# Patient Record
Sex: Female | Born: 1965 | Race: Black or African American | Hispanic: No | Smoking: Never smoker
Health system: Southern US, Community
[De-identification: ages and names within clinical notes are randomized; demographics above are authoritative.]

## PROBLEM LIST (undated history)

## (undated) ENCOUNTER — Ambulatory Visit: Disposition: A | Discharge status: Home / Self Care | Payer: Medicare HMO

## (undated) DIAGNOSIS — F32A Depression, unspecified: Secondary | ICD-10-CM

## (undated) DIAGNOSIS — T8859XA Other complications of anesthesia, initial encounter: Secondary | ICD-10-CM

## (undated) DIAGNOSIS — Z923 Personal history of irradiation: Secondary | ICD-10-CM

## (undated) DIAGNOSIS — K219 Gastro-esophageal reflux disease without esophagitis: Secondary | ICD-10-CM

## (undated) DIAGNOSIS — IMO0002 Reserved for concepts with insufficient information to code with codable children: Secondary | ICD-10-CM

## (undated) DIAGNOSIS — C539 Malignant neoplasm of cervix uteri, unspecified: Secondary | ICD-10-CM

## (undated) DIAGNOSIS — M329 Systemic lupus erythematosus, unspecified: Secondary | ICD-10-CM

## (undated) DIAGNOSIS — C50919 Malignant neoplasm of unspecified site of unspecified female breast: Secondary | ICD-10-CM

## (undated) DIAGNOSIS — M1990 Unspecified osteoarthritis, unspecified site: Secondary | ICD-10-CM

## (undated) DIAGNOSIS — R7303 Prediabetes: Secondary | ICD-10-CM

## (undated) DIAGNOSIS — I10 Essential (primary) hypertension: Secondary | ICD-10-CM

## (undated) DIAGNOSIS — C801 Malignant (primary) neoplasm, unspecified: Secondary | ICD-10-CM

## (undated) DIAGNOSIS — J45909 Unspecified asthma, uncomplicated: Secondary | ICD-10-CM

## (undated) DIAGNOSIS — F419 Anxiety disorder, unspecified: Secondary | ICD-10-CM

## (undated) DIAGNOSIS — T7840XA Allergy, unspecified, initial encounter: Secondary | ICD-10-CM

## (undated) HISTORY — PX: ABDOMINAL HYSTERECTOMY: SHX81

## (undated) HISTORY — PX: CHOLECYSTECTOMY: SHX55

## (undated) HISTORY — PX: REPLACEMENT TOTAL KNEE BILATERAL: SUR1225

## (undated) HISTORY — DX: Malignant neoplasm of unspecified site of unspecified female breast: C50.919

## (undated) HISTORY — PX: JOINT REPLACEMENT: SHX530

## (undated) HISTORY — PX: LAPAROSCOPIC GASTRIC BAND REMOVAL WITH LAPAROSCOPIC GASTRIC SLEEVE RESECTION: SHX6498

## (undated) HISTORY — DX: Essential (primary) hypertension: I10

## (undated) HISTORY — PX: TUBAL LIGATION: SHX77

## (undated) HISTORY — DX: Malignant neoplasm of cervix uteri, unspecified: C53.9

## (undated) HISTORY — DX: Allergy, unspecified, initial encounter: T78.40XA

## (undated) HISTORY — PX: COLONOSCOPY WITH ESOPHAGOGASTRODUODENOSCOPY (EGD): SHX5779

---

## 2016-06-28 DIAGNOSIS — G8929 Other chronic pain: Secondary | ICD-10-CM | POA: Insufficient documentation

## 2016-06-28 DIAGNOSIS — Z96653 Presence of artificial knee joint, bilateral: Secondary | ICD-10-CM | POA: Insufficient documentation

## 2016-06-28 DIAGNOSIS — R899 Unspecified abnormal finding in specimens from other organs, systems and tissues: Secondary | ICD-10-CM | POA: Insufficient documentation

## 2017-02-10 ENCOUNTER — Emergency Department: Payer: Medicare Other

## 2017-02-10 ENCOUNTER — Emergency Department
Admission: EM | Admit: 2017-02-10 | Discharge: 2017-02-10 | Disposition: A | Discharge status: Home / Self Care | Payer: Medicare Other | Attending: Emergency Medicine | Admitting: Emergency Medicine

## 2017-02-10 ENCOUNTER — Encounter: Payer: Self-pay | Admitting: Medical Oncology

## 2017-02-10 DIAGNOSIS — R51 Headache: Secondary | ICD-10-CM

## 2017-02-10 DIAGNOSIS — R519 Headache, unspecified: Secondary | ICD-10-CM

## 2017-02-10 DIAGNOSIS — Z79899 Other long term (current) drug therapy: Secondary | ICD-10-CM | POA: Insufficient documentation

## 2017-02-10 DIAGNOSIS — Z7982 Long term (current) use of aspirin: Secondary | ICD-10-CM | POA: Insufficient documentation

## 2017-02-10 DIAGNOSIS — I10 Essential (primary) hypertension: Secondary | ICD-10-CM | POA: Insufficient documentation

## 2017-02-10 LAB — BASIC METABOLIC PANEL
ANION GAP: 6 (ref 5–15)
BUN: 13 mg/dL (ref 6–20)
CALCIUM: 9.1 mg/dL (ref 8.9–10.3)
CO2: 31 mmol/L (ref 22–32)
Chloride: 103 mmol/L (ref 101–111)
Creatinine, Ser: 0.81 mg/dL (ref 0.44–1.00)
GFR calc Af Amer: >60 mL/min (ref >60)
GLUCOSE: 129 mg/dL — AB (ref 65–99)
POTASSIUM: 3.2 mmol/L — AB (ref 3.5–5.1)
SODIUM: 140 mmol/L (ref 135–145)

## 2017-02-10 LAB — CBC WITH DIFFERENTIAL/PLATELET
BASOS ABS: 0.1 10*3/uL (ref 0–0.1)
Basophils Relative: 1 %
EOS PCT: 3 %
Eosinophils Absolute: 0.2 10*3/uL (ref 0–0.7)
HCT: 38.1 % (ref 35.0–47.0)
Hemoglobin: 13.2 g/dL (ref 12.0–16.0)
Lymphocytes Relative: 46 %
Lymphs Abs: 2.9 10*3/uL (ref 1.0–3.6)
MCH: 30 pg (ref 26.0–34.0)
MCHC: 34.7 g/dL (ref 32.0–36.0)
MCV: 86.4 fL (ref 80.0–100.0)
MONO ABS: 0.4 10*3/uL (ref 0.2–0.9)
Monocytes Relative: 6 %
NEUTROS ABS: 2.8 10*3/uL (ref 1.4–6.5)
Neutrophils Relative %: 44 %
Platelets: 256 10*3/uL (ref 150–440)
RBC: 4.42 MIL/uL (ref 3.80–5.20)
RDW: 13.9 % (ref 11.5–14.5)
WBC: 6.3 10*3/uL (ref 3.6–11.0)

## 2017-02-10 LAB — TROPONIN I: Troponin I: <0.03 ng/mL (ref <0.03)

## 2017-02-10 MED ORDER — DIAZEPAM 5 MG PO TABS: 5.0000 mg | ORAL_TABLET | Freq: Once | ORAL | Status: DC

## 2017-02-10 MED ORDER — AMLODIPINE BESYLATE 5 MG PO TABS
5.0000 mg | ORAL_TABLET | Freq: Once | ORAL | Status: AC
Start: 2017-02-10 — End: 2017-02-10
  Administered 2017-02-10: 5 mg via ORAL
  Filled 2017-02-10: qty 1

## 2017-02-10 MED ORDER — AMLODIPINE BESYLATE 5 MG PO TABS: 5.0000 mg | ORAL_TABLET | Freq: Every day | ORAL | 0 refills | Status: DC

## 2017-02-10 NOTE — ED Notes (Signed)
This RN to bedside at this time to introduce self to patient. Pt is noted to be lying in bed with lights off. Pt is noted to be sinus brady on the monitor. Pt is noted to be alert and oriented, respirations even and unlabored, skin warm, dry, and intact. Pt requests warm blankets, which this RN provided to patient. Pt denies any further needs, will continue to monitor for further needs.

## 2017-02-10 NOTE — ED Notes (Addendum)
Pt states she is having tightness in her head and that she is currently having arthritic pain. Pt has no vision changes with headache at this time.

## 2017-02-10 NOTE — ED Notes (Signed)
NAD noted at time of D/C. Pt denies questions or concerns. Pt ambulatory to the lobby at this time.  

## 2017-02-10 NOTE — ED Notes (Signed)
Dr. Clearnce Hasten at bedside at this time. Will continue to monitor for further patient needs.

## 2017-02-10 NOTE — ED Notes (Addendum)
Patient states that she is new to the area and has an upcoming visit with a new PCP. Patient is on  Lasix 40, amlodipine 10, metoprolol 50, losartan 50.   Patient states that she has been out one of the medication (metoprolol).

## 2017-02-10 NOTE — ED Triage Notes (Signed)
Pt reports for the past few days she has been feeling "off" and like her BP was high. Pt reports home BP's have been 180's/100's. Pt reports headache.

## 2017-02-10 NOTE — ED Provider Notes (Signed)
[facility] Emergency Department Provider Note  ____________________________________________   None    (approximate)  I have reviewed the triage vital signs and the nursing notes.   HISTORY  Chief Complaint Headache and Hypertension   HPI Heather Mcintyre is a 51 y.o. female with a history of hypertension who is presenting to the emergency department with several days of intermittent headache as well as right shoulder cramping that started this morning. She denies any shortness of breath. Says that she is also been very stressed and has had similar symptoms in the past when she has been stressed as well as with high blood pressure. She says that she has been off her amlodipine for about 1 month now. She has recently moved and has not established primary care in the area yet. 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 She denies any chest pain or shortness of breath. Says that she was taking her blood pressure medication at home and was in the 180s over 100s.  Patient says the headache is now gone. However, one was present she said it wrapped from the back of her head around to the sides of her head bilaterally. No sudden onset or thunderclap quality. Does not report any weakness or numbness.   History reviewed. No pertinent past medical history.  There are no active problems to display for this patient.   History reviewed. No pertinent surgical history.  Prior to Admission medications   Medication Sig Start Date End Date Taking? Authorizing Provider  acetaminophen (TYLENOL) 500 MG tablet Take 500 mg by mouth every 6 (six) hours as needed.   Yes Historical Provider, MD  amLODipine (NORVASC) 10 MG tablet Take 10 mg by mouth daily.   Yes Historical Provider, MD  aspirin EC 81 MG tablet Take 81 mg by mouth daily.   Yes Historical Provider, MD  cetirizine (ZYRTEC) 10 MG  tablet Take 10 mg by mouth daily.   Yes Historical Provider, MD  diphenhydrAMINE (BENADRYL) 25 mg capsule Take 25 mg by mouth every 6 (six) hours as needed.   Yes Historical Provider, MD  fluticasone (FLONASE) 50 MCG/ACT nasal spray Place 1 spray into both nostrils daily.   Yes Historical Provider, MD  furosemide (LASIX) 40 MG tablet Take 40 mg by mouth daily as needed.   Yes Historical Provider, MD  gabapentin (NEURONTIN) 300 MG capsule Take 300 mg by mouth at bedtime. 06/28/16  Yes Historical Provider, MD  linaclotide (LINZESS) 290 MCG CAPS capsule Take 290 mcg by mouth daily before breakfast.   Yes Historical Provider, MD  losartan (COZAAR) 50 MG tablet Take 50 mg by mouth daily.   Yes Historical Provider, MD  metoprolol succinate (TOPROL-XL) 50 MG 24 hr tablet Take 50 mg by mouth daily. Take with or immediately following a meal.   Yes Historical Provider, MD  montelukast (SINGULAIR) 10 MG tablet Take 10 mg by mouth at bedtime.   Yes Historical Provider, MD  multivitamin-lutein (OCUVITE-LUTEIN) CAPS capsule Take 1 capsule by mouth daily.   Yes Historical Provider, MD  Psyllium (EQ DAILY FIBER PO) Take 1 tablet by mouth daily.   Yes Historical Provider, MD  tiZANidine (ZANAFLEX) 4 MG tablet Take 4 mg by mouth every 8 (eight) hours as needed for muscle spasms.   Yes Historical Provider, MD  traZODone (DESYREL) 100 MG tablet Take 100-300 mg by mouth at bedtime.  Yes Historical Provider, MD    Allergies Contrast media [iodinated diagnostic agents]; Keflet [cephalexin]; and Penicillins  No family history on file.  Social History Social History  Substance Use Topics  . Smoking status: Not on file  . Smokeless tobacco: Not on file  . Alcohol use Not on file    Review of Systems Constitutional: No fever/chills Eyes: No visual changes. ENT: No sore throat. Cardiovascular: Denies chest pain. Respiratory: Denies shortness of breath. Gastrointestinal: No abdominal pain.  No nausea, no  vomiting.  No diarrhea.  No constipation. Genitourinary: Negative for dysuria. Musculoskeletal: Negative for back pain. Skin: Negative for rash. Neurological: Negative for headaches, focal weakness or numbness.  10-point ROS otherwise negative.  ____________________________________________   PHYSICAL EXAM:  VITAL SIGNS: ED Triage Vitals  Enc Vitals Group     BP 02/10/17 0940 (!) 146/68     Pulse Rate 02/10/17 0940 61     Resp 02/10/17 0940 17     Temp 02/10/17 0940 98.1 F (36.7 C)     Temp Source 02/10/17 0940 Oral     SpO2 02/10/17 0940 99 %     Weight 02/10/17 0941 260 lb (117.9 kg)     Height 02/10/17 0941 5\' 8"  (1.727 m)     Head Circumference --      Peak Flow --      Pain Score 02/10/17 0941 7     Pain Loc --      Pain Edu? --      Excl. in [place]? --     Constitutional: Alert and oriented. Well appearing and in no acute distress. Eyes: Conjunctivae are normal. PERRL. EOMI. Head: Atraumatic. Nose: No congestion/rhinnorhea. Mouth/Throat: Mucous membranes are moist.   Neck: No stridor.   Cardiovascular: Normal rate, regular rhythm. Grossly normal heart sounds.   Respiratory: Normal respiratory effort.  No retractions. Lungs CTAB. Gastrointestinal: Soft and nontender. No distention.  Musculoskeletal: No lower extremity tenderness nor edema.  No joint effusions. Neurologic:  Normal speech and language. No gross focal neurologic deficits are appreciated.  Skin:  Skin is warm, dry and intact. No rash noted. Psychiatric: Mood and affect are normal. Speech and behavior are normal.  ____________________________________________   LABS (all labs ordered are listed, but only abnormal results are displayed)  Labs Reviewed  BASIC METABOLIC PANEL - Abnormal; Notable for the following:       Result Value   Potassium 3.2 (*)    Glucose, Bld 129 (*)    All other components within normal limits  CBC WITH DIFFERENTIAL/PLATELET  TROPONIN I    ____________________________________________  EKG  ED ECG REPORT I, Doran Stabler, the attending physician, personally viewed and interpreted this ECG.   Date: 02/10/2017  EKG Time: 1039  Rate: 50  Rhythm: normal sinus rhythm  Axis: Normal axis  Intervals:none  ST&T Change: No ST segment elevation or depression. No abnormal T-wave inversion.  ____________________________________________  [id number]  DG Chest 2 View (Final result)  Result time 02/10/17 10:51:47  Final result by Jacqulynn Cadet, MD (02/10/17 10:51:47)           Narrative:   CLINICAL DATA: 51 year old female with hypertension and headache  EXAM: CHEST 2 VIEW  COMPARISON: None.  FINDINGS: The lungs are clear and negative for focal airspace consolidation, pulmonary edema or suspicious pulmonary nodule. No pleural effusion or pneumothorax. Cardiac and mediastinal contours are within normal limits. No acute fracture or lytic or blastic osseous lesions. The visualized upper abdominal bowel gas pattern  is unremarkable.  IMPRESSION: Negative chest x-ray.   Electronically Signed By: Jacqulynn Cadet M.D. On: 02/10/2017 10:51            ____________________________________________   PROCEDURES  Procedure(s) performed:   Procedures  Critical Care performed:   ____________________________________________   INITIAL IMPRESSION / ASSESSMENT AND PLAN / ED COURSE  Pertinent labs & imaging results that were available during my care of the patient were reviewed by me and considered in my medical decision making (see chart for details).  ----------------------------------------- 12:11 PM on 02/10/2017 -----------------------------------------  Patient feels well. Reassuring lab work. Blood pressure in the 140s over 80s. We'll restart on her amlodipine and give [facility]. She is understanding of this plan and willing to comply. Does not need refills  of her medications.      ____________________________________________   FINAL CLINICAL IMPRESSION(S) / ED DIAGNOSES  Hypertension. Headache.    NEW MEDICATIONS STARTED DURING THIS VISIT:  New Prescriptions   No medications on file     Note:  This document was prepared using Dragon voice recognition software and may include unintentional dictation errors.    Orbie Pyo, MD 02/10/17 [phone]

## 2017-04-10 ENCOUNTER — Other Ambulatory Visit: Payer: Self-pay | Admitting: Family Medicine

## 2017-04-10 DIAGNOSIS — Z1231 Encounter for screening mammogram for malignant neoplasm of breast: Secondary | ICD-10-CM

## 2017-05-01 ENCOUNTER — Ambulatory Visit
Admission: RE | Admit: 2017-05-01 | Discharge: 2017-05-01 | Disposition: A | Discharge status: Home / Self Care | Payer: Medicare Other | Source: Ambulatory Visit | Attending: Family Medicine | Admitting: Family Medicine

## 2017-05-01 DIAGNOSIS — Z1231 Encounter for screening mammogram for malignant neoplasm of breast: Secondary | ICD-10-CM | POA: Diagnosis present

## 2017-05-08 ENCOUNTER — Inpatient Hospital Stay
Admission: RE | Admit: 2017-05-08 | Discharge: 2017-05-08 | Disposition: A | Discharge status: Home / Self Care | Payer: Self-pay | Source: Ambulatory Visit | Attending: *Deleted | Admitting: *Deleted

## 2017-05-08 ENCOUNTER — Other Ambulatory Visit: Payer: Self-pay | Admitting: *Deleted

## 2017-05-08 DIAGNOSIS — Z9289 Personal history of other medical treatment: Secondary | ICD-10-CM

## 2017-05-11 ENCOUNTER — Other Ambulatory Visit: Payer: Self-pay | Admitting: Family Medicine

## 2017-05-11 DIAGNOSIS — R928 Other abnormal and inconclusive findings on diagnostic imaging of breast: Secondary | ICD-10-CM

## 2017-05-11 DIAGNOSIS — N6310 Unspecified lump in the right breast, unspecified quadrant: Secondary | ICD-10-CM

## 2017-05-22 ENCOUNTER — Ambulatory Visit
Admission: RE | Admit: 2017-05-22 | Discharge: 2017-05-22 | Disposition: A | Discharge status: Home / Self Care | Payer: Medicare Other | Source: Ambulatory Visit | Attending: Family Medicine | Admitting: Family Medicine

## 2017-05-22 DIAGNOSIS — N6310 Unspecified lump in the right breast, unspecified quadrant: Secondary | ICD-10-CM

## 2017-05-22 DIAGNOSIS — R928 Other abnormal and inconclusive findings on diagnostic imaging of breast: Secondary | ICD-10-CM

## 2017-05-22 DIAGNOSIS — N6312 Unspecified lump in the right breast, upper inner quadrant: Secondary | ICD-10-CM | POA: Diagnosis not present

## 2017-08-02 DIAGNOSIS — I10 Essential (primary) hypertension: Secondary | ICD-10-CM | POA: Insufficient documentation

## 2017-08-02 DIAGNOSIS — R635 Abnormal weight gain: Secondary | ICD-10-CM | POA: Insufficient documentation

## 2017-08-25 ENCOUNTER — Emergency Department: Payer: Medicare Other

## 2017-08-25 ENCOUNTER — Emergency Department
Admission: EM | Admit: 2017-08-25 | Discharge: 2017-08-25 | Disposition: A | Discharge status: Home / Self Care | Payer: Medicare Other | Attending: Emergency Medicine | Admitting: Emergency Medicine

## 2017-08-25 DIAGNOSIS — M79674 Pain in right toe(s): Secondary | ICD-10-CM | POA: Diagnosis present

## 2017-08-25 DIAGNOSIS — Z79899 Other long term (current) drug therapy: Secondary | ICD-10-CM | POA: Diagnosis not present

## 2017-08-25 DIAGNOSIS — J45909 Unspecified asthma, uncomplicated: Secondary | ICD-10-CM | POA: Insufficient documentation

## 2017-08-25 DIAGNOSIS — M109 Gout, unspecified: Secondary | ICD-10-CM

## 2017-08-25 HISTORY — DX: Systemic lupus erythematosus, unspecified: M32.9

## 2017-08-25 HISTORY — DX: Unspecified asthma, uncomplicated: J45.909

## 2017-08-25 HISTORY — DX: Malignant (primary) neoplasm, unspecified: C80.1

## 2017-08-25 HISTORY — DX: Reserved for concepts with insufficient information to code with codable children: IMO0002

## 2017-08-25 HISTORY — DX: Unspecified osteoarthritis, unspecified site: M19.90

## 2017-08-25 LAB — URIC ACID: Uric Acid, Serum: 8.7 mg/dL — ABNORMAL HIGH (ref 2.3–6.6)

## 2017-08-25 MED ORDER — COLCHICINE 0.6 MG PO TABS
0.6000 mg | ORAL_TABLET | Freq: Once | ORAL | Status: AC
  Administered 2017-08-25: 0.6 mg via ORAL
  Filled 2017-08-25 (×2): qty 1

## 2017-08-25 MED ORDER — OXYCODONE-ACETAMINOPHEN 5-325 MG PO TABS: 1.0000 | ORAL_TABLET | Freq: Four times a day (QID) | ORAL | 0 refills | Status: DC | PRN

## 2017-08-25 MED ORDER — KETOROLAC TROMETHAMINE 60 MG/2ML IM SOLN
60.0000 mg | Freq: Once | INTRAMUSCULAR | Status: AC
  Administered 2017-08-25: 60 mg via INTRAMUSCULAR
  Filled 2017-08-25: qty 2

## 2017-08-25 MED ORDER — COLCHICINE 0.6 MG PO TABS
1.2000 mg | ORAL_TABLET | Freq: Once | ORAL | Status: AC
  Administered 2017-08-25: 1.2 mg via ORAL
  Filled 2017-08-25: qty 2

## 2017-08-25 NOTE — ED Provider Notes (Signed)
[facility] Emergency Department Provider Note   ____________________________________________   First MD Initiated Contact with Patient 08/25/17 [phone]     (approximate)  I have reviewed the triage vital signs and the nursing notes.   HISTORY  Chief Complaint Toe Pain    HPI Heather Mcintyre is a 51 y.o. female Who comes into the hospital today with some right toe pain. She reports it is throbbing and she does feel some numbness on both sides of her feet. The patient states this started yesterday. It might of been present the day before but she said it was really worse yesterday. She does not recall any injury to her toe. She reports though that sometimes when she gets up her legs are numb so it's possible she could've injured it. The patient has been taking her Tylenol as well as gabapentin. The pain is currently an 8 out of 10 in intensity. The patient has never had this before. She does have some problems with her feet or ankles and she may have felt her ankle crunch. The patient has arthritis. She's noticed some swelling of the right toe but is unable to tell if she's had some redness. The patient is here today for evaluation of her symptoms.   Past Medical History:  Diagnosis Date  . Arthritis   . Asthma   . Cancer ([place])   . Lupus     There are no active problems to display for this patient.   Past Surgical History:  Procedure Laterality Date  . ABDOMINAL HYSTERECTOMY      Prior to Admission medications   Medication Sig Start Date End Date Taking? Authorizing Provider  acetaminophen (TYLENOL) 500 MG tablet Take 500 mg by mouth every 6 (six) hours as needed.    [provider]  amLODipine (NORVASC) 5 MG tablet Take 1 tablet (5 mg total) by mouth daily. 02/10/17 02/10/18  Schaevitz, Randall An, MD  aspirin EC 81 MG tablet Take 81 mg by mouth daily.    [provider]  cetirizine (ZYRTEC) 10 MG tablet Take 10 mg by mouth daily.     [provider]  diphenhydrAMINE (BENADRYL) 25 mg capsule Take 25 mg by mouth every 6 (six) hours as needed.    [provider]  fluticasone (FLONASE) 50 MCG/ACT nasal spray Place 1 spray into both nostrils daily.    [provider]  furosemide (LASIX) 40 MG tablet Take 40 mg by mouth daily as needed.    [provider]  gabapentin (NEURONTIN) 300 MG capsule Take 300 mg by mouth at bedtime. 06/28/16   [provider]  linaclotide (LINZESS) 290 MCG CAPS capsule Take 290 mcg by mouth daily before breakfast.    [provider]  losartan (COZAAR) 50 MG tablet Take 50 mg by mouth daily.    [provider]  metoprolol succinate (TOPROL-XL) 50 MG 24 hr tablet Take 50 mg by mouth daily. Take with or immediately following a meal.    [provider]  montelukast (SINGULAIR) 10 MG tablet Take 10 mg by mouth at bedtime.    [provider]  multivitamin-lutein (OCUVITE-LUTEIN) CAPS capsule Take 1 capsule by mouth daily.    [provider]  Psyllium (EQ DAILY FIBER PO) Take 1 tablet by mouth daily.    [provider]  tiZANidine (ZANAFLEX) 4 MG tablet Take 4 mg by mouth every 8 (eight) hours as needed for muscle spasms.    [provider]  traZODone (DESYREL) 100 MG tablet Take 100-300 mg by mouth at bedtime.    [provider]    Allergies Contrast media [iodinated diagnostic agents]; Keflet [cephalexin]; and Penicillins  No family history on file.  Social History Social History  Substance Use Topics  . Smoking status: Never Smoker  . Smokeless tobacco: Not on file  . Alcohol use No    Review of Systems  Constitutional: No fever/chills Eyes: No visual changes. ENT: No sore throat. Cardiovascular: Denies chest pain. Respiratory: Denies shortness of breath. Gastrointestinal: No abdominal pain.  No nausea, no vomiting.  No diarrhea.  No constipation. Genitourinary: Negative for  dysuria. Musculoskeletal: right toe pain Skin: Negative for rash. Neurological: Negative for headaches, focal weakness or numbness.   ____________________________________________   PHYSICAL EXAM:  VITAL SIGNS: ED Triage Vitals  Enc Vitals Group     BP 08/25/17 0156 (!) 142/82     Pulse Rate 08/25/17 0156 63     Resp 08/25/17 0156 18     Temp 08/25/17 0156 99 F (37.2 C)     Temp Source 08/25/17 0156 Oral     SpO2 08/25/17 0156 100 %     Weight 08/25/17 0157 265 lb (120.2 kg)     Height 08/25/17 0157 5\' 8"  (1.727 m)     Head Circumference --      Peak Flow --      Pain Score 08/25/17 0156 8     Pain Loc --      Pain Edu? --      Excl. in [place]? --     Constitutional: Alert and oriented. Well appearing and in moderate distress. Eyes: Conjunctivae are normal. PERRL. EOMI. Head: Atraumatic. Nose: No congestion/rhinnorhea. Mouth/Throat: Mucous membranes are moist.  Oropharynx non-erythematous. Cardiovascular: Normal rate, regular rhythm. Grossly normal heart sounds.  Good peripheral circulation. Respiratory: Normal respiratory effort.  No retractions. Lungs CTAB. Gastrointestinal: Soft and nontender. No distention. positive bowel sounds Musculoskeletal: right toe pain to palpation and mild swelling to toe  Neurologic:  Normal speech and language.  Skin:  Skin is warm, dry and intact.  Psychiatric: Mood and affect are normal.   ____________________________________________   LABS (all labs ordered are listed, but only abnormal results are displayed)  Labs Reviewed  URIC ACID - Abnormal; Notable for the following:       Result Value   Uric Acid, Serum 8.7 (*)    All other components within normal limits   ____________________________________________  EKG  none ____________________________________________  RADIOLOGY  Dg Toe Great Right  Result Date: 08/25/2017 CLINICAL DATA:  51 y/o F; swelling and pain of the right great toe for several days. EXAM: RIGHT GREAT  TOE COMPARISON:  None. FINDINGS: There is no evidence of fracture or dislocation. There is no evidence of arthropathy or other focal bone abnormality. Soft tissues are unremarkable. IMPRESSION: Negative. Electronically Signed   By: Kristine Garbe M.D.   On: 08/25/2017 05:52    ____________________________________________   PROCEDURES  Procedure(s) performed: None  Procedures  Critical Care performed: No  ____________________________________________   INITIAL IMPRESSION / ASSESSMENT AND PLAN / ED COURSE  Pertinent labs & imaging results that were available during my care of the patient were reviewed by me and considered in my medical decision making (see chart for details).  this is a 51 year old female who comes into the hospital today with some right toe pain. My differential diagnosis includes gout versus fracture versus musculoskeletal pain.  I did check a uric acid  level and it was elevated. I will give the patient at shot of Toradol and a dose of colchicine. I'll repeat the dose in one hour and the patient will be discharged home to follow-up with her primary care physician.      ____________________________________________   FINAL CLINICAL IMPRESSION(S) / ED DIAGNOSES  Final diagnoses:  Acute gout involving toe of right foot, unspecified cause      NEW MEDICATIONS STARTED DURING THIS VISIT:  New Prescriptions   No medications on file     Note:  This document was prepared using Dragon voice recognition software and may include unintentional dictation errors.    Loney Hering, MD 08/25/17 [phone]

## 2017-08-25 NOTE — ED Notes (Signed)
Pt asked for pain medication. MD made aware.

## 2017-08-25 NOTE — Discharge Instructions (Signed)
Please follow up with your primary care physician.

## 2017-08-25 NOTE — ED Triage Notes (Signed)
Patient presents with right great toe pain. Denies injury and states it has been present for about two days. Difficulty putting on her shoes due to pain.

## 2017-11-01 ENCOUNTER — Emergency Department
Admission: EM | Admit: 2017-11-01 | Discharge: 2017-11-01 | Disposition: A | Discharge status: Home / Self Care | Payer: Medicare Other | Attending: Emergency Medicine | Admitting: Emergency Medicine

## 2017-11-01 ENCOUNTER — Emergency Department: Payer: Medicare Other

## 2017-11-01 DIAGNOSIS — Y929 Unspecified place or not applicable: Secondary | ICD-10-CM | POA: Insufficient documentation

## 2017-11-01 DIAGNOSIS — S93401A Sprain of unspecified ligament of right ankle, initial encounter: Secondary | ICD-10-CM | POA: Diagnosis not present

## 2017-11-01 DIAGNOSIS — Z79899 Other long term (current) drug therapy: Secondary | ICD-10-CM | POA: Diagnosis not present

## 2017-11-01 DIAGNOSIS — Z7982 Long term (current) use of aspirin: Secondary | ICD-10-CM | POA: Insufficient documentation

## 2017-11-01 DIAGNOSIS — W010XXA Fall on same level from slipping, tripping and stumbling without subsequent striking against object, initial encounter: Secondary | ICD-10-CM | POA: Insufficient documentation

## 2017-11-01 DIAGNOSIS — Y999 Unspecified external cause status: Secondary | ICD-10-CM | POA: Diagnosis not present

## 2017-11-01 DIAGNOSIS — Y9301 Activity, walking, marching and hiking: Secondary | ICD-10-CM | POA: Diagnosis not present

## 2017-11-01 DIAGNOSIS — J45909 Unspecified asthma, uncomplicated: Secondary | ICD-10-CM | POA: Diagnosis not present

## 2017-11-01 DIAGNOSIS — S63501A Unspecified sprain of right wrist, initial encounter: Secondary | ICD-10-CM | POA: Insufficient documentation

## 2017-11-01 DIAGNOSIS — S6991XA Unspecified injury of right wrist, hand and finger(s), initial encounter: Secondary | ICD-10-CM | POA: Diagnosis present

## 2017-11-01 MED ORDER — ACETAMINOPHEN 325 MG PO TABS
650.0000 mg | ORAL_TABLET | Freq: Once | ORAL | Status: AC
  Administered 2017-11-01: 650 mg via ORAL
  Filled 2017-11-01: qty 2

## 2017-11-01 NOTE — ED Triage Notes (Signed)
Patient reports mechanical fall earlier today. Patient denies dizziness/LOC.  Patient c/o right wrist pain and right ankle pain.  Patient reports bilateral knee pain, however reports chronic pain in knees.

## 2017-11-01 NOTE — Discharge Instructions (Signed)
Your exam and x-rays are negative for fracture following your fall. Take your home Celebrex as needed for pain and inflammation. Apply ice to reduce pain and swelling. Follow-up with Dr. Clide Deutscher as needed.

## 2017-11-01 NOTE — ED Provider Notes (Signed)
[facility] Emergency Department Provider Note ____________________________________________  Time seen: 2117  I have reviewed the triage vital signs and the nursing notes.  HISTORY  Chief Complaint  Fall  HPI Heather Mcintyre is a 51 y.o. female presents to the ED for evaluation of injury sustained following a mechanical fall earlier this afternoon.  The patient describes she was walking and apparently missed a step when she fell landing on her knees and extended right hand.  She denies any head injury or loss of consciousness.  She was able to get up under her own power, and continued to run her errands prior to presenting herself to the ED. now notes increased swelling to the right hand and wrist as well as resolved right ankle pain.  Past Medical History:  Diagnosis Date  . Arthritis   . Asthma   . Cancer ([place])   . Lupus     There are no active problems to display for this patient.   Past Surgical History:  Procedure Laterality Date  . ABDOMINAL HYSTERECTOMY      Prior to Admission medications   Medication Sig Start Date End Date Taking? Authorizing Provider  acetaminophen (TYLENOL) 500 MG tablet Take 500 mg by mouth every 6 (six) hours as needed.    [provider]  amLODipine (NORVASC) 5 MG tablet Take 1 tablet (5 mg total) by mouth daily. 02/10/17 02/10/18  Schaevitz, Randall An, MD  aspirin EC 81 MG tablet Take 81 mg by mouth daily.    [provider]  cetirizine (ZYRTEC) 10 MG tablet Take 10 mg by mouth daily.    [provider]  diphenhydrAMINE (BENADRYL) 25 mg capsule Take 25 mg by mouth every 6 (six) hours as needed.    [provider]  fluticasone (FLONASE) 50 MCG/ACT nasal spray Place 1 spray into both nostrils daily.    [provider]  furosemide (LASIX) 40 MG tablet Take 40 mg by mouth daily as needed.    [provider]  gabapentin (NEURONTIN) 300 MG capsule Take 300 mg by mouth at  bedtime. 06/28/16   [provider]  linaclotide (LINZESS) 290 MCG CAPS capsule Take 290 mcg by mouth daily before breakfast.    [provider]  losartan (COZAAR) 50 MG tablet Take 50 mg by mouth daily.    [provider]  metoprolol succinate (TOPROL-XL) 50 MG 24 hr tablet Take 50 mg by mouth daily. Take with or immediately following a meal.    [provider]  montelukast (SINGULAIR) 10 MG tablet Take 10 mg by mouth at bedtime.    [provider]  multivitamin-lutein (OCUVITE-LUTEIN) CAPS capsule Take 1 capsule by mouth daily.    [provider]  oxyCODONE-acetaminophen (ROXICET) 5-325 MG tablet Take 1 tablet by mouth every 6 (six) hours as needed. 08/25/17   Loney Hering, MD  Psyllium (EQ DAILY FIBER PO) Take 1 tablet by mouth daily.    [provider]  tiZANidine (ZANAFLEX) 4 MG tablet Take 4 mg by mouth every 8 (eight) hours as needed for muscle spasms.    [provider]  traZODone (DESYREL) 100 MG tablet Take 100-300 mg by mouth at bedtime.    [provider]    Allergies Contrast media [iodinated diagnostic agents]; Keflet [cephalexin]; and Penicillins  No family history on file.  Social History Social History   Tobacco Use  . Smoking status: Never Smoker  . Smokeless tobacco: Never Used  Substance Use Topics  .  Alcohol use: No  . Drug use: Not on file    Review of Systems  Constitutional: Negative for fever. Eyes: Negative for visual changes. ENT: Negative for sore throat. Cardiovascular: Negative for chest pain. Respiratory: Negative for shortness of breath. Gastrointestinal: Negative for abdominal pain, vomiting and diarrhea. Genitourinary: Negative for dysuria. Musculoskeletal: Negative for back pain. Right wrist and ankle pain as above.  Skin: Negative for rash. Neurological: Negative for headaches, focal weakness or  numbness. ____________________________________________  PHYSICAL EXAM:  VITAL SIGNS: ED Triage Vitals [11/01/17 1945]  Enc Vitals Group     BP (!) 159/97     Pulse Rate (!) 57     Resp 19     Temp 98.8 F (37.1 C)     Temp Source Oral     SpO2 100 %     Weight 265 lb (120.2 kg)     Height      Head Circumference      Peak Flow      Pain Score 8     Pain Loc      Pain Edu?      Excl. in [place]?     Constitutional: Alert and oriented. Well appearing and in no distress. Head: Normocephalic and atraumatic. Eyes: Conjunctivae are normal. PERRL. Normal extraocular movements Neck: Supple. No thyromegaly. Cardiovascular: Normal rate, regular rhythm. Normal distal pulses. Respiratory: Normal respiratory effort. No wheezes/rales/rhonchi. Gastrointestinal: Soft and nontender. No distention. Musculoskeletal: Right hand with dorsal edema noted.  No laceration, abrasion, or deformity is appreciated.  Patient with normal composite fist bilaterally.  She is tender palpation over the right CMC joint.  Nontender with normal range of motion in all extremities.  Neurologic:  Normal gait without ataxia. Normal speech and language. No gross focal neurologic deficits are appreciated. Skin:  Skin is warm, dry and intact. No rash noted. ____________________________________________   RADIOLOGY  Right Wrist   FINDINGS: There is no evidence of fracture or dislocation. Osteoarthritic changes at the first carpometacarpal and metacarpophalangeal joints. Soft tissues are unremarkable.  IMPRESSION: No acute fracture or dislocation identified about the right wrist.  Right Ankle  IMPRESSION: No acute fracture or dislocation identified about the right Ankle. ____________________________________________  PROCEDURES  Procedures Tylenol 650 mg PO Ace bandage - right wrist - applied by Lakelynn Severtson, PA-C ____________________________________________  INITIAL IMPRESSION / ASSESSMENT AND PLAN / ED  COURSE  Patient with ED evaluation of injuries fall this afternoon.  Patient presents with right hand pain and swelling.  She has significant underlying OA of the right hand.  Her exam does not reveal any acute laceration or deformity.  X-rays reassurance that shows no fractures.  She is discharged after an Ace bandage is placed on the right wrist with instructions to dose her home Celebrex for pain relief.  She will follow-up with her primary care provider or Bobette Mo for ongoing symptoms. ____________________________________________  FINAL CLINICAL IMPRESSION(S) / ED DIAGNOSES  Final diagnoses:  Wrist sprain, right, initial encounter  Sprain of right ankle, unspecified ligament, initial encounter      Melvenia Needles, PA-C 11/01/17 2255    Nance Pear, MD 11/01/17 2308

## 2017-11-01 NOTE — ED Notes (Signed)
Pt has swelling to right wrist.  Pt states she missed a step and fell today at noon.  Pt has good pulses and sensation to right hand.  Pt alert.

## 2018-01-17 ENCOUNTER — Other Ambulatory Visit: Payer: Self-pay | Admitting: Family Medicine

## 2018-01-17 DIAGNOSIS — R928 Other abnormal and inconclusive findings on diagnostic imaging of breast: Secondary | ICD-10-CM

## 2018-02-07 ENCOUNTER — Ambulatory Visit
Admission: RE | Admit: 2018-02-07 | Discharge: 2018-02-07 | Disposition: A | Discharge status: Home / Self Care | Payer: Medicare Other | Source: Ambulatory Visit | Attending: Family Medicine | Admitting: Family Medicine

## 2018-02-07 DIAGNOSIS — R928 Other abnormal and inconclusive findings on diagnostic imaging of breast: Secondary | ICD-10-CM

## 2019-01-15 DIAGNOSIS — M2141 Flat foot [pes planus] (acquired), right foot: Secondary | ICD-10-CM | POA: Insufficient documentation

## 2019-01-15 DIAGNOSIS — M5416 Radiculopathy, lumbar region: Secondary | ICD-10-CM | POA: Insufficient documentation

## 2019-01-15 DIAGNOSIS — M2142 Flat foot [pes planus] (acquired), left foot: Secondary | ICD-10-CM | POA: Insufficient documentation

## 2019-02-03 ENCOUNTER — Other Ambulatory Visit: Payer: Self-pay | Admitting: Family Medicine

## 2019-02-03 DIAGNOSIS — Z1231 Encounter for screening mammogram for malignant neoplasm of breast: Secondary | ICD-10-CM

## 2019-02-04 DIAGNOSIS — G8929 Other chronic pain: Secondary | ICD-10-CM | POA: Insufficient documentation

## 2019-02-04 DIAGNOSIS — M545 Low back pain, unspecified: Secondary | ICD-10-CM | POA: Insufficient documentation

## 2019-08-28 ENCOUNTER — Other Ambulatory Visit: Payer: Self-pay | Admitting: Family Medicine

## 2019-08-28 DIAGNOSIS — Z1231 Encounter for screening mammogram for malignant neoplasm of breast: Secondary | ICD-10-CM

## 2020-08-09 ENCOUNTER — Other Ambulatory Visit: Payer: Self-pay | Admitting: Family Medicine

## 2020-08-09 DIAGNOSIS — R102 Pelvic and perineal pain unspecified side: Secondary | ICD-10-CM

## 2020-08-17 ENCOUNTER — Other Ambulatory Visit: Payer: Self-pay

## 2020-08-17 ENCOUNTER — Ambulatory Visit
Admission: RE | Admit: 2020-08-17 | Discharge: 2020-08-17 | Disposition: A | Discharge status: Home / Self Care | Payer: Medicare HMO | Source: Ambulatory Visit | Attending: Family Medicine | Admitting: Family Medicine

## 2020-08-17 DIAGNOSIS — R102 Pelvic and perineal pain: Secondary | ICD-10-CM | POA: Insufficient documentation

## 2020-09-23 ENCOUNTER — Emergency Department
Admission: EM | Admit: 2020-09-23 | Discharge: 2020-09-23 | Disposition: A | Discharge status: Home / Self Care | Payer: Medicare HMO | Attending: Emergency Medicine | Admitting: Emergency Medicine

## 2020-09-23 ENCOUNTER — Encounter: Payer: Self-pay | Admitting: Emergency Medicine

## 2020-09-23 ENCOUNTER — Other Ambulatory Visit: Payer: Self-pay

## 2020-09-23 ENCOUNTER — Emergency Department: Payer: Medicare HMO

## 2020-09-23 DIAGNOSIS — J45909 Unspecified asthma, uncomplicated: Secondary | ICD-10-CM | POA: Insufficient documentation

## 2020-09-23 DIAGNOSIS — M5431 Sciatica, right side: Secondary | ICD-10-CM

## 2020-09-23 DIAGNOSIS — S39012A Strain of muscle, fascia and tendon of lower back, initial encounter: Secondary | ICD-10-CM | POA: Diagnosis not present

## 2020-09-23 DIAGNOSIS — M5442 Lumbago with sciatica, left side: Secondary | ICD-10-CM | POA: Insufficient documentation

## 2020-09-23 DIAGNOSIS — S3992XA Unspecified injury of lower back, initial encounter: Secondary | ICD-10-CM | POA: Diagnosis present

## 2020-09-23 MED ORDER — MELOXICAM 7.5 MG PO TABS
15.0000 mg | ORAL_TABLET | Freq: Once | ORAL | Status: AC
  Administered 2020-09-23: 15 mg via ORAL
  Filled 2020-09-23: qty 2

## 2020-09-23 MED ORDER — METHOCARBAMOL 500 MG PO TABS: 500.0000 mg | ORAL_TABLET | Freq: Four times a day (QID) | ORAL | 0 refills | Status: DC

## 2020-09-23 MED ORDER — MELOXICAM 15 MG PO TABS: 15.0000 mg | ORAL_TABLET | Freq: Every day | ORAL | 0 refills | Status: DC
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
## 2021-02-23 DIAGNOSIS — K219 Gastro-esophageal reflux disease without esophagitis: Secondary | ICD-10-CM | POA: Insufficient documentation

## 2021-02-23 DIAGNOSIS — R601 Generalized edema: Secondary | ICD-10-CM | POA: Insufficient documentation

## 2021-04-27 DIAGNOSIS — M25512 Pain in left shoulder: Secondary | ICD-10-CM | POA: Insufficient documentation

## 2022-02-15 ENCOUNTER — Ambulatory Visit (INDEPENDENT_AMBULATORY_CARE_PROVIDER_SITE_OTHER): Payer: Medicare HMO | Admitting: Urology

## 2022-02-15 ENCOUNTER — Encounter: Payer: Self-pay | Admitting: Urology

## 2022-02-15 ENCOUNTER — Other Ambulatory Visit: Payer: Self-pay

## 2022-02-15 VITALS — BP 146/91 | HR 80 | Ht 68.0 in | Wt 272.0 lb

## 2022-02-15 DIAGNOSIS — R3129 Other microscopic hematuria: Secondary | ICD-10-CM

## 2022-02-15 NOTE — Progress Notes (Signed)
? ?02/15/2022 ?10:20 AM  ? ?Heather Mcintyre ?1966/08/14 ?[id number] ? ?Referring provider: Donnie Coffin, MD ?[address] ?[place],  [place] [address] ? ?Chief Complaint  ?Patient presents with  ? Hematuria  ? ? ?HPI: ?Heather Mcintyre is a 56 y.o. female referred for evaluation of hematuria. ? ?Relates to a long history of microscopic hematuria ?States she had a urologic evaluation 8-10 years ago however does not remember the extent of her evaluation ?No previous smoking history ?Denies gross hematuria, dysuria ?No flank, abdominal or pelvic pain ?No possible LUTS ?UA PCP 12/30/2021 with 3-10 RBCs and no significant epis ? ? ?PMH: ?Past Medical History:  ?Diagnosis Date  ? Asthma   ? Cervical cancer ([place])   ? Degenerative joint disease/osteoarthritis   ? Hypertension   ? Lupus ([place])   ? ? ?Surgical History: ?Past Surgical History:  ?Procedure Laterality Date  ? ABDOMINAL HYSTERECTOMY    ? CHOLECYSTECTOMY N/A   ? LAPAROSCOPIC GASTRIC BAND REMOVAL WITH LAPAROSCOPIC GASTRIC SLEEVE RESECTION    ? REPLACEMENT TOTAL KNEE BILATERAL    ? TUBAL LIGATION    ? ? ?Home Medications:  ?Allergies as of 02/15/2022   ? ?   Reactions  ? Latex Hives  ? Contrast Media [iodinated Contrast Media]   ? Keflet [cephalexin]   ? Penicillins   ? ?  ? ?  ?Medication List  ?  ? ?  ? Accurate as of February 15, 2022 10:20 AM. If you have any questions, ask your nurse or doctor.  ?  ?  ? ?  ? ?STOP taking these medications   ? ?meloxicam 15 MG tablet ?Commonly known as: MOBIC ?Stopped by: Abbie Sons, MD ?  ?methocarbamol 500 MG tablet ?Commonly known as: Robaxin ?Stopped by: Abbie Sons, MD ?  ?oxyCODONE-acetaminophen 5-325 MG tablet ?Commonly known as: Roxicet ?Stopped by: Abbie Sons, MD ?  ? ?  ? ?TAKE these medications   ? ?acetaminophen 500 MG tablet ?Commonly known as: TYLENOL ?Take 500 mg by mouth every 6 (six) hours as needed. ?  ?amLODipine 5 MG tablet ?Commonly known as: NORVASC ?Take 1 tablet (5 mg total) by mouth  daily. ?  ?aspirin EC 81 MG tablet ?Take 81 mg by mouth daily. ?  ?cetirizine 10 MG tablet ?Commonly known as: ZYRTEC ?Take 10 mg by mouth daily. ?  ?diphenhydrAMINE 25 mg capsule ?Commonly known as: BENADRYL ?Take 25 mg by mouth every 6 (six) hours as needed. ?  ?EQ DAILY FIBER PO ?Take 1 tablet by mouth daily. ?  ?fluticasone 50 MCG/ACT nasal spray ?Commonly known as: FLONASE ?Place 1 spray into both nostrils daily. ?  ?furosemide 40 MG tablet ?Commonly known as: LASIX ?Take 40 mg by mouth daily as needed. ?  ?gabapentin 300 MG capsule ?Commonly known as: NEURONTIN ?Take 300 mg by mouth at bedtime. ?  ?linaclotide 290 MCG Caps capsule ?Commonly known as: LINZESS ?Take 290 mcg by mouth daily before breakfast. ?  ?losartan 50 MG tablet ?Commonly known as: COZAAR ?Take 50 mg by mouth daily. ?  ?metoprolol succinate 50 MG 24 hr tablet ?Commonly known as: TOPROL-XL ?Take 50 mg by mouth daily. Take with or immediately following a meal. ?  ?montelukast 10 MG tablet ?Commonly known as: SINGULAIR ?Take 10 mg by mouth at bedtime. ?  ?multivitamin-lutein Caps capsule ?Take 1 capsule by mouth daily. ?  ?omeprazole 40 MG capsule ?Commonly known as: PRILOSEC ?  ?tiZANidine 4 MG tablet ?Commonly known as:  ZANAFLEX ?Take 4 mg by mouth every 8 (eight) hours as needed for muscle spasms. ?  ?traZODone 100 MG tablet ?Commonly known as: DESYREL ?Take 100-300 mg by mouth at bedtime. ?  ? ?  ? ? ?Allergies:  ?Allergies  ?Allergen Reactions  ? Latex Hives  ? Contrast Media [Iodinated Contrast Media]   ? Keflet [Cephalexin]   ? Penicillins   ? ? ?Family History: ?Family History  ?Problem Relation Age of Onset  ? Breast cancer Neg Hx   ? ? ?Social History:  reports that she has never smoked. She has never used smokeless tobacco. She reports that she does not drink alcohol. No history on file for drug use. ? ? ?Physical Exam: ?BP (!) 146/91   Pulse 80   Ht '5\' 8"'$  (1.727 m)   Wt 272 lb (123.4 kg)   BMI 41.36 kg/m?   ?Constitutional:   Alert and oriented, No acute distress. ?HEENT: [place] AT, moist mucus membranes.  Trachea midline, no masses. ?Cardiovascular: No clubbing, cyanosis, or edema. ?Respiratory: Normal respiratory effort, no increased work of breathing. ?Psychiatric: Normal mood and affect. ? ?Laboratory Data: ? ?Urinalysis ?Micro: 3-10 RBC; >10 epis ? ? ?Assessment & Plan:   ? ?1.  Microhematuria ?AUA hematuria risk stratification: Intermediate ?We discussed the recommended evaluation for intermediate risk hematuria which includes a renal ultrasound and cystoscopy ?The procedures were discussed in detail and she has elected to proceed with further evaluation ?RUS order placed and cystoscopy was scheduled ? ? ?Abbie Sons, MD ? ?[facility] ?[address] ?[place], [place] [address] ?[phone] ?

## 2022-02-16 LAB — URINALYSIS, COMPLETE
Bilirubin, UA: NEGATIVE
Glucose, UA: NEGATIVE
Ketones, UA: NEGATIVE
Leukocytes,UA: NEGATIVE
Nitrite, UA: NEGATIVE
Protein,UA: NEGATIVE
Specific Gravity, UA: >1.03 — ABNORMAL HIGH (ref 1.005–1.030)
Urobilinogen, Ur: 0.2 mg/dL (ref 0.2–1.0)
pH, UA: 6 (ref 5.0–7.5)

## 2022-02-16 LAB — MICROSCOPIC EXAMINATION: Epithelial Cells (non renal): >10 /hpf — AB (ref 0–10)

## 2022-03-27 ENCOUNTER — Ambulatory Visit: Admission: RE | Admit: 2022-03-27 | Payer: Medicare HMO | Source: Ambulatory Visit

## 2022-03-30 ENCOUNTER — Other Ambulatory Visit: Payer: Medicare HMO | Admitting: Urology

## 2022-03-30 ENCOUNTER — Encounter: Payer: Self-pay | Admitting: Urology

## 2022-04-04 ENCOUNTER — Ambulatory Visit
Admission: RE | Admit: 2022-04-04 | Discharge: 2022-04-04 | Disposition: A | Discharge status: Home / Self Care | Payer: Medicare HMO | Source: Ambulatory Visit | Attending: Urology | Admitting: Urology

## 2022-04-04 DIAGNOSIS — R3129 Other microscopic hematuria: Secondary | ICD-10-CM | POA: Insufficient documentation

## 2022-04-06 ENCOUNTER — Telehealth: Payer: Self-pay | Admitting: *Deleted

## 2022-04-06 NOTE — Telephone Encounter (Signed)
Notified patient as instructed, patient pleased. Patient will call us back to schedule csysto.  ?

## 2022-04-06 NOTE — Telephone Encounter (Signed)
-----   Message from Abbie Sons, MD sent at 04/05/2022  7:06 AM EDT ----- ?Renal ultrasound showed no abnormalities.  Cystoscopy was recommended at prior office visit as part of a hematuria evaluation.  It does not look like it has been scheduled. ?

## 2022-05-22 DIAGNOSIS — M533 Sacrococcygeal disorders, not elsewhere classified: Secondary | ICD-10-CM | POA: Insufficient documentation

## 2022-05-22 DIAGNOSIS — M4316 Spondylolisthesis, lumbar region: Secondary | ICD-10-CM | POA: Insufficient documentation

## 2022-05-22 DIAGNOSIS — M47816 Spondylosis without myelopathy or radiculopathy, lumbar region: Secondary | ICD-10-CM | POA: Insufficient documentation

## 2022-06-09 LAB — LAB REPORT - SCANNED
A1c: 5.8
EGFR: 70

## 2022-06-26 IMAGING — US US PELVIS COMPLETE WITH TRANSVAGINAL
1 series · 14 of 25 positions shown · non-contrast
Comparison: None available.

CLINICAL DATA: Initial evaluation for acute pelvic pain for 1
month. History of prior hysterectomy.

EXAM:
TRANSABDOMINAL AND TRANSVAGINAL ULTRASOUND OF PELVIS
TECHNIQUE: Both transabdominal and transvaginal ultrasound examinations of the
pelvis were performed. Transabdominal technique was performed for
global imaging of the pelvis including uterus, ovaries, adnexal
regions, and pelvic cul-de-sac. It was necessary to proceed with
endovaginal exam following the transabdominal exam to visualize the
pelvic structures.

[Series 1: us pelvis complete with transvaginal · 0.24mm/px · 14 of 62 slices shown]
[im 1/62]
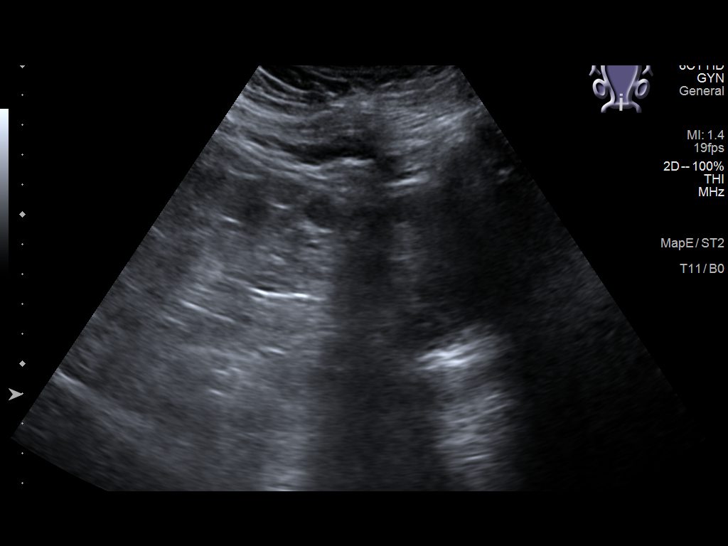
[im 6/62]
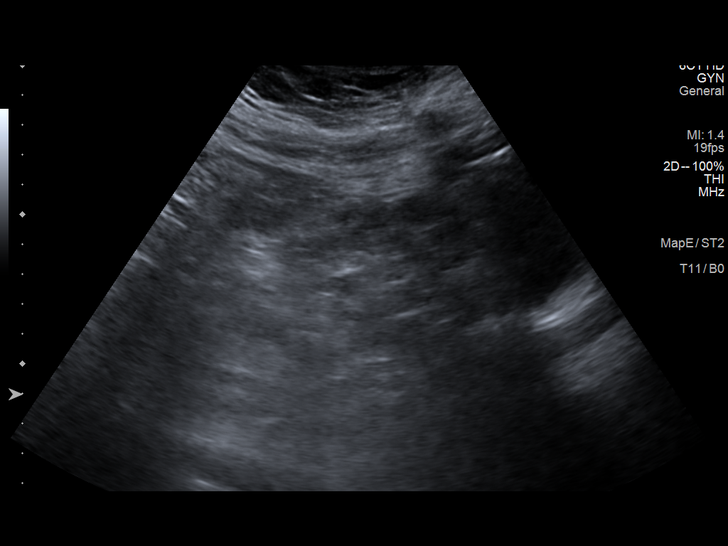
[im 11/62]
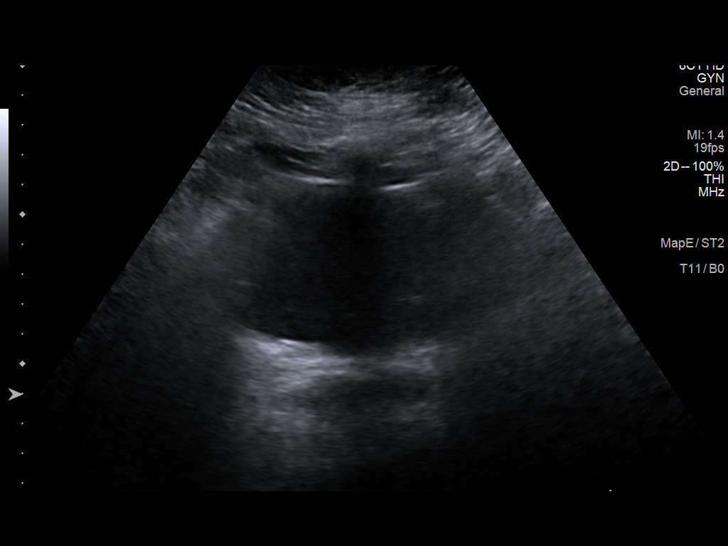
[im 16/62]
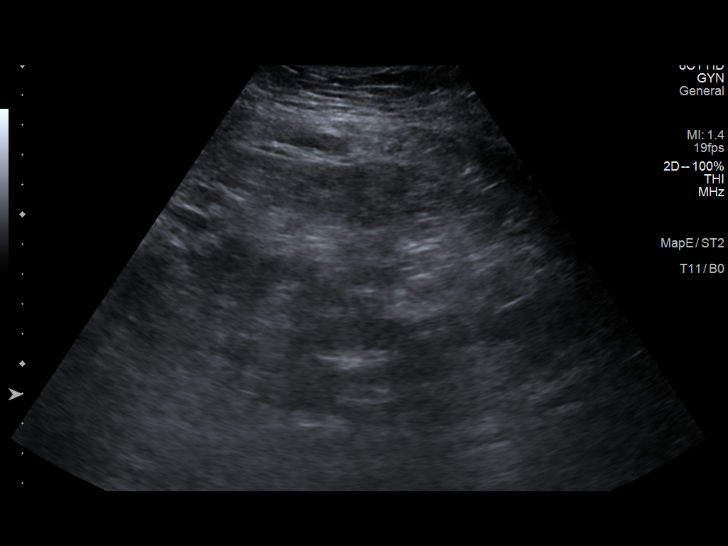
[im 21/62]
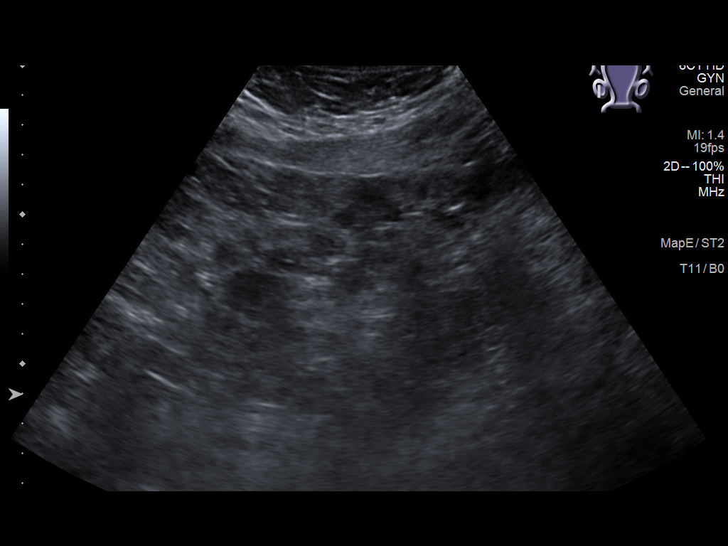
[im 23/62]
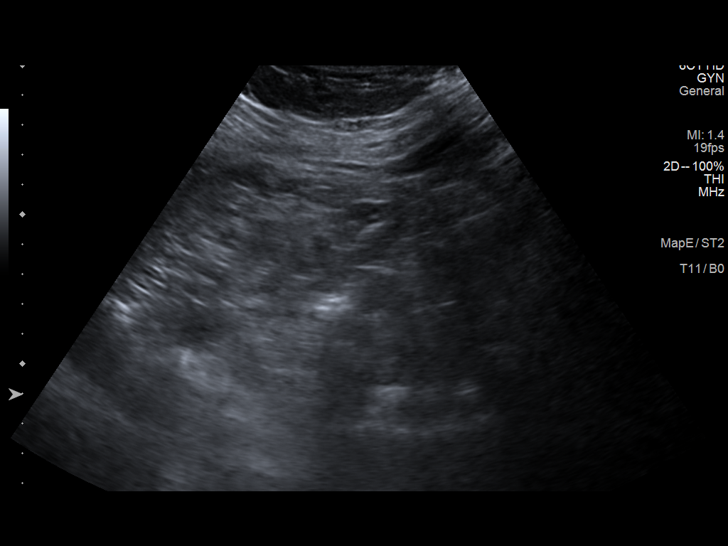
[im 28/62]
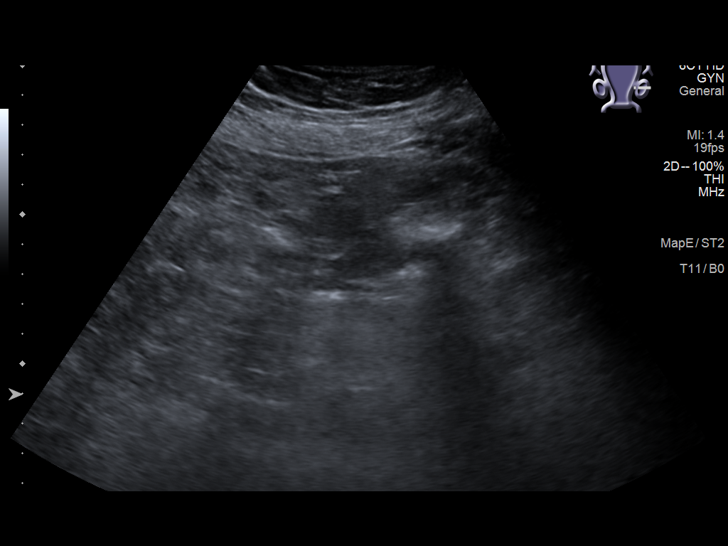
[im 34/62]
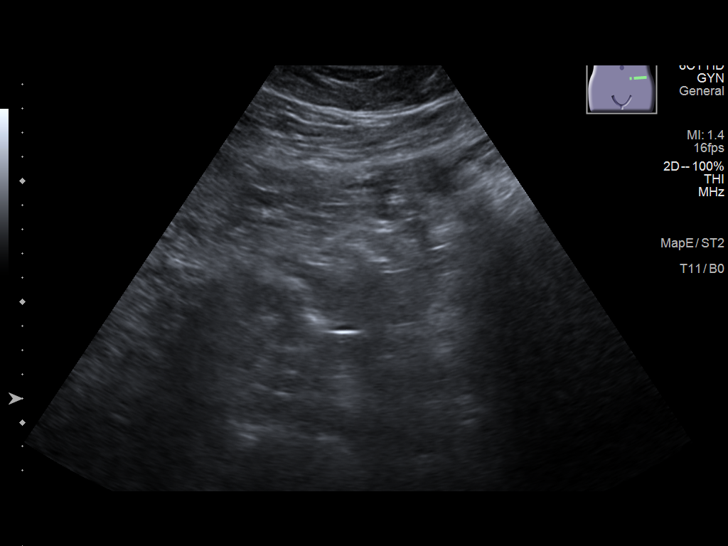
[im 39/62]
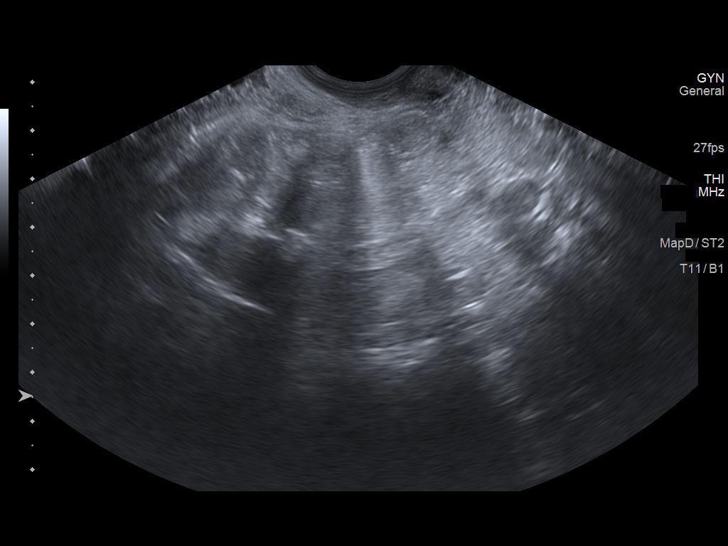
[im 41/62]
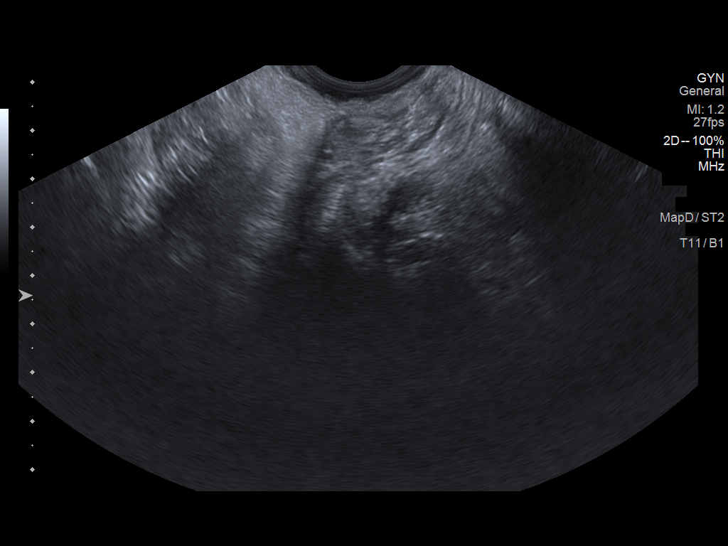
[im 46/62]
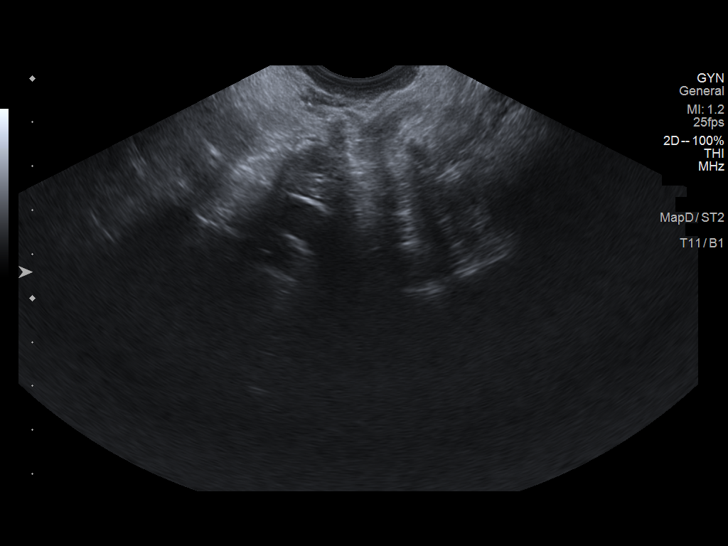
[im 51/62]
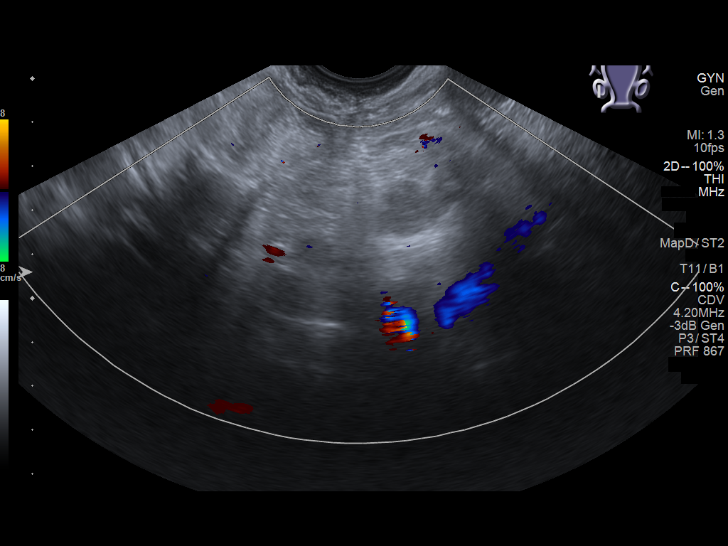
[im 56/62]
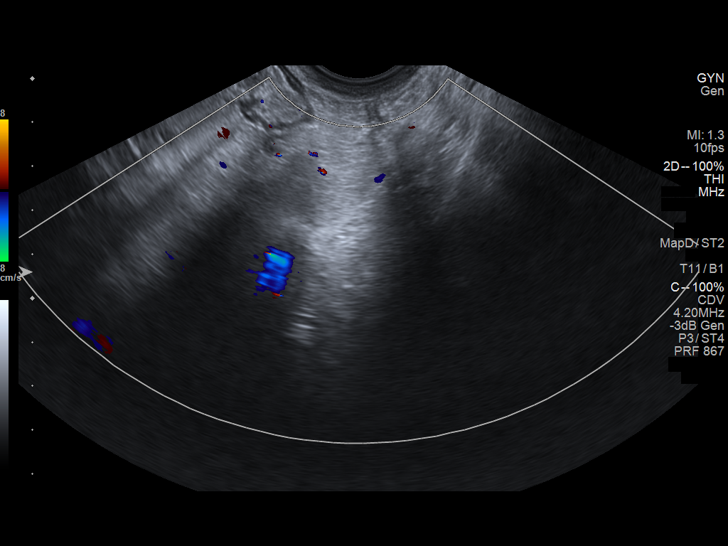
[im 62/62]
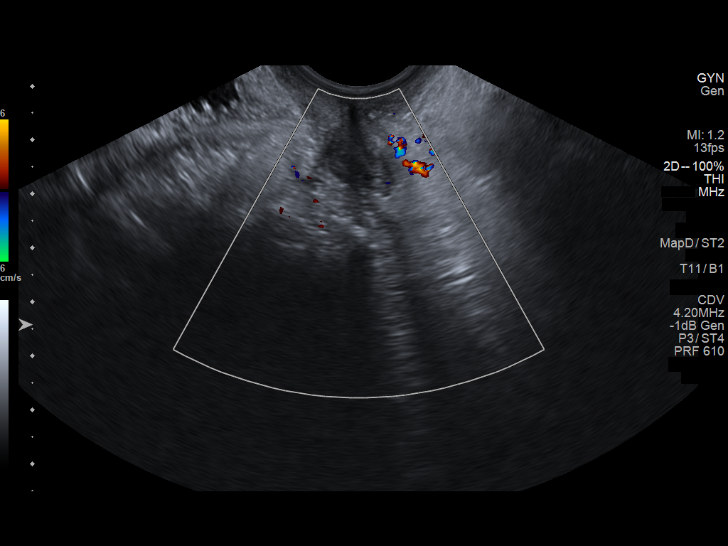

[14 of 25 positions shown; findings below may reference images not displayed]

FINDINGS: Uterus

Prior hysterectomy.  No abnormality about the vaginal cuff

Endometrium

Surgically absent.

Right ovary

Not visualized.  No adnexal mass.

Left ovary

Not visualized.  No adnexal mass.

Other findings

No abnormal free fluid.
IMPRESSION: 1. Prior hysterectomy with nonvisualization of the uterus or
ovaries.
2. No pelvic or adnexal mass or abnormal free fluid. No findings to
explain patient's symptoms identified.

## 2022-08-21 ENCOUNTER — Other Ambulatory Visit: Payer: Self-pay | Admitting: Family Medicine

## 2022-08-21 DIAGNOSIS — Z1231 Encounter for screening mammogram for malignant neoplasm of breast: Secondary | ICD-10-CM

## 2022-09-15 ENCOUNTER — Ambulatory Visit
Admission: RE | Admit: 2022-09-15 | Discharge: 2022-09-15 | Disposition: A | Discharge status: Home / Self Care | Payer: Medicare HMO | Source: Ambulatory Visit | Attending: Family Medicine | Admitting: Family Medicine

## 2022-09-15 DIAGNOSIS — Z1231 Encounter for screening mammogram for malignant neoplasm of breast: Secondary | ICD-10-CM | POA: Insufficient documentation

## 2022-09-20 ENCOUNTER — Other Ambulatory Visit: Payer: Self-pay | Admitting: Family Medicine

## 2022-09-20 DIAGNOSIS — N63 Unspecified lump in unspecified breast: Secondary | ICD-10-CM

## 2022-09-20 DIAGNOSIS — R928 Other abnormal and inconclusive findings on diagnostic imaging of breast: Secondary | ICD-10-CM

## 2022-09-21 ENCOUNTER — Other Ambulatory Visit: Payer: Self-pay | Admitting: Family Medicine

## 2022-09-21 ENCOUNTER — Ambulatory Visit
Admission: RE | Admit: 2022-09-21 | Discharge: 2022-09-21 | Disposition: A | Discharge status: Home / Self Care | Payer: Medicare HMO | Source: Ambulatory Visit | Attending: Family Medicine | Admitting: Family Medicine

## 2022-09-21 DIAGNOSIS — N63 Unspecified lump in unspecified breast: Secondary | ICD-10-CM

## 2022-09-21 DIAGNOSIS — R928 Other abnormal and inconclusive findings on diagnostic imaging of breast: Secondary | ICD-10-CM

## 2022-09-21 DIAGNOSIS — N6320 Unspecified lump in the left breast, unspecified quadrant: Secondary | ICD-10-CM | POA: Insufficient documentation

## 2022-09-26 ENCOUNTER — Other Ambulatory Visit: Payer: Self-pay | Admitting: Family Medicine

## 2022-09-26 DIAGNOSIS — N63 Unspecified lump in unspecified breast: Secondary | ICD-10-CM

## 2022-09-26 DIAGNOSIS — R928 Other abnormal and inconclusive findings on diagnostic imaging of breast: Secondary | ICD-10-CM

## 2022-10-04 ENCOUNTER — Ambulatory Visit
Admission: RE | Admit: 2022-10-04 | Discharge: 2022-10-04 | Disposition: A | Discharge status: Home / Self Care | Payer: Medicare HMO | Source: Ambulatory Visit | Attending: Family Medicine | Admitting: Family Medicine

## 2022-10-04 DIAGNOSIS — R928 Other abnormal and inconclusive findings on diagnostic imaging of breast: Secondary | ICD-10-CM | POA: Diagnosis present

## 2022-10-04 DIAGNOSIS — N63 Unspecified lump in unspecified breast: Secondary | ICD-10-CM | POA: Insufficient documentation

## 2022-10-04 HISTORY — PX: BREAST BIOPSY: SHX20

## 2022-10-04 MED ORDER — LIDOCAINE-EPINEPHRINE 1 %-1:100000 IJ SOLN
8.0000 mL | Freq: Once | INTRAMUSCULAR | Status: AC
  Administered 2022-10-04: 8 mL via INTRADERMAL
  Filled 2022-10-04: qty 8

## 2022-10-04 MED ORDER — LIDOCAINE HCL (PF) 1 % IJ SOLN
5.0000 mL | Freq: Once | INTRAMUSCULAR | Status: AC
  Administered 2022-10-04: 5 mL via INTRADERMAL
  Filled 2022-10-04: qty 5

## 2022-10-05 ENCOUNTER — Encounter: Payer: Self-pay | Admitting: *Deleted

## 2022-10-05 DIAGNOSIS — C50919 Malignant neoplasm of unspecified site of unspecified female breast: Secondary | ICD-10-CM

## 2022-10-05 NOTE — Progress Notes (Signed)
Received referral for newly diagnosed breast cancer from [facility].  Navigation initiated. She would like to see Dr. Grayland Ormond and Dr. Hampton Abbot.   Those appointments have been scheduled.  She will see Dr. Grayland Ormond on Tuesday 11/14  and Dr. Hampton Abbot on Friday 11/17.

## 2022-10-06 LAB — SURGICAL PATHOLOGY

## 2022-10-09 ENCOUNTER — Other Ambulatory Visit: Payer: Medicare HMO

## 2022-10-09 ENCOUNTER — Ambulatory Visit: Payer: Medicare HMO | Admitting: Oncology

## 2022-10-10 ENCOUNTER — Encounter: Payer: Self-pay | Admitting: *Deleted

## 2022-10-10 ENCOUNTER — Encounter: Payer: Self-pay | Admitting: Oncology

## 2022-10-10 ENCOUNTER — Inpatient Hospital Stay: Payer: Medicare HMO

## 2022-10-10 ENCOUNTER — Inpatient Hospital Stay: Payer: Medicare HMO | Attending: Oncology | Admitting: Oncology

## 2022-10-10 DIAGNOSIS — Z881 Allergy status to other antibiotic agents status: Secondary | ICD-10-CM | POA: Insufficient documentation

## 2022-10-10 DIAGNOSIS — C50412 Malignant neoplasm of upper-outer quadrant of left female breast: Secondary | ICD-10-CM | POA: Insufficient documentation

## 2022-10-10 DIAGNOSIS — C50912 Malignant neoplasm of unspecified site of left female breast: Secondary | ICD-10-CM | POA: Insufficient documentation

## 2022-10-10 DIAGNOSIS — Z91041 Radiographic dye allergy status: Secondary | ICD-10-CM | POA: Diagnosis not present

## 2022-10-10 DIAGNOSIS — Z170 Estrogen receptor positive status [ER+]: Secondary | ICD-10-CM | POA: Insufficient documentation

## 2022-10-10 DIAGNOSIS — Z880 Allergy status to penicillin: Secondary | ICD-10-CM | POA: Diagnosis not present

## 2022-10-10 DIAGNOSIS — Z79899 Other long term (current) drug therapy: Secondary | ICD-10-CM | POA: Insufficient documentation

## 2022-10-10 DIAGNOSIS — C50919 Malignant neoplasm of unspecified site of unspecified female breast: Secondary | ICD-10-CM

## 2022-10-10 NOTE — Progress Notes (Signed)
New L breast cancer evaluation.

## 2022-10-10 NOTE — Progress Notes (Signed)
Accompanied patient to initial medical oncology appointment.   Reviewed Breast Cancer treatment handbook.   Care plan summary given to patient.   Reviewed outreach programs and cancer center services.

## 2022-10-10 NOTE — Progress Notes (Signed)
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  INTERVAL HISTORY: Patient is a 56 year old female who was noted to have an abnormal screening mammogram.  Patient states she had not had a mammogram for 2 or 3 years prior to her most recent.  Subsequent ultrasound biopsy revealed the above-stated malignancy.  She currently feels well and is asymptomatic.  She has no neurologic complaints.  She denies any recent fevers or illnesses.  She has a good appetite and denies weight loss.  She has no chest pain, shortness of breath, cough, or hemoptysis.  She denies any nausea, vomiting, constipation, or diarrhea.  She has no urinary complaints.  Patient feels at her baseline and offers no specific complaints today.  REVIEW OF SYSTEMS:   Review of Systems  Constitutional: Negative.  Negative for fever, malaise/fatigue and weight loss.  Respiratory: Negative.  Negative for cough, hemoptysis and shortness of breath.   Cardiovascular: Negative.  Negative for chest pain and leg swelling.  Gastrointestinal: Negative.  Negative for abdominal pain.  Genitourinary: Negative.  Negative for dysuria.  Musculoskeletal: Negative.  Negative for back pain.  Skin: Negative.  Negative for rash.  Neurological: Negative.  Negative for dizziness, focal weakness, weakness and headaches.  Psychiatric/Behavioral: Negative.  The patient is not nervous/anxious.     As per HPI. Otherwise, a complete review of systems is negative.  PAST MEDICAL HISTORY: Past Medical History:  Diagnosis Date   Asthma    Breast cancer ([place])    Cervical cancer ([place])    Degenerative joint disease/osteoarthritis    Hypertension    Lupus ([place])      PAST SURGICAL HISTORY: Past Surgical History:  Procedure Laterality Date   ABDOMINAL HYSTERECTOMY     BREAST BIOPSY Left 10/04/2022   [place] LT BREAST BX W LOC DEV 1ST LESION IMG BX [place] US GUIDE 10/04/2022 [facility]   CHOLECYSTECTOMY N/A    LAPAROSCOPIC GASTRIC BAND REMOVAL WITH LAPAROSCOPIC GASTRIC SLEEVE RESECTION     REPLACEMENT TOTAL KNEE BILATERAL     TUBAL LIGATION      FAMILY HISTORY: Family History  Problem Relation Age of Onset   Breast cancer Neg Hx     ADVANCED DIRECTIVES (Y/N):  N  HEALTH MAINTENANCE: Social History   Tobacco Use   Smoking status: Never   Smokeless tobacco: Never  Substance Use Topics   Alcohol use: No     Colonoscopy:  PAP:  Bone density:  Lipid panel:  Allergies  Allergen Reactions   Etodolac Hives   Latex Hives   Contrast Media [Iodinated Contrast Media]    Keflet [Cephalexin]    Penicillins     Current Outpatient Medications  Medication Sig Dispense Refill   acetaminophen (TYLENOL) 500 MG tablet Take 500 mg by mouth every 6 (six) hours as needed.     ALOE VERA PO Take 1 tablet by mouth daily. Natural aloe     aspirin EC 81 MG tablet Take 81 mg by mouth daily.     cetirizine (ZYRTEC) 10 MG tablet Take 10 mg by mouth daily.     diphenhydrAMINE (BENADRYL) 25 mg capsule Take 25 mg by mouth every 6 (six) hours as needed.     fluticasone (FLONASE) 50 MCG/ACT nasal spray Place 1 spray  into both nostrils daily.     furosemide (LASIX) 40 MG tablet Take 40 mg by mouth daily as needed.     gabapentin (NEURONTIN) 300 MG capsule Take 300 mg by mouth at bedtime.     hydrOXYzine (ATARAX) 10 MG tablet Take 10 mg by mouth 2 (two) times daily.     linaclotide (LINZESS) 290 MCG CAPS capsule Take 290 mcg by mouth daily before breakfast.     losartan (COZAAR) 50 MG tablet Take 50 mg by mouth daily.     metoprolol succinate (TOPROL-XL) 50 MG 24 hr tablet Take 50 mg by mouth daily. Take with or immediately following a meal.     montelukast  (SINGULAIR) 10 MG tablet Take 10 mg by mouth at bedtime.     multivitamin-lutein (OCUVITE-LUTEIN) CAPS capsule Take 1 capsule by mouth daily.     omeprazole (PRILOSEC) 40 MG capsule      tiZANidine (ZANAFLEX) 4 MG tablet Take 4 mg by mouth every 8 (eight) hours as needed for muscle spasms.     traZODone (DESYREL) 100 MG tablet Take 100-300 mg by mouth at bedtime.     ascorbic acid (VITAMIN C) 500 MG tablet Take 500 mg by mouth daily.     B COMPLEX VITAMINS PO Take 1 tablet by mouth daily.     celecoxib (CELEBREX) 200 MG capsule Take 200 mg by mouth daily as needed.     Psyllium (EQ DAILY FIBER PO) Take 1 tablet by mouth daily.     No current facility-administered medications for this visit.    OBJECTIVE: There were no vitals filed for this visit.   There is no height or weight on file to calculate BMI.    ECOG FS:0 - Asymptomatic  General: Well-developed, well-nourished, no acute distress. Eyes: Pink conjunctiva, anicteric sclera. HEENT: Normocephalic, moist mucous membranes. Breast: Exam deferred today. Lungs: No audible wheezing or coughing. Heart: Regular rate and rhythm. Abdomen: Soft, nontender, no obvious distention. Musculoskeletal: No edema, cyanosis, or clubbing. Neuro: Alert, answering all questions appropriately. Cranial nerves grossly intact. Skin: No rashes or petechiae noted. Psych: Normal affect. Lymphatics: No cervical, calvicular, axillary or inguinal LAD.   LAB RESULTS:  Lab Results  Component Value Date   NA 140 02/10/2017   K 3.2 (L) 02/10/2017   CL 103 02/10/2017   CO2 31 02/10/2017   GLUCOSE 129 (H) 02/10/2017   BUN 13 02/10/2017   CREATININE 0.81 02/10/2017   CALCIUM 9.1 02/10/2017   GFRNONAA >60 02/10/2017   GFRAA >60 02/10/2017    Lab Results  Component Value Date   WBC 6.3 02/10/2017   NEUTROABS 2.8 02/10/2017   HGB 13.2 02/10/2017   HCT 38.1 02/10/2017   MCV 86.4 02/10/2017   PLT 256 02/10/2017     STUDIES: [place] LT BREAST BX W LOC DEV  1ST LESION IMG BX [place] US GUIDE  Addendum Date: 10/06/2022   ADDENDUM REPORT: 10/06/2022 14:05 ADDENDUM: PATHOLOGY revealed: A.  BREAST, LEFT 1:00, 14 CM FROM THE NIPPLE; CORE BIOPSIES: - INVASIVE MAMMARY CARCINOMA, NO SPECIAL TYPE. At least 4 mm in this sample. Grade 1. -Ductal carcinoma in situ: Present. -Lymphovascular invasion: Not identified. Pathology results are CONCORDANT with imaging findings, per Dr. Beryle Flock. Pathology results and recommendations were discussed with patient via telephone on 10/05/2022. Patient reported biopsy site doing well with no adverse symptoms, and only slight tenderness at the site. Post biopsy care instructions were reviewed, questions were answered and my direct phone number was provided. Patient was  instructed to call [facility] for any additional questions or concerns related to biopsy site. RECOMMENDATIONS: Surgical and oncological consultation. Request for surgical and oncological consultation relayed to Casper Harrison RN at [facility] by Electa Sniff RN on 10/05/2022. Pathology results reported by Electa Sniff RN on 10/06/2022. Electronically Signed   By: Beryle Flock M.D.   On: 10/06/2022 14:05   Result Date: 10/06/2022 CLINICAL DATA:  56 year old female presenting for ultrasound-guided biopsy of left breast mass EXAM: ULTRASOUND GUIDED LEFT BREAST CORE NEEDLE BIOPSY COMPARISON:  Previous exam(s). PROCEDURE: I met with the patient and we discussed the procedure of ultrasound-guided biopsy, including benefits and alternatives. We discussed the high likelihood of a successful procedure. We discussed the risks of the procedure, including infection, bleeding, tissue injury, clip migration, and inadequate sampling. Informed written consent was given. The usual time-out protocol was performed immediately prior to the procedure. Lesion quadrant: Upper outer quadrant Using sterile technique and 1% Lidocaine as local anesthetic, under direct  ultrasound visualization, a 14 gauge spring-loaded device was used to perform biopsy of a left breast mass in the 1 o'clock position using a lateral approach. At the conclusion of the procedure heart shaped tissue marker clip was deployed into the biopsy cavity. Follow up 2 view mammogram was performed and dictated separately. IMPRESSION: Ultrasound guided biopsy of a left breast mass in the 1 o'clock position. No apparent complications. Electronically Signed: By: Beryle Flock M.D. On: 10/04/2022 09:43   MM CLIP PLACEMENT LEFT  Result Date: 10/04/2022 CLINICAL DATA:  Postprocedure mammogram EXAM: 3D DIAGNOSTIC LEFT MAMMOGRAM POST ULTRASOUND BIOPSY COMPARISON:  Previous exam(s). FINDINGS: 3D Mammographic images were obtained following ultrasound guided biopsy of a mass in the left breast 1 o'clock position. The biopsy marking clip is in expected position at the site of biopsy. IMPRESSION: Appropriate positioning of the heart shaped biopsy marking clip at the site of biopsy in the left breast 1 o'clock position. Final Assessment: Post Procedure Mammograms for Marker Placement Electronically Signed   By: Beryle Flock M.D.   On: 10/04/2022 09:45  MM DIAG BREAST TOMO UNI LEFT  Result Date: 09/21/2022 CLINICAL DATA:  Patient returns today to evaluate a possible LEFT breast mass questioned on recent screening mammogram. EXAM: DIGITAL DIAGNOSTIC UNILATERAL LEFT MAMMOGRAM WITH TOMOSYNTHESIS; ULTRASOUND LEFT BREAST LIMITED TECHNIQUE: Left digital diagnostic mammography and breast tomosynthesis was performed.; Targeted ultrasound examination of the left breast was performed. COMPARISON:  Previous exams including recent screening mammogram dated 09/15/2022. ACR Breast Density Category a: The breast tissue is almost entirely fatty. FINDINGS: On today's additional diagnostic views, including spot compression with 3D tomosynthesis, partially obscured mass with associated architectural distortion is confirmed  within the upper-outer quadrant of the LEFT breast Targeted ultrasound is performed, showing hypoechoic mass/area within the LEFT breast at the 1 o'clock axis, 14 cm from the nipple, measuring 1.4 cm greatest extent, most superficial portion demonstrating vascularity, corresponding to the mammographic finding. LEFT axilla was evaluated with ultrasound showing no enlarged or morphologically abnormal lymph nodes. IMPRESSION: Suspicious hypoechoic mass/area within the LEFT breast at the 1 o'clock axis, 14 cm from the nipple, measuring 1.4 cm greatest dimension, most superficial portion demonstrating internal vascularity, corresponding to the mammographic finding. Recommend ultrasound-guided biopsy. RECOMMENDATION: Ultrasound-guided biopsy for the hypoechoic mass/area within the LEFT breast at the 1 o'clock axis, 14 cm from the nipple, measuring 1.4 cm, with preferential targeting of the more superficial portion that demonstrates internal vascularity. Ultrasound-guided biopsy will be scheduled at patient's convenience.  I have discussed the findings and recommendations with the patient. If applicable, a reminder letter will be sent to the patient regarding the next appointment. BI-RADS CATEGORY  4: Suspicious. Electronically Signed   By: Franki Cabot M.D.   On: 09/21/2022 11:23  US BREAST LTD UNI LEFT INC AXILLA  Result Date: 09/21/2022 CLINICAL DATA:  Patient returns today to evaluate a possible LEFT breast mass questioned on recent screening mammogram. EXAM: DIGITAL DIAGNOSTIC UNILATERAL LEFT MAMMOGRAM WITH TOMOSYNTHESIS; ULTRASOUND LEFT BREAST LIMITED TECHNIQUE: Left digital diagnostic mammography and breast tomosynthesis was performed.; Targeted ultrasound examination of the left breast was performed. COMPARISON:  Previous exams including recent screening mammogram dated 09/15/2022. ACR Breast Density Category a: The breast tissue is almost entirely fatty. FINDINGS: On today's additional diagnostic views,  including spot compression with 3D tomosynthesis, partially obscured mass with associated architectural distortion is confirmed within the upper-outer quadrant of the LEFT breast Targeted ultrasound is performed, showing hypoechoic mass/area within the LEFT breast at the 1 o'clock axis, 14 cm from the nipple, measuring 1.4 cm greatest extent, most superficial portion demonstrating vascularity, corresponding to the mammographic finding. LEFT axilla was evaluated with ultrasound showing no enlarged or morphologically abnormal lymph nodes. IMPRESSION: Suspicious hypoechoic mass/area within the LEFT breast at the 1 o'clock axis, 14 cm from the nipple, measuring 1.4 cm greatest dimension, most superficial portion demonstrating internal vascularity, corresponding to the mammographic finding. Recommend ultrasound-guided biopsy. RECOMMENDATION: Ultrasound-guided biopsy for the hypoechoic mass/area within the LEFT breast at the 1 o'clock axis, 14 cm from the nipple, measuring 1.4 cm, with preferential targeting of the more superficial portion that demonstrates internal vascularity. Ultrasound-guided biopsy will be scheduled at patient's convenience. I have discussed the findings and recommendations with the patient. If applicable, a reminder letter will be sent to the patient regarding the next appointment. BI-RADS CATEGORY  4: Suspicious. Electronically Signed   By: Franki Cabot M.D.   On: 09/21/2022 11:23  MM 3D SCREEN BREAST BILATERAL  Result Date: 09/19/2022 CLINICAL DATA:  Screening. EXAM: DIGITAL SCREENING BILATERAL MAMMOGRAM WITH TOMOSYNTHESIS AND CAD TECHNIQUE: Bilateral screening digital craniocaudal and mediolateral oblique mammograms were obtained. Bilateral screening digital breast tomosynthesis was performed. The images were evaluated with computer-aided detection. COMPARISON:  Previous exam(s). ACR Breast Density Category a: The breast tissue is almost entirely fatty. FINDINGS: In the left breast, a  possible mass warrants further evaluation. In the right breast, no findings suspicious for malignancy. IMPRESSION: Further evaluation is suggested for a possible mass in the left breast. RECOMMENDATION: Diagnostic mammogram and possibly ultrasound of the left breast. (Code:[id number]) The patient will be contacted regarding the findings, and additional imaging will be scheduled. BI-RADS CATEGORY  0: Incomplete. Need additional imaging evaluation and/or prior mammograms for comparison. Electronically Signed   By: Marin Olp M.D.   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  Patient has an appointment with surgery later this week to discuss lumpectomy and/or mastectomy.  Patient has indicated that she may want to pursue mastectomy.  Given the stage of disease, will send Oncotype testing on her surgical specimen to determine whether or not chemotherapy is necessary.  If patient undergoes mastectomy, she will not require adjuvant XRT.  Given the ER/PR status of her tumor, she will benefit from letrozole for total 5 years after  all of her treatments are complete.  No intervention is needed at this time.  Return to clinic approximately 2 weeks after her surgery to discuss her final pathology results and additional treatment planning.  I spent a total of 60 minutes reviewing chart data, face-to-face evaluation with the patient, counseling and coordination of care as detailed above.   Patient expressed understanding and was in agreement with this plan. She also understands that She can call clinic at any time with any questions, concerns, or complaints.    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

## 2022-10-10 NOTE — Research (Signed)
Trial:  [employer] 2021-05 - Specimen Collection Study to Evaluate Biomarkers in Subjects with Cancer   Patient Heather Mcintyre was identified by this nurse as a potential candidate for the above listed study.  This Clinical Research Nurse met with SRINIDHI LANDERS, [id number], on 10/10/22 in a manner and location that ensures patient privacy to discuss participation in the above listed research study.  Patient is Unaccompanied.  A copy of the informed consent document with embedded HIPAA language was provided to the patient.  Patient reads, speaks, and understands [place].   Patient was provided with the business card of this Nurse and encouraged to contact the research team with any questions.  Approximately 20 minutes were spent with the patient reviewing the informed consent documents.  Patient was provided the option of taking informed consent documents home to review and was encouraged to review at their convenience with their support network, including other care providers. Patient took the consent documents home to review. The patient states she wants to participate and will return for her consent and lab visit on this Friday at 1030 am prior to her appointment with Dr. Koleen Nimrod at 90 am. Research brochure and research nurse business card with phone number provided to patient in case she may have questions prior to her visit on Friday.  Jeral Fruit, RN 10/10/22 12:25 PM

## 2022-10-11 ENCOUNTER — Telehealth: Payer: Self-pay

## 2022-10-11 DIAGNOSIS — C50912 Malignant neoplasm of unspecified site of left female breast: Secondary | ICD-10-CM

## 2022-10-11 NOTE — Telephone Encounter (Signed)
Patient returned call to this nurse. Patient was asked about her history of cervical cancer and when she was diagnosed and treated. She states that was in [place] in 2002 / 2003, and she had a hysterectomy. Patient was informed that the eligibility for the [employer] protocol required any previous cancer be greater than 5 years ago, so she is eligible for the study.  Appointment for Friday 10/13/2022 at 1030 am confirmed with the patient.  Jeral Fruit, RN 10/11/22 2:13 PM

## 2022-10-11 NOTE — Telephone Encounter (Signed)
Call placed to patient to review eligibility questions for the [employer] blood protocol. Message left on identified voice mail for patient to return call at her earliest convenience.  Jeral Fruit, RN 10/11/22 1:50 PM

## 2022-10-13 ENCOUNTER — Other Ambulatory Visit: Payer: Self-pay

## 2022-10-13 ENCOUNTER — Ambulatory Visit (INDEPENDENT_AMBULATORY_CARE_PROVIDER_SITE_OTHER): Payer: Medicare HMO | Admitting: Surgery

## 2022-10-13 ENCOUNTER — Encounter: Payer: Self-pay | Admitting: Surgery

## 2022-10-13 ENCOUNTER — Telehealth: Payer: Self-pay

## 2022-10-13 ENCOUNTER — Inpatient Hospital Stay: Payer: Medicare HMO

## 2022-10-13 VITALS — BP 138/82 | HR 65 | Temp 98.7°F | Ht 68.0 in | Wt 278.2 lb

## 2022-10-13 DIAGNOSIS — Z170 Estrogen receptor positive status [ER+]: Secondary | ICD-10-CM | POA: Diagnosis not present

## 2022-10-13 DIAGNOSIS — C50912 Malignant neoplasm of unspecified site of left female breast: Secondary | ICD-10-CM

## 2022-10-13 DIAGNOSIS — C50919 Malignant neoplasm of unspecified site of unspecified female breast: Secondary | ICD-10-CM

## 2022-10-13 MED ORDER — LIDOCAINE-PRILOCAINE 2.5-2.5 % EX CREA: TOPICAL_CREAM | CUTANEOUS | 0 refills | Status: DC

## 2022-10-13 NOTE — Patient Instructions (Signed)
Our surgery scheduler Barbara will call you within 24-48 hours to get you scheduled. If you have not heard from her after 48 hours, please call our office. Have the blue sheet available when she calls to write down important information.   If you have any concerns or questions, please feel free to call our office.   Total or Modified Radical Mastectomy A total mastectomy and a modified radical mastectomy are surgeries that are done as part of treatment for breast cancer. Both types involve removing a breast. In a total mastectomy (simple mastectomy), all breast tissue including the nipple will be removed. In a modified radical mastectomy, lymph nodes under the arm will be removed along with the breast and nipple. Some of the lining over the muscle tissues under the breast may also be removed. These procedures may also be used to help prevent breast cancer. A preventive (prophylactic) mastectomy may be done if you are at an increased risk of breast cancer due to harmful changes (mutations) in certain genes, such as the BRCA genes. In that case, the procedure involves removing both of your breasts. This can reduce your risk of developing breast cancer in the future. For a transgender person, a total mastectomy may be done as part of a surgical transition from female to female. Let your health care provider know about: Any allergies you have. All medicines you are taking, including vitamins, herbs, eye drops, creams, and over-the-counter medicines. Any problems you or family members have had with anesthetic medicines. Any bleeding problems you have. Any surgeries you have had. Any medical conditions you have. Whether you are pregnant or may be pregnant. What are the risks? Generally, this is a safe procedure. However, problems may occur, including: Infection. Bleeding. Allergic reactions to medicines. Scar tissue. Chest numbness, sensation of throbbing, or tingling on the side of the  surgery. Fluid buildup under the skin flaps where your breast was removed (seroma). Stress or sadness from losing your breast. If you have the lymph nodes under your arm removed, you may have arm swelling, weakness, or numbness on the same side of your body as your surgery. What happens before the procedure? When to stop eating and drinking Follow instructions from your health care provider about what you may eat and drink before your procedure. These may include: 8 hours before your procedure Stop eating most foods. Do not eat meat, fried foods, or fatty foods. Eat only light foods, such as toast or crackers. All liquids are okay except energy drinks and alcohol. 6 hours before your procedure Stop eating. Drink only clear liquids, such as water, clear fruit juice, black coffee, plain tea, and sports drinks. Do not drink energy drinks or alcohol. 2 hours before your procedure Stop drinking all liquids. You may be allowed to take medicines with small sips of water. If you do not follow your health care provider's instructions, your procedure may be delayed or canceled. Medicines Ask your health care provider about: Changing or stopping your regular medicines. This is especially important if you are taking diabetes medicines or blood thinners. Taking medicines such as aspirin and ibuprofen. These medicines can thin your blood. Do not take these medicines unless your health care provider tells you to take them. Taking over-the-counter medicines, vitamins, herbs, and supplements. General instructions You may be checked for extra fluid around your lymph nodes (lymphedema). Do not use any products that contain nicotine or tobacco before the procedure. These products include cigarettes, chewing tobacco, and vaping devices, such   as e-cigarettes. If you need help quitting, ask your health care provider. Ask your health care provider about: How your surgery site will be marked. What steps will be  taken to help prevent infection. These steps may include: Removing hair at the surgery site. Washing skin with a germ-killing soap. Taking antibiotic medicine. What happens during the procedure? An IV will be inserted into one of your veins. You will be given: A medicine to help you relax (sedative). A medicine to make you fall asleep (general anesthetic). A wide incision will be made around your nipple. The skin of the breast and the nipple inside the incision will be removed along with all breast tissue. Lymph nodes under the arm on the side of the tumor will be checked to see if the cancer has spread. If you are having a modified radical mastectomy: The lining over your chest muscles will be removed. The incision may be extended to reach the lymph nodes under your arm, or a second incision may be made. Lymph nodes will be removed. Breast tissue and lymph nodes that are removed will be sent to the lab for testing. You may have a drainage tube inserted into your incision to collect fluid that builds up after surgery. This tube will be connected to a suction bulb on the outside of your body to remove the fluid. Your incision or incisions will be closed with stitches (sutures), skin glue, or adhesive strips. A bandage (dressing) will be placed over your breast area. If lymph nodes were removed, a dressing will also be placed under your arm. The procedure may vary among health care providers and hospitals. What happens after the procedure? Your blood pressure, heart rate, breathing rate, and blood oxygen level will be monitored until you leave the hospital or clinic. You will be given pain medicine as needed. Your IV can be removed when you are able to eat and drink. You may have a drainage tube in place for 2-3 days to prevent a collection of blood (hematoma) from developing in the breast area. You will be given instructions about caring for the drain before you go home. A pressure bandage may  be applied for 1-2 days to prevent bleeding or swelling. Ask your health care provider how to care for your pressure bandage at home. Summary In a total mastectomy (simple mastectomy), all breast tissue including the nipple will be removed. In a modified radical mastectomy, lymph nodes under the arm will be removed along with the breast and nipple, and the chest wall lining. Before the procedure, follow instructions from your health care provider about eating and drinking, and ask about changing or stopping your regular medicines. You may have a drainage tube inserted into your incision to collect fluid that builds up after surgery. This tube will be connected to a suction bulb on the outside of your body to remove the fluid. This information is not intended to replace advice given to you by your health care provider. Make sure you discuss any questions you have with your health care provider. Document Revised: 08/14/2021 Document Reviewed: 08/14/2021 Elsevier Patient Education  2023 [employer]  

## 2022-10-13 NOTE — Progress Notes (Unsigned)
10/13/2022  Reason for Visit:  Left breast cancer  Requesting Provider:  Casper Harrison, RN  History of Present Illness: Heather Mcintyre is a 56 y.o. female presenting for evaluation of newly diagnosed left breast cancer.  The patient had a screening mammogram on 09/15/22 which showed a mass in the left breast.  Subsequent diagnostic mammogram and ultrasound on 09/21/22 showed a 1.4 cm mass at 1 o'clock position, 14 cm from nipple.  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  She saw Dr. Grayland Ormond on 10/10/22 and had talked with him about being interested in a mastectomy.  She denies any breast pain, palpable masses, nipple changes.  No family history of breast cancer.  Past Medical History: Past Medical History:  Diagnosis Date   Asthma    Breast cancer ([place])    Cervical cancer ([place])    Degenerative joint disease/osteoarthritis    Hypertension    Lupus ([place])      Past Surgical History: Past Surgical History:  Procedure Laterality Date   ABDOMINAL HYSTERECTOMY     BREAST BIOPSY Left 10/04/2022   [place] LT BREAST BX W LOC DEV 1ST LESION IMG BX SPEC US GUIDE 10/04/2022 [facility]   CHOLECYSTECTOMY N/A    LAPAROSCOPIC GASTRIC BAND REMOVAL WITH LAPAROSCOPIC GASTRIC SLEEVE RESECTION     REPLACEMENT TOTAL KNEE BILATERAL     TUBAL LIGATION      Home Medications: Prior to Admission medications   Medication Sig Start Date End Date Taking? Authorizing Provider  acetaminophen (TYLENOL) 500 MG tablet Take 500 mg by mouth every 6 (six) hours as needed.   Yes [provider]  ALOE VERA PO Take 1 tablet by mouth daily. Natural aloe   Yes [provider]  ascorbic acid (VITAMIN C) 500 MG tablet Take 500 mg by mouth daily.   Yes [provider]  aspirin EC 81 MG tablet Take 81 mg by mouth daily.   Yes [provider]  B COMPLEX VITAMINS PO Take 1 tablet by mouth daily.   Yes [provider]  celecoxib (CELEBREX) 200 MG capsule Take 200 mg by mouth daily as needed.   Yes [provider]  cetirizine (ZYRTEC) 10 MG tablet Take 10 mg by mouth daily.   Yes [provider]  diphenhydrAMINE (BENADRYL) 25 mg capsule Take 25 mg by mouth every 6 (six) hours as needed.   Yes [provider]  fluticasone (FLONASE) 50 MCG/ACT nasal spray Place 1 spray into both nostrils daily.   Yes [provider]  furosemide (LASIX) 40 MG tablet Take 40 mg by mouth daily as needed.   Yes [provider]  gabapentin (NEURONTIN) 300 MG capsule Take 300 mg by mouth at bedtime. 06/28/16  Yes [provider]  hydrOXYzine (ATARAX) 10 MG tablet Take 10 mg by mouth 2 (two) times daily. 09/26/22  Yes [provider]  lidocaine-prilocaine (EMLA) cream Apply to the areola of the left breast and cover with plastic wrap one hour prior to leaving for surgery. 10/13/22  Yes Henry Utsey, Jacqulyn Bath, MD  linaclotide (LINZESS) 290 MCG CAPS capsule Take 290 mcg by mouth daily before breakfast.   Yes [provider]  metoprolol succinate (TOPROL-XL) 50 MG 24 hr tablet Take 50 mg by mouth daily. Take with or immediately following a meal.   Yes [provider]  montelukast (SINGULAIR) 10 MG tablet Take 10 mg by mouth at bedtime.  Yes [provider]  multivitamin-lutein (OCUVITE-LUTEIN) CAPS capsule Take 1 capsule by mouth daily.   Yes [provider]  omeprazole (PRILOSEC) 40 MG capsule  09/13/17  Yes [provider]  tiZANidine (ZANAFLEX) 4 MG tablet Take 4 mg by mouth every 8 (eight) hours as needed for muscle spasms.   Yes [provider]  traZODone (DESYREL) 100 MG tablet Take 100-300 mg by mouth at bedtime.   Yes [provider]    Allergies: Allergies  Allergen Reactions   Etodolac Hives   Latex Hives   Contrast Media [Iodinated Contrast Media]    Keflet [Cephalexin]    Penicillins      Social History:  reports that she has never smoked. She has never used smokeless tobacco. She reports that she does not drink alcohol. No history on file for drug use.   Family History: Family History  Problem Relation Age of Onset   Breast cancer Neg Hx     Review of Systems: Review of Systems  Constitutional:  Negative for chills and fever.  HENT:  Negative for hearing loss.   Respiratory:  Negative for shortness of breath.   Cardiovascular:  Negative for chest pain.  Gastrointestinal:  Negative for nausea and vomiting.  Genitourinary:  Negative for dysuria.  Musculoskeletal:  Negative for myalgias.  Skin:  Negative for rash.  Neurological:  Negative for dizziness.  Psychiatric/Behavioral:  Negative for depression.     Physical Exam BP 138/82   Pulse 65   Temp 98.7 F (37.1 C) (Oral)   Ht _0  (1.727 m)   Wt 278 lb 3.2 oz (126.2 kg)   SpO2 97%   BMI 42.30 kg/m  CONSTITUTIONAL: No acute distress, well nourished. HEENT:  Normocephalic, atraumatic, extraocular motion intact. NECK: Trachea is midline, and there is no jugular venous distension.  RESPIRATORY:  Lungs are clear, and breath sounds are equal bilaterally. Normal respiratory effort without pathologic use of accessory muscles. CARDIOVASCULAR: Heart is regular without murmurs, gallops, or rubs. BREAST:  Left breast with biopsy in upper outer quadrant, and only palpable changes from the biopsy.  No other palpable areas, no skin changes or nipple changes or drainage.  Left axilla without any palpable lymphadenopathy.  Right breast without any skin changes, palpable masses, or nipple changes/drainage.  No right axillary lymphadenopathy. MUSCULOSKELETAL:  Normal muscle strength and tone in all four extremities.  No peripheral edema or cyanosis. SKIN: Skin turgor is normal. There are no pathologic skin lesions.  NEUROLOGIC:  Motor and sensation is grossly normal.  Cranial nerves are grossly intact. PSYCH:  Alert and  oriented to person, place and time. Affect is normal.  Laboratory Analysis: Labs from 03/22/21: Na 140, K 3.8, Cl 103, CO2 32, BUN 15, Cr 0.9.  Total bili 0.6, AST 22, ALT 19, Alk Phos 67, albumin 3.8.  WBC 6.5, Hgb 12.9, Hct 40, Plt 311  Left breast biopsy 10/04/22: DIAGNOSIS:  A. BREAST, LEFT 1:00, 14 CM FROM THE NIPPLE; CORE BIOPSIES:  - INVASIVE MAMMARY CARCINOMA, NO SPECIAL TYPE.  Size of invasive carcinoma: At least 4 mm in this sample  Histologic grade of invasive carcinoma: Grade 1       Glandular/tubular differentiation score: 2       Nuclear pleomorphism score: 1       Mitotic rate score: 1              Total score: 4  Ductal carcinoma in situ: Present  Lymphovascular invasion: Not identified  Estrogen Receptor (ER) Status: POSITIVE          Percentage of cells with nuclear positivity: Greater than 90%          Average intensity of staining: Strong   Progesterone Receptor (PgR) Status: POSITIVE           Percentage of cells with nuclear positivity: Greater than 90%           Average intensity of staining: Strong   HER2 (by immunohistochemistry): NEGATIVE (Score 0)    Imaging: Left Mammogram + U/S 09/21/22: FINDINGS: On today's additional diagnostic views, including spot compression with 3D tomosynthesis, partially obscured mass with associated architectural distortion is confirmed within the upper-outer quadrant of the LEFT breast   Targeted ultrasound is performed, showing hypoechoic mass/area within the LEFT breast at the 1 o'clock axis, 14 cm from the nipple, measuring 1.4 cm greatest extent, most superficial portion demonstrating vascularity, corresponding to the mammographic finding.   LEFT axilla was evaluated with ultrasound showing no enlarged or morphologically abnormal lymph nodes.   IMPRESSION: Suspicious hypoechoic mass/area within the LEFT breast at the 1 o'clock axis, 14 cm from the nipple, measuring 1.4 cm greatest dimension, most superficial portion  demonstrating internal vascularity, corresponding to the mammographic finding. Recommend ultrasound-guided biopsy.   RECOMMENDATION: Ultrasound-guided biopsy for the hypoechoic mass/area within the LEFT breast at the 1 o'clock axis, 14 cm from the nipple, measuring 1.4 cm, with preferential targeting of the more superficial portion that demonstrates internal vascularity.   Ultrasound-guided biopsy will be scheduled at patient's convenience.   I have discussed the findings and recommendations with the patient. If applicable, a reminder letter will be sent to the patient regarding the next appointment.   BI-RADS CATEGORY  4: Suspicious.    Assessment and Plan: This is a 56 y.o. female with newly diagnosed left breast cancer.  --Discussed with the patient the findings on her imaging and biopsy results.  Discussed with her the possible treatment options for lumpectomy vs mastectomy.  With lumpectomy, the recommendation would be to do adjuvant radiation therapy as well.  Radiation would involve daily treatments for likely 6 weeks.  She is not interested in radiation and would rather have a mastectomy.  Discussed with her that in addition to the breast component, we also need to sample lymph nodes in the left axilla, a sentinel lymph node biopsy.  Discussed with her the steps to prepare for this on the day of surgery which includes going to nuclear medicine for injection of radioactive tracer, then injection of methylene blue intra-op. Reviewed with her the surgery at length including the incisions, the risk of bleeding, infection, injury to surrounding structures, lymphedema, hospital stay overnight, drains, post-op pain control, activity restriction, and she's willing to proceed. --Will schedule the patient for 11/07/22.  All of her questions have been answered.  Will send for medical clearance.  Last dose of aspirin would be on 11/01/22, though patient reports that she has not taken aspirin for a while  already.  I spent 55 minutes dedicated to the care of this patient on the date of this encounter to include pre-visit review of records, face-to-face time with the patient discussing diagnosis and management, and any post-visit coordination of care.   Melvyn Neth, [facility]

## 2022-10-13 NOTE — Research (Signed)
[employer] 2021-05 - Specimen Collection Study to Evaluate Biomarkers in Subjects with Cancer    This Nurse has reviewed this patient's inclusion and exclusion criteria as a second review and confirms Heather Mcintyre is eligible for study participation.  Patient may continue with enrollment.  Marjie Skiff Collin Rengel, RN, BSN, [facility]  [facility] Clinical Research Nurse [facility] Direct Dial [phone]  Pager [phone] 10/13/2022 10:32 AM

## 2022-10-13 NOTE — Telephone Encounter (Signed)
Faxed medical clearance to [facility] at [phone].

## 2022-10-13 NOTE — H&P (View-Only) (Signed)
10/13/2022  Reason for Visit:  Left breast cancer  Requesting Provider:  Casper Harrison, RN  History of Present Illness: Heather Mcintyre is a 56 y.o. female presenting for evaluation of newly diagnosed left breast cancer.  The patient had a screening mammogram on 09/15/22 which showed a mass in the left breast.  Subsequent diagnostic mammogram and ultrasound on 09/21/22 showed a 1.4 cm mass at 1 o'clock position, 14 cm from nipple.  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  She saw Dr. Grayland Ormond on 10/10/22 and had talked with him about being interested in a mastectomy.  She denies any breast pain, palpable masses, nipple changes.  No family history of breast cancer.  Past Medical History: Past Medical History:  Diagnosis Date   Asthma    Breast cancer ([place])    Cervical cancer ([place])    Degenerative joint disease/osteoarthritis    Hypertension    Lupus ([place])      Past Surgical History: Past Surgical History:  Procedure Laterality Date   ABDOMINAL HYSTERECTOMY     BREAST BIOPSY Left 10/04/2022   [place] LT BREAST BX W LOC DEV 1ST LESION IMG BX SPEC US GUIDE 10/04/2022 [facility]   CHOLECYSTECTOMY N/A    LAPAROSCOPIC GASTRIC BAND REMOVAL WITH LAPAROSCOPIC GASTRIC SLEEVE RESECTION     REPLACEMENT TOTAL KNEE BILATERAL     TUBAL LIGATION      Home Medications: Prior to Admission medications   Medication Sig Start Date End Date Taking? Authorizing Provider  acetaminophen (TYLENOL) 500 MG tablet Take 500 mg by mouth every 6 (six) hours as needed.   Yes [provider]  ALOE VERA PO Take 1 tablet by mouth daily. Natural aloe   Yes [provider]  ascorbic acid (VITAMIN C) 500 MG tablet Take 500 mg by mouth daily.   Yes [provider]  aspirin EC 81 MG tablet Take 81 mg by mouth daily.   Yes [provider]  B COMPLEX VITAMINS PO Take 1 tablet by mouth daily.   Yes [provider]  celecoxib (CELEBREX) 200 MG capsule Take 200 mg by mouth daily as needed.   Yes [provider]  cetirizine (ZYRTEC) 10 MG tablet Take 10 mg by mouth daily.   Yes [provider]  diphenhydrAMINE (BENADRYL) 25 mg capsule Take 25 mg by mouth every 6 (six) hours as needed.   Yes [provider]  fluticasone (FLONASE) 50 MCG/ACT nasal spray Place 1 spray into both nostrils daily.   Yes [provider]  furosemide (LASIX) 40 MG tablet Take 40 mg by mouth daily as needed.   Yes [provider]  gabapentin (NEURONTIN) 300 MG capsule Take 300 mg by mouth at bedtime. 06/28/16  Yes [provider]  hydrOXYzine (ATARAX) 10 MG tablet Take 10 mg by mouth 2 (two) times daily. 09/26/22  Yes [provider]  lidocaine-prilocaine (EMLA) cream Apply to the areola of the left breast and cover with plastic wrap one hour prior to leaving for surgery. 10/13/22  Yes Austen Wygant, Jacqulyn Bath, MD  linaclotide (LINZESS) 290 MCG CAPS capsule Take 290 mcg by mouth daily before breakfast.   Yes [provider]  metoprolol succinate (TOPROL-XL) 50 MG 24 hr tablet Take 50 mg by mouth daily. Take with or immediately following a meal.   Yes [provider]  montelukast (SINGULAIR) 10 MG tablet Take 10 mg by mouth at bedtime.  Yes [provider]  multivitamin-lutein (OCUVITE-LUTEIN) CAPS capsule Take 1 capsule by mouth daily.   Yes [provider]  omeprazole (PRILOSEC) 40 MG capsule  09/13/17  Yes [provider]  tiZANidine (ZANAFLEX) 4 MG tablet Take 4 mg by mouth every 8 (eight) hours as needed for muscle spasms.   Yes [provider]  traZODone (DESYREL) 100 MG tablet Take 100-300 mg by mouth at bedtime.   Yes [provider]    Allergies: Allergies  Allergen Reactions   Etodolac Hives   Latex Hives   Contrast Media [Iodinated Contrast Media]    Keflet [Cephalexin]    Penicillins      Social History:  reports that she has never smoked. She has never used smokeless tobacco. She reports that she does not drink alcohol. No history on file for drug use.   Family History: Family History  Problem Relation Age of Onset   Breast cancer Neg Hx     Review of Systems: Review of Systems  Constitutional:  Negative for chills and fever.  HENT:  Negative for hearing loss.   Respiratory:  Negative for shortness of breath.   Cardiovascular:  Negative for chest pain.  Gastrointestinal:  Negative for nausea and vomiting.  Genitourinary:  Negative for dysuria.  Musculoskeletal:  Negative for myalgias.  Skin:  Negative for rash.  Neurological:  Negative for dizziness.  Psychiatric/Behavioral:  Negative for depression.     Physical Exam BP 138/82   Pulse 65   Temp 98.7 F (37.1 C) (Oral)   Ht _0  (1.727 m)   Wt 278 lb 3.2 oz (126.2 kg)   SpO2 97%   BMI 42.30 kg/m  CONSTITUTIONAL: No acute distress, well nourished. HEENT:  Normocephalic, atraumatic, extraocular motion intact. NECK: Trachea is midline, and there is no jugular venous distension.  RESPIRATORY:  Lungs are clear, and breath sounds are equal bilaterally. Normal respiratory effort without pathologic use of accessory muscles. CARDIOVASCULAR: Heart is regular without murmurs, gallops, or rubs. BREAST:  Left breast with biopsy in upper outer quadrant, and only palpable changes from the biopsy.  No other palpable areas, no skin changes or nipple changes or drainage.  Left axilla without any palpable lymphadenopathy.  Right breast without any skin changes, palpable masses, or nipple changes/drainage.  No right axillary lymphadenopathy. MUSCULOSKELETAL:  Normal muscle strength and tone in all four extremities.  No peripheral edema or cyanosis. SKIN: Skin turgor is normal. There are no pathologic skin lesions.  NEUROLOGIC:  Motor and sensation is grossly normal.  Cranial nerves are grossly intact. PSYCH:  Alert and  oriented to person, place and time. Affect is normal.  Laboratory Analysis: Labs from 03/22/21: Na 140, K 3.8, Cl 103, CO2 32, BUN 15, Cr 0.9.  Total bili 0.6, AST 22, ALT 19, Alk Phos 67, albumin 3.8.  WBC 6.5, Hgb 12.9, Hct 40, Plt 311  Left breast biopsy 10/04/22: DIAGNOSIS:  A. BREAST, LEFT 1:00, 14 CM FROM THE NIPPLE; CORE BIOPSIES:  - INVASIVE MAMMARY CARCINOMA, NO SPECIAL TYPE.  Size of invasive carcinoma: At least 4 mm in this sample  Histologic grade of invasive carcinoma: Grade 1       Glandular/tubular differentiation score: 2       Nuclear pleomorphism score: 1       Mitotic rate score: 1              Total score: 4  Ductal carcinoma in situ: Present  Lymphovascular invasion: Not identified  Estrogen Receptor (ER) Status: POSITIVE          Percentage of cells with nuclear positivity: Greater than 90%          Average intensity of staining: Strong   Progesterone Receptor (PgR) Status: POSITIVE           Percentage of cells with nuclear positivity: Greater than 90%           Average intensity of staining: Strong   HER2 (by immunohistochemistry): NEGATIVE (Score 0)    Imaging: Left Mammogram + U/S 09/21/22: FINDINGS: On today's additional diagnostic views, including spot compression with 3D tomosynthesis, partially obscured mass with associated architectural distortion is confirmed within the upper-outer quadrant of the LEFT breast   Targeted ultrasound is performed, showing hypoechoic mass/area within the LEFT breast at the 1 o'clock axis, 14 cm from the nipple, measuring 1.4 cm greatest extent, most superficial portion demonstrating vascularity, corresponding to the mammographic finding.   LEFT axilla was evaluated with ultrasound showing no enlarged or morphologically abnormal lymph nodes.   IMPRESSION: Suspicious hypoechoic mass/area within the LEFT breast at the 1 o'clock axis, 14 cm from the nipple, measuring 1.4 cm greatest dimension, most superficial portion  demonstrating internal vascularity, corresponding to the mammographic finding. Recommend ultrasound-guided biopsy.   RECOMMENDATION: Ultrasound-guided biopsy for the hypoechoic mass/area within the LEFT breast at the 1 o'clock axis, 14 cm from the nipple, measuring 1.4 cm, with preferential targeting of the more superficial portion that demonstrates internal vascularity.   Ultrasound-guided biopsy will be scheduled at patient's convenience.   I have discussed the findings and recommendations with the patient. If applicable, a reminder letter will be sent to the patient regarding the next appointment.   BI-RADS CATEGORY  4: Suspicious.    Assessment and Plan: This is a 56 y.o. female with newly diagnosed left breast cancer.  --Discussed with the patient the findings on her imaging and biopsy results.  Discussed with her the possible treatment options for lumpectomy vs mastectomy.  With lumpectomy, the recommendation would be to do adjuvant radiation therapy as well.  Radiation would involve daily treatments for likely 6 weeks.  She is not interested in radiation and would rather have a mastectomy.  Discussed with her that in addition to the breast component, we also need to sample lymph nodes in the left axilla, a sentinel lymph node biopsy.  Discussed with her the steps to prepare for this on the day of surgery which includes going to nuclear medicine for injection of radioactive tracer, then injection of methylene blue intra-op. Reviewed with her the surgery at length including the incisions, the risk of bleeding, infection, injury to surrounding structures, lymphedema, hospital stay overnight, drains, post-op pain control, activity restriction, and she's willing to proceed. --Will schedule the patient for 11/07/22.  All of her questions have been answered.  Will send for medical clearance.  Last dose of aspirin would be on 11/01/22, though patient reports that she has not taken aspirin for a while  already.  I spent 55 minutes dedicated to the care of this patient on the date of this encounter to include pre-visit review of records, face-to-face time with the patient discussing diagnosis and management, and any post-visit coordination of care.   Melvyn Neth, [facility]

## 2022-10-13 NOTE — Research (Signed)
Trial Name:  [employer] 2021-05 - Specimen Collection Study to Evaluate Biomarkers in Subjects with Cancer    Patient Heather Mcintyre was identified by this nurse as a potential candidate for the above listed study.  This Clinical Research Nurse met with ARA GRANDMAISON, [id number] on 10/13/22 in a manner and location that ensures patient privacy to discuss participation in the above listed research study.  Patient is Unaccompanied.  Patient was previously provided with informed consent documents.  Patient confirmed they have read the informed consent documents.  As outlined in the informed consent form, this Nurse and Baron Hamper discussed the purpose of the research study, the investigational nature of the study, study procedures and requirements for study participation, potential risks and benefits of study participation, as well as alternatives to participation.  This study is not blinded or double-blinded. The patient understands participation is voluntary and they may withdraw from study participation at any time.  This study does not involve randomization.  This study does not involve an investigational drug or device. This study does not involve a placebo. Patient understands enrollment is pending full eligibility review.   Confidentiality and how the patient's information will be used as part of study participation were discussed.  Patient was informed there is not reimbursement provided for their time and effort spent on trial participation.  The patient is encouraged to discuss research study participation with their insurance provider to determine what costs they may incur as part of study participation, including research related injury.    All questions were answered to patient's satisfaction.  The informed consent with embedded HIPAA language was reviewed page by page.  The patient's mental and emotional status is appropriate to provide informed consent, and the patient verbalizes  an understanding of study participation.  Patient has agreed to participate in the above listed research study and has voluntarily signed the informed consent version 3 with embedded HIPAA language, version 3  on 10/13/22 at 1020AM.  The patient was provided with a copy of the signed informed consent form with embedded HIPAA language for their reference.  No study specific procedures were obtained prior to the signing of the informed consent document.  Approximately 20 minutes were spent with the patient reviewing the informed consent documents.  After obtaining informed consent patient, voluntarily signed the optional Release of Information form for use throughout trial participation.Second eligibility check for participation in the study was completed by Larina Bras, RN.  Patient was escorted to the lab by Mauricio Po, [place] for her scheduled lab appointment and gift card at completion.   Medical History:  High Blood Pressure  Yes Coronary Artery Disease No Lupus    No Rheumatoid Arthritis  Yes Diabetes   No      If yes, which type?      N/A Lynch Syndrome  No  Is the patient currently taking a magnesium supplement?   Yes If yes, dose and frequency? occasionally Indication? Multivitamin, unsure of dose or the name of it. Does not take daily though Start date? unknown  Does the patient have a personal history of cancer (greater than 5 years ago)?  Yes If yes, Cancer type and date of diagnosis?   Cervical Cancer 2002  Has this previous diagnosis been treated? yes      If so, treatment type? Surgical hysterectomy with cervix removal   Start and end dates of last treatment cycle? 2002  Does the patient have a family history of cancer in  1st or 2nd degree relatives? No If yes, Relationship(s) and Cancer type(s)? N/A   Does the patient have history of alcohol consumption? No   If yes, current or former? N/A  If former, year stopped? N/A Number of years? N/A Drinks per week? N/A  Does  the patient have history of cigarette, cigar, pipe, or chewing tobacco use?  No  If yes, current for former? N/A If yes, type (Cigarette, cigar, pipe, and/or chewing tobacco)? N/A   If former, year stopped? N/A Number of years? N/A Packs/number/containers per day? N/A

## 2022-10-16 ENCOUNTER — Other Ambulatory Visit: Payer: Self-pay | Admitting: Surgery

## 2022-10-16 ENCOUNTER — Telehealth: Payer: Self-pay | Admitting: Surgery

## 2022-10-16 DIAGNOSIS — R928 Other abnormal and inconclusive findings on diagnostic imaging of breast: Secondary | ICD-10-CM

## 2022-10-16 NOTE — Telephone Encounter (Signed)
Outgoing call, left message for patient to call.  Please inform her of the following regarding scheduled surgery.   Pre-Admission date/time, and Surgery date at [facility].  Surgery Date: 11/07/22 patient to arrive @ 7:30 am at [facility] as will be having SLN bx done prior to surgery.   Preadmission Testing Date: 10/30/22 (phone 8a-1p)

## 2022-10-17 NOTE — Telephone Encounter (Signed)
Called patient again, she is now aware of all dates regarding her surgery and her arrival time.  Patient verbalized understanding.

## 2022-10-26 ENCOUNTER — Other Ambulatory Visit: Payer: Self-pay | Admitting: Surgery

## 2022-10-26 DIAGNOSIS — C50919 Malignant neoplasm of unspecified site of unspecified female breast: Secondary | ICD-10-CM

## 2022-10-30 ENCOUNTER — Ambulatory Visit
Admission: RE | Admit: 2022-10-30 | Discharge: 2022-10-30 | Disposition: A | Discharge status: Home / Self Care | Payer: Medicare HMO | Source: Ambulatory Visit | Attending: Surgery | Admitting: Surgery

## 2022-10-30 ENCOUNTER — Encounter
Admission: RE | Admit: 2022-10-30 | Discharge: 2022-10-30 | Disposition: A | Discharge status: Home / Self Care | Payer: Medicare HMO | Source: Ambulatory Visit | Attending: Surgery | Admitting: Surgery

## 2022-10-30 ENCOUNTER — Other Ambulatory Visit: Payer: Self-pay

## 2022-10-30 ENCOUNTER — Encounter: Payer: Self-pay | Admitting: Urgent Care

## 2022-10-30 DIAGNOSIS — Z01812 Encounter for preprocedural laboratory examination: Secondary | ICD-10-CM | POA: Insufficient documentation

## 2022-10-30 DIAGNOSIS — C50919 Malignant neoplasm of unspecified site of unspecified female breast: Secondary | ICD-10-CM | POA: Insufficient documentation

## 2022-10-30 DIAGNOSIS — Z01810 Encounter for preprocedural cardiovascular examination: Secondary | ICD-10-CM

## 2022-10-30 HISTORY — DX: Other complications of anesthesia, initial encounter: T88.59XA

## 2022-10-30 HISTORY — DX: Anxiety disorder, unspecified: F41.9

## 2022-10-30 HISTORY — DX: Gastro-esophageal reflux disease without esophagitis: K21.9

## 2022-10-30 HISTORY — DX: Prediabetes: R73.03

## 2022-10-30 HISTORY — DX: Depression, unspecified: F32.A

## 2022-10-30 LAB — BASIC METABOLIC PANEL
Anion gap: 6 (ref 5–15)
BUN: 24 mg/dL — ABNORMAL HIGH (ref 6–20)
CO2: 29 mmol/L (ref 22–32)
Calcium: 9.4 mg/dL (ref 8.9–10.3)
Chloride: 107 mmol/L (ref 98–111)
Creatinine, Ser: 0.8 mg/dL (ref 0.44–1.00)
GFR, Estimated: >60 mL/min (ref >60)
Glucose, Bld: 82 mg/dL (ref 70–99)
Potassium: 3.5 mmol/L (ref 3.5–5.1)
Sodium: 142 mmol/L (ref 135–145)

## 2022-10-30 LAB — CBC
HCT: 39.2 % (ref 36.0–46.0)
Hemoglobin: 12.8 g/dL (ref 12.0–15.0)
MCH: 28.9 pg (ref 26.0–34.0)
MCHC: 32.7 g/dL (ref 30.0–36.0)
MCV: 88.5 fL (ref 80.0–100.0)
Platelets: 266 10*3/uL (ref 150–400)
RBC: 4.43 MIL/uL (ref 3.87–5.11)
RDW: 14.2 % (ref 11.5–15.5)
WBC: 5.7 10*3/uL (ref 4.0–10.5)
nRBC: 0 % (ref 0.0–0.2)

## 2022-10-30 MED ORDER — LIDOCAINE HCL (PF) 1 % IJ SOLN
10.0000 mL | Freq: Once | INTRAMUSCULAR | Status: AC
  Administered 2022-10-30: 10 mL
  Filled 2022-10-30: qty 10

## 2022-10-30 NOTE — Patient Instructions (Addendum)
Your procedure is scheduled on: 11/07/22 - Tuesday Report to the Registration Desk on the 1st floor of the [facility]. To find out your arrival time, please call [phone] between 1PM - 3PM on: 11/06/22 - Monday If your arrival time is 6:00 am, do not arrive prior to that time as the [facility] entrance doors do not open until 6:00 am.  REMEMBER: Instructions that are not followed completely may result in serious medical risk, up to and including death; or upon the discretion of your surgeon and anesthesiologist your surgery may need to be rescheduled.  Do not eat food after midnight the night before surgery.  No gum chewing, lozengers or hard candies.  You may however, drink CLEAR liquids up to 2 hours before you are scheduled to arrive for your surgery. Do not drink anything within 2 hours of your scheduled arrival time.  Clear liquids include: - water  - apple juice without pulp - gatorade (not RED colors) - black coffee or tea (Do NOT add milk or creamers to the coffee or tea) Do NOT drink anything that is not on this list.   TAKE THESE MEDICATIONS THE MORNING OF SURGERY WITH A SIP OF WATER: - hydrOXYzine (ATARAX)  - omeprazole (PRILOSEC)   Hold aspirin EC beginning 11/01/22.   One week prior to surgery: Stop Anti-inflammatories (NSAIDS) such as Advil, Aleve, Ibuprofen, Motrin, Naproxen, Naprosyn and Aspirin based products such as Excedrin, Goodys Powder, BC Powder.  Stop ANY OVER THE COUNTER supplements until after surgery beginning 10/31/22.  You may however, continue to take Tylenol if needed for pain up until the day of surgery.  No Alcohol for 24 hours before or after surgery.  No Smoking including e-cigarettes for 24 hours prior to surgery.  No chewable tobacco products for at least 6 hours prior to surgery.  No nicotine patches on the day of surgery.  Do not use any "recreational" drugs for at least a week prior to your surgery.  Please be advised that  the combination of cocaine and anesthesia may have negative outcomes, up to and including death. If you test positive for cocaine, your surgery will be cancelled.  On the morning of surgery brush your teeth with toothpaste and water, you may rinse your mouth with mouthwash if you wish.Do not swallow any toothpaste or mouthwash.  Use CHG Soap or wipes as directed on instruction sheet.  Do not wear jewelry, make-up, hairpins, clips or nail polish.  Do not wear lotions, powders, or perfumes.   Do not shave body from the neck down 48 hours prior to surgery just in case you cut yourself which could leave a site for infection. Also, freshly shaved skin may become irritated if using the CHG soap.  Contact lenses, hearing aids and dentures may not be worn into surgery.  Do not bring valuables to the hospital. [facility] is not responsible for any missing/lost belongings or valuables.   Notify your doctor if there is any change in your medical condition (cold, fever, infection).  Wear comfortable clothing (specific to your surgery type) to the hospital.  After surgery, you can help prevent lung complications by doing breathing exercises.  Take deep breaths and cough every 1-2 hours. Your doctor may order a device called an Incentive Spirometer to help you take deep breaths. When coughing or sneezing, hold a pillow firmly against your incision with both hands. This is called "splinting." Doing this helps protect your incision. It also decreases belly discomfort.  If you are being admitted to the hospital overnight, leave your suitcase in the car. After surgery it may be brought to your room.  If you are being discharged the day of surgery, you will not be allowed to drive home. You will need a responsible adult (18 years or older) to drive you home and stay with you that night.   If you are taking public transportation, you will need to have a responsible adult (18 years or older) with  you. Please confirm with your physician that it is acceptable to use public transportation.   Please call the [facility] Dept. at [phone] if you have any questions about these instructions.  Surgery Visitation Policy:  Patients undergoing a surgery or procedure may have two family members or support persons with them as long as the person is not COVID-19 positive or experiencing its symptoms.   Inpatient Visitation:    Visiting hours are 7 a.m. to 8 p.m. Up to four visitors are allowed at one time in a patient room. The visitors may rotate out with other people during the day. One designated support person (adult) may remain overnight.  MASKING: Due to an increase in RSV rates and hospitalizations, in-patient care areas in which we serve newborns, infants and children, masks will be required for teammates and visitors.  Children ages 35 and under may not visit. This policy affects the following departments only:  [place] Postpartum area Mother Baby Unit Newborn nursery/Special care nursery  Other areas: Masks continue to be strongly recommended for [facility] teammates, visitors and patients in all other areas. Visitation is not restricted outside of the units listed above.

## 2022-11-07 ENCOUNTER — Ambulatory Visit: Payer: Medicare HMO | Admitting: Registered Nurse

## 2022-11-07 ENCOUNTER — Other Ambulatory Visit: Payer: Self-pay

## 2022-11-07 ENCOUNTER — Ambulatory Visit
Admission: RE | Admit: 2022-11-07 | Discharge: 2022-11-07 | Disposition: A | Discharge status: Home / Self Care | Payer: Medicare HMO | Source: Ambulatory Visit | Attending: Surgery | Admitting: Surgery

## 2022-11-07 ENCOUNTER — Encounter: Payer: Self-pay | Admitting: Surgery

## 2022-11-07 ENCOUNTER — Encounter
Admission: RE | Disposition: A | Discharge status: Home / Self Care | Payer: Self-pay | Source: Home / Self Care | Attending: Surgery

## 2022-11-07 ENCOUNTER — Ambulatory Visit
Admission: RE | Admit: 2022-11-07 | Discharge: 2022-11-07 | Disposition: A | Discharge status: Home / Self Care | Payer: Medicare HMO | Attending: Surgery | Admitting: Surgery

## 2022-11-07 DIAGNOSIS — M1990 Unspecified osteoarthritis, unspecified site: Secondary | ICD-10-CM | POA: Diagnosis not present

## 2022-11-07 DIAGNOSIS — C50412 Malignant neoplasm of upper-outer quadrant of left female breast: Secondary | ICD-10-CM | POA: Insufficient documentation

## 2022-11-07 DIAGNOSIS — C50912 Malignant neoplasm of unspecified site of left female breast: Secondary | ICD-10-CM | POA: Diagnosis not present

## 2022-11-07 DIAGNOSIS — Z170 Estrogen receptor positive status [ER+]: Secondary | ICD-10-CM | POA: Insufficient documentation

## 2022-11-07 DIAGNOSIS — J45909 Unspecified asthma, uncomplicated: Secondary | ICD-10-CM | POA: Diagnosis not present

## 2022-11-07 DIAGNOSIS — I10 Essential (primary) hypertension: Secondary | ICD-10-CM | POA: Diagnosis not present

## 2022-11-07 DIAGNOSIS — R928 Other abnormal and inconclusive findings on diagnostic imaging of breast: Secondary | ICD-10-CM

## 2022-11-07 HISTORY — PX: BREAST LUMPECTOMY,RADIO FREQ LOCALIZER,AXILLARY SENTINEL LYMPH NODE BIOPSY: SHX6900

## 2022-11-07 SURGERY — BREAST LUMPECTOMY,RADIO FREQ LOCALIZER,AXILLARY SENTINEL LYMPH NODE BIOPSY
Anesthesia: General | Site: Breast | Laterality: Left

## 2022-11-07 MED ORDER — CHLORHEXIDINE GLUCONATE 0.12 % MT SOLN
OROMUCOSAL | Status: AC
  Administered 2022-11-07: 15 mL via OROMUCOSAL
  Filled 2022-11-07: qty 15

## 2022-11-07 MED ORDER — PHENYLEPHRINE 80 MCG/ML (10ML) SYRINGE FOR IV PUSH (FOR BLOOD PRESSURE SUPPORT)
PREFILLED_SYRINGE | INTRAVENOUS | Status: AC
  Filled 2022-11-07: qty 10

## 2022-11-07 MED ORDER — SUGAMMADEX SODIUM 500 MG/5ML IV SOLN
INTRAVENOUS | Status: AC
  Filled 2022-11-07: qty 5

## 2022-11-07 MED ORDER — DEXAMETHASONE SODIUM PHOSPHATE 10 MG/ML IJ SOLN
INTRAMUSCULAR | Status: AC
  Filled 2022-11-07: qty 1

## 2022-11-07 MED ORDER — ROCURONIUM BROMIDE 10 MG/ML (PF) SYRINGE
PREFILLED_SYRINGE | INTRAVENOUS | Status: AC
  Filled 2022-11-07: qty 10

## 2022-11-07 MED ORDER — OXYCODONE HCL 5 MG PO TABS
5.0000 mg | ORAL_TABLET | Freq: Once | ORAL | Status: AC | PRN
  Administered 2022-11-07: 5 mg via ORAL

## 2022-11-07 MED ORDER — EPHEDRINE SULFATE (PRESSORS) 50 MG/ML IJ SOLN
INTRAMUSCULAR | Status: DC | PRN
  Administered 2022-11-07 (×2): 10 mg via INTRAVENOUS
  Administered 2022-11-07 (×2): 5 mg via INTRAVENOUS

## 2022-11-07 MED ORDER — PROPOFOL 1000 MG/100ML IV EMUL
INTRAVENOUS | Status: AC
  Filled 2022-11-07: qty 100

## 2022-11-07 MED ORDER — ORAL CARE MOUTH RINSE: 15.0000 mL | Freq: Once | OROMUCOSAL | Status: AC

## 2022-11-07 MED ORDER — PHENYLEPHRINE HCL-NACL 20-0.9 MG/250ML-% IV SOLN
INTRAVENOUS | Status: AC
  Filled 2022-11-07: qty 250

## 2022-11-07 MED ORDER — LACTATED RINGERS IV SOLN: INTRAVENOUS | Status: DC

## 2022-11-07 MED ORDER — METHYLENE BLUE 1 % INJ SOLN
INTRAVENOUS | Status: AC
  Filled 2022-11-07: qty 10

## 2022-11-07 MED ORDER — OXYCODONE HCL 5 MG PO TABS
ORAL_TABLET | ORAL | Status: AC
  Filled 2022-11-07: qty 1

## 2022-11-07 MED ORDER — PROPOFOL 10 MG/ML IV BOLUS
INTRAVENOUS | Status: AC
  Filled 2022-11-07: qty 20

## 2022-11-07 MED ORDER — KETOROLAC TROMETHAMINE 30 MG/ML IJ SOLN
INTRAMUSCULAR | Status: DC | PRN
  Administered 2022-11-07: 30 mg via INTRAVENOUS

## 2022-11-07 MED ORDER — FENTANYL CITRATE (PF) 100 MCG/2ML IJ SOLN
25.0000 ug | INTRAMUSCULAR | Status: DC | PRN
  Administered 2022-11-07 (×2): 50 ug via INTRAVENOUS

## 2022-11-07 MED ORDER — STERILE WATER FOR IRRIGATION IR SOLN
Status: DC | PRN
  Administered 2022-11-07: 400 mL

## 2022-11-07 MED ORDER — LIDOCAINE HCL (CARDIAC) PF 100 MG/5ML IV SOSY
PREFILLED_SYRINGE | INTRAVENOUS | Status: DC | PRN
  Administered 2022-11-07: 80 mg via INTRAVENOUS

## 2022-11-07 MED ORDER — ROCURONIUM BROMIDE 100 MG/10ML IV SOLN
INTRAVENOUS | Status: DC | PRN
  Administered 2022-11-07: 70 mg via INTRAVENOUS
  Administered 2022-11-07: 10 mg via INTRAVENOUS

## 2022-11-07 MED ORDER — TECHNETIUM TC 99M TILMANOCEPT KIT
961.3700 | PACK | Freq: Once | INTRAVENOUS | Status: AC | PRN
  Administered 2022-11-07: 961.37 via INTRADERMAL

## 2022-11-07 MED ORDER — ACETAMINOPHEN 500 MG PO TABS
ORAL_TABLET | ORAL | Status: AC
  Administered 2022-11-07: 1000 mg via ORAL
  Filled 2022-11-07: qty 2

## 2022-11-07 MED ORDER — ACETAMINOPHEN 500 MG PO TABS: 1000.0000 mg | ORAL_TABLET | ORAL | Status: AC

## 2022-11-07 MED ORDER — GABAPENTIN 300 MG PO CAPS: 300.0000 mg | ORAL_CAPSULE | ORAL | Status: AC

## 2022-11-07 MED ORDER — BUPIVACAINE LIPOSOME 1.3 % IJ SUSP: 20.0000 mL | Freq: Once | INTRAMUSCULAR | Status: DC

## 2022-11-07 MED ORDER — SODIUM CHLORIDE FLUSH 0.9 % IV SOLN
INTRAVENOUS | Status: AC
  Filled 2022-11-07: qty 10

## 2022-11-07 MED ORDER — FENTANYL CITRATE (PF) 100 MCG/2ML IJ SOLN
INTRAMUSCULAR | Status: AC
  Filled 2022-11-07: qty 2

## 2022-11-07 MED ORDER — CIPROFLOXACIN IN D5W 400 MG/200ML IV SOLN
400.0000 mg | INTRAVENOUS | Status: AC
  Administered 2022-11-07: 400 mg via INTRAVENOUS

## 2022-11-07 MED ORDER — BUPIVACAINE LIPOSOME 1.3 % IJ SUSP
INTRAMUSCULAR | Status: DC | PRN
  Administered 2022-11-07: 50 mL

## 2022-11-07 MED ORDER — PHENYLEPHRINE 80 MCG/ML (10ML) SYRINGE FOR IV PUSH (FOR BLOOD PRESSURE SUPPORT)
PREFILLED_SYRINGE | INTRAVENOUS | Status: DC | PRN
  Administered 2022-11-07: 80 ug via INTRAVENOUS

## 2022-11-07 MED ORDER — SUGAMMADEX SODIUM 500 MG/5ML IV SOLN
INTRAVENOUS | Status: DC | PRN
  Administered 2022-11-07: 242.2 mg via INTRAVENOUS

## 2022-11-07 MED ORDER — ACETAMINOPHEN 500 MG PO TABS: 1000.0000 mg | ORAL_TABLET | Freq: Four times a day (QID) | ORAL | Status: AC | PRN

## 2022-11-07 MED ORDER — CIPROFLOXACIN IN D5W 400 MG/200ML IV SOLN
INTRAVENOUS | Status: AC
  Filled 2022-11-07: qty 200

## 2022-11-07 MED ORDER — DEXAMETHASONE SODIUM PHOSPHATE 10 MG/ML IJ SOLN
INTRAMUSCULAR | Status: DC | PRN
  Administered 2022-11-07: 8 mg via INTRAVENOUS

## 2022-11-07 MED ORDER — BUPIVACAINE-EPINEPHRINE (PF) 0.5% -1:200000 IJ SOLN
INTRAMUSCULAR | Status: AC
  Filled 2022-11-07: qty 30

## 2022-11-07 MED ORDER — KETOROLAC TROMETHAMINE 30 MG/ML IJ SOLN
INTRAMUSCULAR | Status: AC
  Filled 2022-11-07: qty 1

## 2022-11-07 MED ORDER — BUPIVACAINE LIPOSOME 1.3 % IJ SUSP
INTRAMUSCULAR | Status: AC
  Filled 2022-11-07: qty 10

## 2022-11-07 MED ORDER — MIDAZOLAM HCL 2 MG/2ML IJ SOLN
INTRAMUSCULAR | Status: DC | PRN
  Administered 2022-11-07: 2 mg via INTRAVENOUS

## 2022-11-07 MED ORDER — CHLORHEXIDINE GLUCONATE CLOTH 2 % EX PADS
6.0000 | MEDICATED_PAD | Freq: Once | CUTANEOUS | Status: AC
  Administered 2022-11-07: 6 via TOPICAL

## 2022-11-07 MED ORDER — ONDANSETRON HCL 4 MG/2ML IJ SOLN
INTRAMUSCULAR | Status: AC
  Filled 2022-11-07: qty 2

## 2022-11-07 MED ORDER — EPHEDRINE 5 MG/ML INJ
INTRAVENOUS | Status: AC
  Filled 2022-11-07: qty 5

## 2022-11-07 MED ORDER — FENTANYL CITRATE (PF) 100 MCG/2ML IJ SOLN
INTRAMUSCULAR | Status: DC | PRN
  Administered 2022-11-07 (×2): 25 ug via INTRAVENOUS
  Administered 2022-11-07: 50 ug via INTRAVENOUS

## 2022-11-07 MED ORDER — CHLORHEXIDINE GLUCONATE 0.12 % MT SOLN: 15.0000 mL | Freq: Once | OROMUCOSAL | Status: AC

## 2022-11-07 MED ORDER — ONDANSETRON HCL 4 MG/2ML IJ SOLN
INTRAMUSCULAR | Status: DC | PRN
  Administered 2022-11-07: 4 mg via INTRAVENOUS

## 2022-11-07 MED ORDER — PROPOFOL 10 MG/ML IV BOLUS
INTRAVENOUS | Status: DC | PRN
  Administered 2022-11-07: 200 mg via INTRAVENOUS

## 2022-11-07 MED ORDER — SODIUM CHLORIDE (PF) 0.9 % IJ SOLN
INTRAMUSCULAR | Status: AC
  Filled 2022-11-07: qty 10

## 2022-11-07 MED ORDER — CELECOXIB 200 MG PO CAPS: 200.0000 mg | ORAL_CAPSULE | Freq: Two times a day (BID) | ORAL | 1 refills | Status: AC | PRN

## 2022-11-07 MED ORDER — GABAPENTIN 300 MG PO CAPS
ORAL_CAPSULE | ORAL | Status: AC
  Administered 2022-11-07: 300 mg via ORAL
  Filled 2022-11-07: qty 1

## 2022-11-07 MED ORDER — PHENYLEPHRINE HCL (PRESSORS) 10 MG/ML IV SOLN
INTRAVENOUS | Status: DC | PRN
  Administered 2022-11-07: 80 ug via INTRAVENOUS

## 2022-11-07 MED ORDER — PHENYLEPHRINE HCL-NACL 20-0.9 MG/250ML-% IV SOLN
INTRAVENOUS | Status: DC | PRN
  Administered 2022-11-07: 20 ug/min via INTRAVENOUS

## 2022-11-07 MED ORDER — OXYCODONE HCL 5 MG/5ML PO SOLN: 5.0000 mg | Freq: Once | ORAL | Status: AC | PRN

## 2022-11-07 MED ORDER — SODIUM CHLORIDE (PF) 0.9 % IJ SOLN
INTRAVENOUS | Status: DC | PRN
  Administered 2022-11-07: 5 mL

## 2022-11-07 MED ORDER — CHLORHEXIDINE GLUCONATE CLOTH 2 % EX PADS: 6.0000 | MEDICATED_PAD | Freq: Once | CUTANEOUS | Status: DC

## 2022-11-07 MED ORDER — OXYCODONE HCL 5 MG PO TABS: 5.0000 mg | ORAL_TABLET | ORAL | 0 refills | Status: DC | PRN

## 2022-11-07 MED ORDER — MIDAZOLAM HCL 2 MG/2ML IJ SOLN
INTRAMUSCULAR | Status: AC
  Filled 2022-11-07: qty 2

## 2022-11-07 SURGICAL SUPPLY — 40 items
ADH SKN CLS APL DERMABOND .7 (GAUZE/BANDAGES/DRESSINGS) ×1
APL PRP STRL LF DISP 70% ISPRP (MISCELLANEOUS) ×1
BINDER BREAST XXLRG (GAUZE/BANDAGES/DRESSINGS) IMPLANT
BLADE PHOTON ILLUMINATED (MISCELLANEOUS) ×1 IMPLANT
BLADE SURG 15 STRL LF DISP TIS (BLADE) ×2 IMPLANT
BLADE SURG 15 STRL SS (BLADE) ×2
CHLORAPREP W/TINT 26 (MISCELLANEOUS) ×1 IMPLANT
COVER PROBE GAMMA FINDER SLV (MISCELLANEOUS) IMPLANT
COVER PROBE ULTRASOUND 5X96 (MISCELLANEOUS) IMPLANT
DERMABOND ADVANCED .7 DNX12 (GAUZE/BANDAGES/DRESSINGS) ×1 IMPLANT
DEVICE DUBIN SPECIMEN MAMMOGRA (MISCELLANEOUS) ×1 IMPLANT
DRAPE LAPAROTOMY 100X77 ABD (DRAPES) ×1 IMPLANT
DRSG GAUZE FLUFF 36X18 (GAUZE/BANDAGES/DRESSINGS) ×1 IMPLANT
ELECT REM PT RETURN 9FT ADLT (ELECTROSURGICAL) ×1
ELECTRODE REM PT RTRN 9FT ADLT (ELECTROSURGICAL) ×1 IMPLANT
GAUZE 4X4 16PLY ~~LOC~~+RFID DBL (SPONGE) ×1 IMPLANT
GLOVE SURG SYN 7.0 (GLOVE) ×1 IMPLANT
GLOVE SURG SYN 7.0 PF PI (GLOVE) ×1 IMPLANT
GLOVE SURG SYN 7.5  E (GLOVE) ×1
GLOVE SURG SYN 7.5 E (GLOVE) ×1 IMPLANT
GLOVE SURG SYN 7.5 PF PI (GLOVE) ×1 IMPLANT
GOWN STRL REUS W/ TWL LRG LVL3 (GOWN DISPOSABLE) ×2 IMPLANT
GOWN STRL REUS W/TWL LRG LVL3 (GOWN DISPOSABLE) ×2
KIT MARKER MARGIN INK (KITS) IMPLANT
KIT TURNOVER KIT A (KITS) ×1 IMPLANT
LABEL OR SOLS (LABEL) ×1 IMPLANT
MANIFOLD NEPTUNE II (INSTRUMENTS) ×1 IMPLANT
NEEDLE HYPO 22GX1.5 SAFETY (NEEDLE) ×1 IMPLANT
PACK BASIN MINOR ARMC (MISCELLANEOUS) ×1 IMPLANT
SUT MNCRL 4-0 (SUTURE) ×1
SUT MNCRL 4-0 27XMFL (SUTURE) ×1
SUT SILK 3 0 SH 30 (SUTURE) IMPLANT
SUT VIC AB 3-0 SH 27 (SUTURE) ×3
SUT VIC AB 3-0 SH 27X BRD (SUTURE) ×1 IMPLANT
SUTURE MNCRL 4-0 27XMF (SUTURE) ×1 IMPLANT
SYR 10ML LL (SYRINGE) ×1 IMPLANT
TAPE TRANSPORE STRL 2 31045 (GAUZE/BANDAGES/DRESSINGS) IMPLANT
TRAP NEPTUNE SPECIMEN COLLECT (MISCELLANEOUS) ×1 IMPLANT
WATER STERILE IRR 1000ML POUR (IV SOLUTION) ×1 IMPLANT
WATER STERILE IRR 500ML POUR (IV SOLUTION) ×1 IMPLANT

## 2022-11-07 NOTE — Anesthesia Postprocedure Evaluation (Signed)
Anesthesia Post Note  Patient: Heather Mcintyre  Procedure(s) Performed: BREAST LUMPECTOMY,RADIO FREQ LOCALIZER,AXILLARY SENTINEL LYMPH NODE BIOPSY (Left: Breast)  Patient location during evaluation: PACU Anesthesia Type: General Level of consciousness: awake and alert Pain management: pain level controlled Vital Signs Assessment: post-procedure vital signs reviewed and stable Respiratory status: spontaneous breathing, nonlabored ventilation, respiratory function stable and patient connected to nasal cannula oxygen Cardiovascular status: blood pressure returned to baseline and stable Postop Assessment: no apparent nausea or vomiting Anesthetic complications: no  No notable events documented.   Last Vitals:  Vitals:   11/07/22 1330 11/07/22 1356  BP: (!) 166/88 (!) 162/96  Pulse: 68 77  Resp: 10 16  Temp: 36.4 C (!) 36.1 C  SpO2: 100% 97%    Last Pain:  Vitals:   11/07/22 1356  TempSrc: Temporal  PainSc:                  Dimas Millin

## 2022-11-07 NOTE — Anesthesia Procedure Notes (Signed)
Procedure Name: Intubation Date/Time: 11/07/2022 10:10 AM  Performed by: Jacqualin Combes, CRNAPre-anesthesia Checklist: Patient identified, Emergency Drugs available, Suction available and Patient being monitored Patient Re-evaluated:Patient Re-evaluated prior to induction Oxygen Delivery Method: Circle system utilized Preoxygenation: Pre-oxygenation with 100% oxygen Induction Type: IV induction Ventilation: Mask ventilation without difficulty Laryngoscope Size: 4 and Mac Grade View: Grade I Tube type: Oral Tube size: 7.5 mm Number of attempts: 1 Airway Equipment and Method: Stylet and Oral airway Placement Confirmation: ETT inserted through vocal cords under direct vision, positive ETCO2 and breath sounds checked- equal and bilateral Secured at: 23 cm Tube secured with: Tape Dental Injury: Teeth and Oropharynx as per pre-operative assessment  Comments: Cords clear. CA

## 2022-11-07 NOTE — Op Note (Signed)
Procedure Date:  11/07/2022  Pre-operative Diagnosis:  Left breast cancer  Post-operative Diagnosis: Left breast cancer  Procedure:  Left breast Magseed tag-localized lumpectomy and sentinel lymph node biopsy  Surgeon:  Melvyn Neth, MD  Anesthesia:  General endotracheal  Estimated Blood Loss:  10 ml  Specimens:   Sentinel lymph node #1, hot and blue, count 1023 Sentinel lymph node #2, hot and blue, count 168 Sentinel lymph node #3, hot and blue, count 629  Complications:  None  Operation performed with curative intent:Yes  Tracer(s) used to identify sentinel nodes in the upfront surgery (non-neoadjuvant) setting (select all that apply):Dye and Radioactive Tracer  Tracer(s) used to identify sentinel nodes in the neoadjuvant setting (select all that apply):N/A  All nodes (colored or non-colored) present at the end of a dye-filled lymphatic channel were removed:Yes   All significantly radioactive nodes were removed:Yes  All palpable suspicious nodes were removed:N/A  Biopsy-proven positive nodes marked with clips prior to chemotherapy were identified and removed:N/A  Indications for Procedure:  This is a 56 y.o. female who presents with newly diagnosed left breast cancer.  The risks of bleeding, infection, injury to surrounding structures, hematoma, seroma, open wound, cosmetic deformity, and the need for further surgery were all discussed with the patient and was willing to proceed.  Prior to this procedure, the patient had undergone Magseed tag localization and sentinel lymphoscintigraphy.  Description of Procedure: The patient was correctly identified in the preoperative area and brought into the operating room.  The patient was placed supine with VTE prophylaxis in place.  Appropriate time-outs were performed.  Anesthesia was induced and the patient was intubated.  Appropriate antibiotics were infused.  A visual dye was injected in the left periareolar region under  aseptic conditions. The left chest and axilla were prepped and draped in usual sterile fashion.  Then using the hand-held probe an area of high counts was identified in the axilla, and a 5 cm incision was made.  Cautery was used to dissect down the subcutaneous tissue and the hand-held probe was used to guide dissection. A hot and blue lymph node was identified and resected.  This had a count of 1023.  Additional lymph nodes were identified and resected with counts of 168 and 244.  Both were hot and blue as well.  The wound bed had a count of 8.  The cavity was irrigated and hemostasis was assured with electrocautery.  Local anesthetic was infiltrated into the skin and subcutaneous tissue of the cavity.  The wound was then closed in three layers with 3-0 Vicryl and 4-0 Monocryl and sealed with DermaBond.  Attention was turned to the [facility] tag localization site which was determined using the Sentimag probe.  An incision was made overlying the tag and clip.  Sentimag probe was used to guide our dissection using electrocautery, and a partial mastectomy was performed with adequate margins.  MarginMarker was used to ink each of the sides of the specimen.  The specimen was then imaged to confirm that the area of concern, biopsy clip, and Magseed tag were included in the excision.  This was then sent to pathology.  The cavity was irrigated and hemostasis was assured with electrocautery.  Local anesthetic was infiltrated into the skin and subcutaneous tissue of the cavity.  The wound was then closed in three layers with 3-0 Vicryl and 4-0 Monocryl and sealed with DermaBond.  The patient was emerged from anesthesia and extubated and brought to the recovery room for further  management.  The patient tolerated the procedure well and all counts were correct at the end of the case.   Melvyn Neth, MD

## 2022-11-07 NOTE — Discharge Instructions (Addendum)
Discharge Instructions 1.  Patient may shower, but do not scrub wounds heavily and dab dry only. 2.  Do not submerge wounds in pool/tub until fully healed. 3.  Do not apply ointments or hydrogen peroxide to the wounds. 4.  May apply ice packs to the wounds for comfort. 5.  Please wear breast binder at all times for the next two weeks.  May remove for showers.  After two weeks, can transition to a sports bra for an additional 2 weeks, and then transition to your normal bra. 6.  May apply fluffed gauze over the incisions for padding/comfort. 7.  May resume your Aspirin on 11/09/22. 8.  Do not drive while taking narcotics for pain control.  Prior to driving, make sure you are able to rotate right and left to look at blindspots without significant pain or discomfort. 9.  Avoid strenuous activity with the left arm for two weeks.  AMBULATORY SURGERY  DISCHARGE INSTRUCTIONS   The drugs that you were given will stay in your system until tomorrow so for the next 24 hours you should not:  Drive an automobile Make any legal decisions Drink any alcoholic beverage   You may resume regular meals tomorrow.  Today it is better to start with liquids and gradually work up to solid foods.  You may eat anything you prefer, but it is better to start with liquids, then soup and crackers, and gradually work up to solid foods.   Please notify your doctor immediately if you have any unusual bleeding, trouble breathing, redness and pain at the surgery site, drainage, fever, or pain not relieved by medication.    Additional Instructions:  1 part rubbing alcohol 2 parts water in ziplock freeze use for comfort      Please contact your physician with any problems or Same Day Surgery at [phone], Monday through Friday 6 am to 4 pm, or [place] at [facility] number at [phone].

## 2022-11-07 NOTE — Anesthesia Preprocedure Evaluation (Signed)
Anesthesia Evaluation  Patient identified by MRN, date of birth, ID band Patient awake    Reviewed: Allergy & Precautions, NPO status , Patient's Chart, lab work & pertinent test results  History of Anesthesia Complications (+) AWARENESS UNDER ANESTHESIA and history of anesthetic complications  Airway Mallampati: IV  TM Distance: >3 FB Neck ROM: full    Dental  (+) Dental Advidsory Given, Poor Dentition   Pulmonary neg shortness of breath, asthma , Not current smoker   Pulmonary exam normal        Cardiovascular hypertension, (-) Past MI and (-) CABG negative cardio ROS Normal cardiovascular exam     Neuro/Psych  PSYCHIATRIC DISORDERS      negative neurological ROS     GI/Hepatic negative GI ROS, Neg liver ROS,,,  Endo/Other  negative endocrine ROS    Renal/GU      Musculoskeletal   Abdominal   Peds  Hematology negative hematology ROS (+)   Anesthesia Other Findings Past Medical History: No date: Anxiety No date: Asthma No date: Breast cancer ([place]) No date: Cervical cancer ([place]) No date: Complication of anesthesia     Comment:  woke up in the middle of surgery 2010 No date: Degenerative joint disease/osteoarthritis No date: Depression No date: GERD (gastroesophageal reflux disease) No date: Hypertension No date: Lupus ([place]) No date: Pre-diabetes  Past Surgical History: No date: ABDOMINAL HYSTERECTOMY 10/04/2022: BREAST BIOPSY; Left     Comment:  [place] LT BREAST BX W LOC DEV 1ST LESION IMG BX [place]               GUIDE 10/04/2022 [facility]-MAMMOGRAPHY No date: CHOLECYSTECTOMY; N/A No date: COLONOSCOPY WITH ESOPHAGOGASTRODUODENOSCOPY (EGD) No date: JOINT REPLACEMENT No date: LAPAROSCOPIC GASTRIC BAND REMOVAL WITH LAPAROSCOPIC GASTRIC  SLEEVE RESECTION No date: REPLACEMENT TOTAL KNEE BILATERAL No date: TUBAL LIGATION  BMI    Body Mass Index: 40.58 kg/m      Reproductive/Obstetrics negative OB ROS                              Anesthesia Physical Anesthesia Plan  ASA: 3  Anesthesia Plan: General ETT   Post-op Pain Management:    Induction: Intravenous  PONV Risk Score and Plan: 3 and Ondansetron, Dexamethasone and Midazolam  Airway Management Planned: Oral ETT  Additional Equipment:   Intra-op Plan:   Post-operative Plan: Extubation in OR  Informed Consent: I have reviewed the patients History and Physical, chart, labs and discussed the procedure including the risks, benefits and alternatives for the proposed anesthesia with the patient or authorized representative who has indicated his/her understanding and acceptance.     Dental Advisory Given  Plan Discussed with: Anesthesiologist, CRNA and Surgeon  Anesthesia Plan Comments: (Patient consented for risks of anesthesia including but not limited to:  - adverse reactions to medications - damage to eyes, teeth, lips or other oral mucosa - nerve damage due to positioning  - sore throat or hoarseness - Damage to heart, brain, nerves, lungs, other parts of body or loss of life  Patient voiced understanding.)       Anesthesia Quick Evaluation

## 2022-11-07 NOTE — Transfer of Care (Signed)
Immediate Anesthesia Transfer of Care Note  Patient: Heather Mcintyre  Procedure(s) Performed: BREAST LUMPECTOMY,RADIO FREQ LOCALIZER,AXILLARY SENTINEL LYMPH NODE BIOPSY (Left: Breast)  Patient Location: PACU  Anesthesia Type:General  Level of Consciousness: awake, alert  and oriented  Airway & Oxygen Therapy: Patient Spontanous Breathing and Patient connected to face mask oxygen  Post-op Assessment: Report given to RN and Post -op Vital signs reviewed and stable  Post vital signs: stable  Last Vitals:  Vitals Value Taken Time  BP 157/80 11/07/22 1230  Temp    Pulse 78 11/07/22 1233  Resp 14 11/07/22 1233  SpO2 100 % 11/07/22 1233  Vitals shown include unvalidated device data.  Last Pain:  Vitals:   11/07/22 0913  TempSrc: Temporal  PainSc: 9          Complications: No notable events documented.

## 2022-11-07 NOTE — Interval H&P Note (Signed)
History and Physical Interval Note:  11/07/2022 9:24 AM  Heather Mcintyre  has presented today for surgery, with the diagnosis of Left Breast Cancer, C50.912.  The various methods of treatment have been discussed with the patient and family. After consideration of risks, benefits and other options for treatment, the patient has consented to  Procedure(s): [place] (Left) as a surgical intervention.  The patient's history has been reviewed, patient examined, no change in status, stable for surgery.  I have reviewed the patient's chart and labs.  Questions were answered to the patient's satisfaction.     Tiquan Bouch

## 2022-11-08 ENCOUNTER — Encounter: Payer: Self-pay | Admitting: Surgery

## 2022-11-10 ENCOUNTER — Encounter: Payer: Self-pay | Admitting: *Deleted

## 2022-11-10 DIAGNOSIS — C50912 Malignant neoplasm of unspecified site of left female breast: Secondary | ICD-10-CM

## 2022-11-10 NOTE — Progress Notes (Addendum)
Oncotype Dx order [id number] submitted online on path [id number].  Referral sent to radiation oncology.  Ms. Heather Mcintyre will see Dr. Baruch Gouty on 1/3 after she sees Dr. Grayland Ormond. Appt. Details given to patient.   She also said she is having some redness and burning under the breast that started the day after surgery.  Instructed her to call Dr. Mont Dutton office and let them know her symptoms.  They may call her in something or have her come in so they can check her.  She said she would call as soon as she hung up with me.

## 2022-11-10 NOTE — Progress Notes (Signed)
11/10/22  Called patient today to inform her of the pathology results.  Overall all lymph nodes removed are negative for cancer.  All margins on the breast specimen are clear.  Patient has follow up with me on 11/24/22 and with Dr. Grayland Ormond on 11/29/22 and new appointment with Dr. Baruch Gouty on 11/29/22.  Olean Ree, MD

## 2022-11-24 ENCOUNTER — Encounter: Payer: Self-pay | Admitting: Surgery

## 2022-11-24 ENCOUNTER — Other Ambulatory Visit: Payer: Self-pay

## 2022-11-24 ENCOUNTER — Ambulatory Visit (INDEPENDENT_AMBULATORY_CARE_PROVIDER_SITE_OTHER): Payer: Medicare HMO | Admitting: Surgery

## 2022-11-24 VITALS — BP 139/85 | HR 75 | Temp 98.5°F | Ht 68.0 in | Wt 268.0 lb

## 2022-11-24 DIAGNOSIS — M47816 Spondylosis without myelopathy or radiculopathy, lumbar region: Secondary | ICD-10-CM | POA: Insufficient documentation

## 2022-11-24 DIAGNOSIS — M2142 Flat foot [pes planus] (acquired), left foot: Secondary | ICD-10-CM | POA: Insufficient documentation

## 2022-11-24 DIAGNOSIS — F418 Other specified anxiety disorders: Secondary | ICD-10-CM | POA: Insufficient documentation

## 2022-11-24 DIAGNOSIS — E669 Obesity, unspecified: Secondary | ICD-10-CM | POA: Insufficient documentation

## 2022-11-24 DIAGNOSIS — Z08 Encounter for follow-up examination after completed treatment for malignant neoplasm: Secondary | ICD-10-CM

## 2022-11-24 DIAGNOSIS — Z09 Encounter for follow-up examination after completed treatment for conditions other than malignant neoplasm: Secondary | ICD-10-CM

## 2022-11-24 DIAGNOSIS — M1990 Unspecified osteoarthritis, unspecified site: Secondary | ICD-10-CM | POA: Insufficient documentation

## 2022-11-24 DIAGNOSIS — C50912 Malignant neoplasm of unspecified site of left female breast: Secondary | ICD-10-CM

## 2022-11-24 DIAGNOSIS — Z170 Estrogen receptor positive status [ER+]: Secondary | ICD-10-CM

## 2022-11-24 DIAGNOSIS — M19079 Primary osteoarthritis, unspecified ankle and foot: Secondary | ICD-10-CM | POA: Insufficient documentation

## 2022-11-24 NOTE — Progress Notes (Signed)
11/24/2022  HPI: Heather Mcintyre is a 56 y.o. female s/p left breast lumpectomy and SLNBx on 11/07/22 for left breast cancer.  Lymph nodes were negative and final pathology is T2N0.  ER/PR+, Her2-.  She has a follow up with Dr. Grayland Ormond and also a new appointment with Dr. Baruch Gouty on 11/29/22.  She's been doing well, and reports sometimes pulling sensation in the left breast, and reports the nipple is a bit more sensitive/tender.  Denies any troubles with the incisions or nipple drainage.  Vital signs: BP 139/85   Pulse 75   Temp 98.5 F (36.9 C) (Oral)   Ht _0  (1.727 m)   Wt 268 lb (121.6 kg)   SpO2 99%   BMI 40.75 kg/m    Physical Exam: Constitutional:  No acute distress Breast:  Left breast s/p lumpectomy of upper outer quadrant, with incision healing well.  No palpable masses, or other skin changes.  Nipple without any firmness or induration or drainage.  Left axillary incision healing well.  Assessment/Plan: This is a 56 y.o. female s/p left breast lumpectomy and SLNBx  --Discussed with patient that the pulling sensation may be related to scar tissue formation.  This may improve with time, though there may be some residual symptoms long-term.  Recommended that she wear a supportive bra to decrease the weight of the breast pulling on the scar tissue. --Oncotype currently pending.  Discussed with her that if chemotherapy is needed, we would see her again to discuss port placement. --Otherwise, she can follow up with me in about 6 months.   Melvyn Neth, [facility]

## 2022-11-24 NOTE — Patient Instructions (Addendum)
Please call the office with any questions or concerns. WE will contact you in 6 months to schedule a follow up appointment.

## 2022-11-29 ENCOUNTER — Encounter: Payer: Self-pay | Admitting: Radiation Oncology

## 2022-11-29 ENCOUNTER — Inpatient Hospital Stay (HOSPITAL_BASED_OUTPATIENT_CLINIC_OR_DEPARTMENT_OTHER): Payer: Medicare HMO | Admitting: Oncology

## 2022-11-29 ENCOUNTER — Encounter: Payer: Self-pay | Admitting: Oncology

## 2022-11-29 ENCOUNTER — Encounter: Payer: Self-pay | Admitting: *Deleted

## 2022-11-29 ENCOUNTER — Ambulatory Visit
Admission: RE | Admit: 2022-11-29 | Discharge: 2022-11-29 | Disposition: A | Discharge status: Home / Self Care | Payer: Medicare HMO | Source: Ambulatory Visit | Attending: Radiation Oncology | Admitting: Radiation Oncology

## 2022-11-29 VITALS — BP 132/71 | HR 64 | Temp 97.0°F | Resp 18

## 2022-11-29 DIAGNOSIS — C50912 Malignant neoplasm of unspecified site of left female breast: Secondary | ICD-10-CM | POA: Diagnosis not present

## 2022-11-29 DIAGNOSIS — I10 Essential (primary) hypertension: Secondary | ICD-10-CM | POA: Insufficient documentation

## 2022-11-29 DIAGNOSIS — Z881 Allergy status to other antibiotic agents status: Secondary | ICD-10-CM | POA: Insufficient documentation

## 2022-11-29 DIAGNOSIS — Z170 Estrogen receptor positive status [ER+]: Secondary | ICD-10-CM | POA: Diagnosis not present

## 2022-11-29 DIAGNOSIS — F32A Depression, unspecified: Secondary | ICD-10-CM | POA: Diagnosis not present

## 2022-11-29 DIAGNOSIS — F419 Anxiety disorder, unspecified: Secondary | ICD-10-CM | POA: Diagnosis not present

## 2022-11-29 DIAGNOSIS — Z79899 Other long term (current) drug therapy: Secondary | ICD-10-CM | POA: Insufficient documentation

## 2022-11-29 DIAGNOSIS — Z9049 Acquired absence of other specified parts of digestive tract: Secondary | ICD-10-CM | POA: Insufficient documentation

## 2022-11-29 DIAGNOSIS — Z8541 Personal history of malignant neoplasm of cervix uteri: Secondary | ICD-10-CM | POA: Insufficient documentation

## 2022-11-29 DIAGNOSIS — Z91041 Radiographic dye allergy status: Secondary | ICD-10-CM | POA: Insufficient documentation

## 2022-11-29 DIAGNOSIS — C50412 Malignant neoplasm of upper-outer quadrant of left female breast: Secondary | ICD-10-CM | POA: Insufficient documentation

## 2022-11-29 DIAGNOSIS — Z880 Allergy status to penicillin: Secondary | ICD-10-CM | POA: Insufficient documentation

## 2022-11-29 DIAGNOSIS — Z888 Allergy status to other drugs, medicaments and biological substances status: Secondary | ICD-10-CM | POA: Insufficient documentation

## 2022-11-29 DIAGNOSIS — Z510 Encounter for antineoplastic radiation therapy: Secondary | ICD-10-CM | POA: Insufficient documentation

## 2022-11-29 DIAGNOSIS — M1990 Unspecified osteoarthritis, unspecified site: Secondary | ICD-10-CM | POA: Diagnosis not present

## 2022-11-29 NOTE — Progress Notes (Signed)
Pt had lumpectomy. It went well. Has residual pain esp in nipple area. 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 Appetite is good. Energy is low.

## 2022-11-29 NOTE — Progress Notes (Signed)
Called exact sciences to inquire about Oncotype Dx results.   They are waiting on authorization from her insurance, the estimated result date is 12/05/22.

## 2022-11-29 NOTE — Progress Notes (Signed)
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  INTERVAL HISTORY: Patient returns to clinic today for further evaluation, discussion of her final pathology results, and treatment planning.  She underwent left lumpectomy on November 07, 2022 and tolerated the procedure well.  She currently feels well and is asymptomatic. She has no neurologic complaints.  She denies any recent fevers or illnesses.  She has a good appetite and denies weight loss.  She has no chest pain, shortness of breath, cough, or hemoptysis.  She denies any nausea, vomiting, constipation, or diarrhea.  She has no urinary complaints.  Patient offers no specific complaints today.  REVIEW OF SYSTEMS:   Review of Systems  Constitutional: Negative.  Negative for fever, malaise/fatigue and weight loss.  Respiratory: Negative.  Negative for cough, hemoptysis and shortness of breath.   Cardiovascular: Negative.  Negative for chest pain and leg swelling.  Gastrointestinal: Negative.  Negative for abdominal pain.  Genitourinary: Negative.  Negative for dysuria.  Musculoskeletal: Negative.  Negative for back pain.  Skin: Negative.  Negative for rash.  Neurological: Negative.  Negative for dizziness, focal weakness, weakness and headaches.  Psychiatric/Behavioral: Negative.  The patient is not nervous/anxious.     As per HPI. Otherwise, a complete review of systems is negative.  PAST MEDICAL HISTORY: Past Medical History:  Diagnosis Date   Anxiety    Asthma    Breast cancer ([place])    Cervical cancer ([place])    Complication of  anesthesia    woke up in the middle of surgery 2010   Degenerative joint disease/osteoarthritis    Depression    GERD (gastroesophageal reflux disease)    Hypertension    Lupus ([place])    Pre-diabetes     PAST SURGICAL HISTORY: Past Surgical History:  Procedure Laterality Date   ABDOMINAL HYSTERECTOMY     BREAST BIOPSY Left 10/04/2022   [place] LT BREAST BX W LOC DEV 1ST LESION IMG BX SPEC US GUIDE 10/04/2022 [facility]MAMMOGRAPHY   BREAST LUMPECTOMY,RADIO FREQ LOCALIZER,AXILLARY SENTINEL LYMPH NODE BIOPSY Left 11/07/2022   Procedure: BREAST LUMPECTOMY,RADIO FREQ LOCALIZER,AXILLARY SENTINEL LYMPH NODE BIOPSY;  Surgeon: Olean Ree, MD;  Location: [facility] ORS;  Service: General;  Laterality: Left;   CHOLECYSTECTOMY N/A    COLONOSCOPY WITH ESOPHAGOGASTRODUODENOSCOPY (EGD)     JOINT REPLACEMENT     LAPAROSCOPIC GASTRIC BAND REMOVAL WITH LAPAROSCOPIC GASTRIC SLEEVE RESECTION     REPLACEMENT TOTAL KNEE BILATERAL     TUBAL LIGATION      FAMILY HISTORY: Family History  Problem Relation Age of Onset   Breast cancer Neg Hx     ADVANCED DIRECTIVES (Y/N):  N  HEALTH MAINTENANCE: Social History   Tobacco Use   Smoking status: Never   Smokeless tobacco: Never  Substance Use Topics   Alcohol use: No   Drug use: Never     Colonoscopy:  PAP:  Bone density:  Lipid panel:  Allergies  Allergen Reactions   Cephalexin Hives and Other (See Comments)   Etodolac Hives   Iodinated Contrast Media Hives   Latex Hives and Other (See Comments)   Penicillin G Hives   Fish-Derived Products Other (See Comments)  Iodine Other (See Comments)   Penicillins    Lisinopril Other (See Comments)    Current Outpatient Medications  Medication Sig Dispense Refill   acetaminophen (TYLENOL) 500 MG tablet Take 500 mg by mouth every 6 (six) hours as needed.     acetaminophen (TYLENOL) 500 MG tablet Take 2 tablets (1,000 mg total) by mouth every 6 (six) hours as needed for mild pain.     ALOE VERA PO Take 1  tablet by mouth daily. Natural aloe     ascorbic acid (VITAMIN C) 500 MG tablet Take 500 mg by mouth daily.     B COMPLEX VITAMINS PO Take 1 tablet by mouth daily.     celecoxib (CELEBREX) 200 MG capsule Take 200 mg by mouth daily as needed.     celecoxib (CELEBREX) 200 MG capsule Take 1 capsule (200 mg total) by mouth 2 (two) times daily as needed for moderate pain. 40 capsule 1   cetirizine (ZYRTEC) 10 MG tablet Take 10 mg by mouth daily.     diphenhydrAMINE (BENADRYL) 25 mg capsule Take 25 mg by mouth every 6 (six) hours as needed.     fluticasone (FLONASE) 50 MCG/ACT nasal spray Place 1 spray into both nostrils daily.     furosemide (LASIX) 40 MG tablet Take 40 mg by mouth daily as needed.     gabapentin (NEURONTIN) 300 MG capsule Take 300 mg by mouth at bedtime.     hydrOXYzine (ATARAX) 10 MG tablet Take 10 mg by mouth 2 (two) times daily.     linaclotide (LINZESS) 290 MCG CAPS capsule Take 290 mcg by mouth daily before breakfast.     metoprolol succinate (TOPROL-XL) 50 MG 24 hr tablet Take 50 mg by mouth daily. Take with or immediately following a meal.     montelukast (SINGULAIR) 10 MG tablet Take 10 mg by mouth at bedtime.     multivitamin-lutein (OCUVITE-LUTEIN) CAPS capsule Take 1 capsule by mouth as needed.     omeprazole (PRILOSEC) 40 MG capsule      tiZANidine (ZANAFLEX) 4 MG tablet Take 4 mg by mouth every 8 (eight) hours as needed for muscle spasms.     traZODone (DESYREL) 100 MG tablet Take 100-300 mg by mouth at bedtime.     lidocaine-prilocaine (EMLA) cream Apply to the areola of the left breast and cover with plastic wrap one hour prior to leaving for surgery. 5 g 0   No current facility-administered medications for this visit.    OBJECTIVE: Vitals:   11/29/22 0948  BP: 132/71  Pulse: 64  Resp: 18  Temp: (!) 97 F (36.1 C)  SpO2: 99%     There is no height or weight on file to calculate BMI.    ECOG FS:0 - Asymptomatic  General: Well-developed, well-nourished, no  acute distress. Eyes: Pink conjunctiva, anicteric sclera. HEENT: Normocephalic, moist mucous membranes. Breast: Exam deferred today. Lungs: No audible wheezing or coughing. Heart: Regular rate and rhythm. Abdomen: Soft, nontender, no obvious distention. Musculoskeletal: No edema, cyanosis, or clubbing. Neuro: Alert, answering all questions appropriately. Cranial nerves grossly intact. Skin: No rashes or petechiae noted. Psych: Normal affect.   LAB RESULTS:  Lab Results  Component Value Date   NA 142 10/30/2022   K 3.5 10/30/2022   CL 107 10/30/2022   CO2 29 10/30/2022   GLUCOSE 82 10/30/2022   BUN 24 (H) 10/30/2022   CREATININE 0.80 10/30/2022   CALCIUM 9.4 10/30/2022   GFRNONAA >60 10/30/2022   GFRAA >60  02/10/2017    Lab Results  Component Value Date   WBC 5.7 10/30/2022   NEUTROABS 2.8 02/10/2017   HGB 12.8 10/30/2022   HCT 39.2 10/30/2022   MCV 88.5 10/30/2022   PLT 266 10/30/2022     STUDIES: MM Breast Surgical Specimen  Result Date: 11/07/2022 CLINICAL DATA:  Status post LEFT lumpectomy. EXAM: SPECIMEN RADIOGRAPH OF THE LEFT BREAST COMPARISON:  Previous exam(s). FINDINGS: Status post excision of the LEFT breast. The Magseed and heart biopsy marker clip are present, completely intact. IMPRESSION: Specimen radiograph of the LEFT breast. Electronically Signed   By: Valentino Saxon M.D.   On: 11/07/2022 11:56  NM Sentinel Node Inj-No Rpt (Breast)  Result Date: 11/07/2022 Sulfur Colloid was injected by the Nuclear Medicine Technologist for sentinel lymph node localization.   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 EXAM: MAMMOGRAPHIC GUIDED RADIOACTIVE SEED LOCALIZATION OF THE LEFT BREAST COMPARISON:  Previous exam(s). FINDINGS: Patient presents for Mag seed localization prior to  lumpectomy. I met with the patient and we discussed the procedure of Mag seed localization including benefits and alternatives. We discussed the high likelihood of a successful procedure. We discussed the risks of the procedure including infection, bleeding, tissue injury and further surgery. We discussed the low dose of radioactivity involved in the procedure. Informed, written consent was given. The usual time-out protocol was performed immediately prior to the procedure. Using mammographic guidance, sterile technique, 1% lidocaine and an I-125 radioactive seed, the heart shaped clip within the upper-outer quadrant of the LEFT breast was localized using a lateral approach. The follow-up mammogram images confirm the Mag seed in the expected location and were marked for Dr. Hampton Abbot. The patient tolerated the procedure well and was [facility]. IMPRESSION: Mag seed localization left breast.  No apparent complications. Electronically Signed   By: Franki Cabot M.D.   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  Oncotype score was sent and is pending at time of dictation, but suspect this will be low risk.  Patient had an appointment with radiation oncology today to discuss adjuvant XRT.  Given the ER/PR status of her tumor, she will benefit from letrozole for total 5 years after all of her treatments are complete.  Return to clinic the last week of her radiation treatment for further evaluation and initiation of letrozole. Bone health: Patient will require baseline bone mineral density upon initiating aromatase inhibitor.   Patient expressed understanding and was in agreement with this plan. She also understands that She can call clinic at any time with any  questions, concerns, or complaints.    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

## 2022-11-29 NOTE — Consult Note (Signed)
NEW PATIENT EVALUATION  Name: Heather Mcintyre  MRN: [id number]  Date:   11/29/2022     DOB: Dec 23, 1965   This 57 y.o. female patient presents to the clinic for initial evaluation of stage Ia (T2 N0 M0) invasive mammary carcinoma of the left breast status post wide local excision and sentinel node biopsy.  REFERRING PHYSICIAN: [facility] [place]: No chief complaint on file.   DIAGNOSIS: The encounter diagnosis was Malignant neoplasm of left breast in female, estrogen receptor positive, unspecified site of breast ([place]).   PREVIOUS INVESTIGATIONS:  Mammograms and ultrasound reviewed Clinical notes reviewed Pathology reports reviewed  HPI: Patient is a 57 year old female who presented with an abnormal mammogram of her left breast showing a suspicious area which was hypoechoic at the 1 o'clock position 14 cm from the nipple measuring 1.4 cm in greatest dimension.  Left axilla was evaluated by ultrasound showing no evidence of abnormal lymph nodes.  She underwent ultrasound-guided biopsy which was positive for invasive mammary carcinoma.  Tumor was overall grade 1.  She underwent a wide local excision for a 1.1 cm overall grade 1 invasive mammary carcinoma.  All margins were clear for invasive carcinoma by at least 1 cm.  DCIS was also involved and all margins were negative for DCIS with a margin of 1 cm.  4 sentinel lymph nodes were examined all negative for metastatic disease.  Again tumor was ER/PR positive HER2/neu not overexpressed.  They are running an Oncotype DX.  She is doing well postoperatively and specifically denies breast tenderness cough or bone pain.  She is extremely large volume is breasts.  She is now referred to radiation oncology for opinion.  PLANNED TREATMENT REGIMEN: Partial breast irradiation  PAST MEDICAL HISTORY:  has a past medical history of Anxiety, Asthma, Breast cancer ([place]), Cervical cancer ([place]), Complication of anesthesia, Degenerative  joint disease/osteoarthritis, Depression, GERD (gastroesophageal reflux disease), Hypertension, Lupus ([place]), and Pre-diabetes.    PAST SURGICAL HISTORY:  Past Surgical History:  Procedure Laterality Date   ABDOMINAL HYSTERECTOMY     BREAST BIOPSY Left 10/04/2022   [place] LT BREAST BX W LOC DEV 1ST LESION IMG BX SPEC US GUIDE 10/04/2022 [facility]   BREAST LUMPECTOMY,RADIO FREQ LOCALIZER,AXILLARY SENTINEL LYMPH NODE BIOPSY Left 11/07/2022   Procedure: BREAST LUMPECTOMY,RADIO FREQ LOCALIZER,AXILLARY SENTINEL LYMPH NODE BIOPSY;  Surgeon: Olean Ree, MD;  Location: [facility] OR[facility];  Service: General;  Laterality: Left;   CHOLECYSTECTOMY N/A    COLONOSCOPY WITH ESOPHAGOGASTRODUODENOSCOPY (EGD)     JOINT REPLACEMENT     LAPAROSCOPIC GASTRIC BAND REMOVAL WITH LAPAROSCOPIC GASTRIC SLEEVE RESECTION     REPLACEMENT TOTAL KNEE BILATERAL     TUBAL LIGATION      FAMILY HISTORY: family history is not on file.  SOCIAL HISTORY:  reports that she has never smoked. She has never used smokeless tobacco. She reports that she does not drink alcohol and does not use drugs.  ALLERGIES: Cephalexin, Etodolac, Iodinated contrast media, Latex, Penicillin g, Fish-derived products, Iodine, Penicillins, and Lisinopril  MEDICATIONS:  Current Outpatient Medications  Medication Sig Dispense Refill   acetaminophen (TYLENOL) 500 MG tablet Take 500 mg by mouth every 6 (six) hours as needed.     acetaminophen (TYLENOL) 500 MG tablet Take 2 tablets (1,000 mg total) by mouth every 6 (six) hours as needed for mild pain.     ALOE VERA PO Take 1 tablet by mouth daily. Natural aloe     ascorbic acid (VITAMIN C) 500 MG tablet Take  500 mg by mouth daily.     B COMPLEX VITAMINS PO Take 1 tablet by mouth daily.     celecoxib (CELEBREX) 200 MG capsule Take 200 mg by mouth daily as needed.     celecoxib (CELEBREX) 200 MG capsule Take 1 capsule (200 mg total) by mouth 2 (two) times daily as needed for moderate pain. 40 capsule 1    cetirizine (ZYRTEC) 10 MG tablet Take 10 mg by mouth daily.     diphenhydrAMINE (BENADRYL) 25 mg capsule Take 25 mg by mouth every 6 (six) hours as needed.     fluticasone (FLONASE) 50 MCG/ACT nasal spray Place 1 spray into both nostrils daily.     furosemide (LASIX) 40 MG tablet Take 40 mg by mouth daily as needed.     gabapentin (NEURONTIN) 300 MG capsule Take 300 mg by mouth at bedtime.     hydrOXYzine (ATARAX) 10 MG tablet Take 10 mg by mouth 2 (two) times daily.     lidocaine-prilocaine (EMLA) cream Apply to the areola of the left breast and cover with plastic wrap one hour prior to leaving for surgery. 5 g 0   linaclotide (LINZESS) 290 MCG CAPS capsule Take 290 mcg by mouth daily before breakfast.     metoprolol succinate (TOPROL-XL) 50 MG 24 hr tablet Take 50 mg by mouth daily. Take with or immediately following a meal.     montelukast (SINGULAIR) 10 MG tablet Take 10 mg by mouth at bedtime.     multivitamin-lutein (OCUVITE-LUTEIN) CAPS capsule Take 1 capsule by mouth as needed.     omeprazole (PRILOSEC) 40 MG capsule      tiZANidine (ZANAFLEX) 4 MG tablet Take 4 mg by mouth every 8 (eight) hours as needed for muscle spasms.     traZODone (DESYREL) 100 MG tablet Take 100-300 mg by mouth at bedtime.     No current facility-administered medications for this encounter.    ECOG PERFORMANCE STATUS:  0 - Asymptomatic  REVIEW OF SYSTEMS: Patient denies any weight loss, fatigue, weakness, fever, chills or night sweats. Patient denies any loss of vision, blurred vision. Patient denies any ringing  of the ears or hearing loss. No irregular heartbeat. Patient denies heart murmur or history of fainting. Patient denies any chest pain or pain radiating to her upper extremities. Patient denies any shortness of breath, difficulty breathing at night, cough or hemoptysis. Patient denies any swelling in the lower legs. Patient denies any nausea vomiting, vomiting of blood, or coffee ground material in the  vomitus. Patient denies any stomach pain. Patient states has had normal bowel movements no significant constipation or diarrhea. Patient denies any dysuria, hematuria or significant nocturia. Patient denies any problems walking, swelling in the joints or loss of balance. Patient denies any skin changes, loss of hair or loss of weight. Patient denies any excessive worrying or anxiety or significant depression. Patient denies any problems with insomnia. Patient denies excessive thirst, polyuria, polydipsia. Patient denies any swollen glands, patient denies easy bruising or easy bleeding. Patient denies any recent infections, allergies or URI. Patient "s visual fields have not changed significantly in recent time.   PHYSICAL EXAM: There were no vitals taken for this visit. Patient is status post wide local excision of the left breast with incision healing well.  No dominant masses noted in either breast.  No axillary or supraclavicular adenopathy is appreciated.  Breasts are large in volume is.  Well-developed well-nourished patient in NAD. HEENT reveals PERLA, EOMI, discs not visualized.  Oral cavity is clear. No oral mucosal lesions are identified. Neck is clear without evidence of cervical or supraclavicular adenopathy. Lungs are clear to A&P. Cardiac examination is essentially unremarkable with regular rate and rhythm without murmur rub or thrill. Abdomen is benign with no organomegaly or masses noted. Motor sensory and DTR levels are equal and symmetric in the upper and lower extremities. Cranial nerves II through XII are grossly intact. Proprioception is intact. No peripheral adenopathy or edema is identified. No motor or sensory levels are noted. Crude visual fields are within normal range.  LABORATORY DATA: Pathology reports reviewed    RADIOLOGY RESULTS: Mammograms and ultrasound reviewed compatible with above-stated findings   IMPRESSION: Stage Ia ER/PR positive invasive mammary carcinoma left  breast status post wide local excision in 57 year old female.  PLAN: At this time we will await Oncotype DX.  This will probably be low risk of recurrence and we will proceed to radiation therapy.  Based on the large nature of her breasts and the overall grade 1 histology I favor partial breast irradiation 35 Gray in 10 fractions.  Risks and benefits of treatment including skin reaction fatigue and some thickening of the breast tissue all were discussed in detail with the patient.  I have personally set up and ordered CT simulation in about a week week and a half time for allow for Oncotype DX to be reviewed.  Patient comprehends my treatment plan well.  She will benefit from endocrine therapy after completion of radiation.  I would like to take this opportunity to thank you for allowing me to participate in the care of your patient.Noreene Filbert, MD

## 2022-11-30 ENCOUNTER — Encounter: Payer: Self-pay | Admitting: Oncology

## 2022-11-30 LAB — SURGICAL PATHOLOGY

## 2022-12-07 ENCOUNTER — Ambulatory Visit
Admission: RE | Admit: 2022-12-07 | Discharge: 2022-12-07 | Disposition: A | Discharge status: Home / Self Care | Payer: Medicare HMO | Source: Ambulatory Visit | Attending: Radiation Oncology | Admitting: Radiation Oncology

## 2022-12-07 ENCOUNTER — Encounter: Payer: Self-pay | Admitting: *Deleted

## 2022-12-07 DIAGNOSIS — Z510 Encounter for antineoplastic radiation therapy: Secondary | ICD-10-CM | POA: Insufficient documentation

## 2022-12-07 DIAGNOSIS — Z170 Estrogen receptor positive status [ER+]: Secondary | ICD-10-CM | POA: Insufficient documentation

## 2022-12-07 DIAGNOSIS — C50412 Malignant neoplasm of upper-outer quadrant of left female breast: Secondary | ICD-10-CM | POA: Insufficient documentation

## 2022-12-08 ENCOUNTER — Other Ambulatory Visit: Payer: Self-pay | Admitting: *Deleted

## 2022-12-08 DIAGNOSIS — C50912 Malignant neoplasm of unspecified site of left female breast: Secondary | ICD-10-CM

## 2022-12-08 DIAGNOSIS — Z510 Encounter for antineoplastic radiation therapy: Secondary | ICD-10-CM | POA: Diagnosis not present

## 2022-12-12 NOTE — Progress Notes (Signed)
Primary Care Provider: [facility] [place] Cardiologist: Glenetta Hew, MD Electrophysiologist: None Oncologist: Dr. Delight Hoh  Clinic Note: Chief Complaint  Patient presents with   New Patient (Initial Visit)    Slow heart rate; occasional chest pain   ===================================  ASSESSMENT/PLAN   Problem List Items Addressed This Visit       Cardiology Problems   Essential hypertension (Chronic)    Blood pressure looks well-controlled today she is taking 50 mg Toprol which could explain her bradycardia.  She is also taking Lasix as needed maybe once or twice a week.      Relevant Orders   EKG 12-Lead     Other   Generalized edema    Not really associated PND or orthopnea.  Nothing does really suggest CHF.  She takes PRN Lasix once twice a week.      Bradycardia following surgery - Primary    She had an EKG that showed the heart rate of 49 bpm and apparently has had lower than usual heart rate.  She is on moderate dose Toprol which could be the reason.  Plan: 7-day ZIO patch monitor to determine heart rate range.      Relevant Orders   EKG 12-Lead   Morbid obesity with BMI of 40.0-44.9, adult (HCC) (Chronic)    I was wondering if maybe some of her fatigue issues could be related to OSA, the ceiling is more related to poor sleep because of pain and nocturia.  Certainly weight loss will help most of her musculoskeletal pain issues.  Discussed diet and exercise recommendations.      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  This is injection with and letrozole  Will discussed with Cardio Oncology-Dr. Phineas Inches.  Will determine whether or not she needs reevaluation with 2D echo.       ===================================  HPI:    Heather Mcintyre is an obese 57 y.o. female with PMH notable for HTN, DM-2, OA/DJD-knees  (SLE/RA), L Br CA & h/o Cerivical CA who is being seen today for the evaluation of OCCASIONAL SLOW HR WITH PROCEDURES, OCCASIONAL CHEST PAIN  at the request of [facility], [place]*. Dr. Lina Sar.   Heather Mcintyre was last seen on November 15, 2022 @ [facility] for routine f/u - shortly after Lumpectomy. Noted chronic issues with OA/RA MSK pain.  Hands and back.  Unfortunately, Heather Mcintyre was not covered by her insurance plan.  Started taking Victoza but was not effective.  Plan was referral to the weight loss center. => Trial of Neurontin for pain  Recent Hospitalizations:  11/07/2022: Left breast lumpectomy with sentinel node biopsy.  Reviewed  CV studies:    The following studies were reviewed today: (if available, images/films reviewed: From Epic Chart or Care Everywhere) None:  Interval History:   Heather Mcintyre presents here today really at her request because of having episodes of short-lived pressure-like sensations off and on in the chest.  These usually occur at rest, sometimes with sleeping.  Not exertional.  Not necessarily associate with other symptoms of dyspnea.  Can be associated with some irregular heartbeats.  Happen may be on occasion could go higher with exertion, not usually.  Oftentimes associated with stress.  The main symptoms that she notes is actually not a symptom, but a concern that she had is that she has  been told that her heart rates close low during procedures.  She did not really give me much more details on that, but states that that her heart rate is gone down to the 40s presumably during some of her recent procedures.  She is denies any lightheadedness dizziness associated with that nor has she realized any sensation of irregular heartbeats or slow heartbeats.  She denies any syncope or near syncope.  No TIA or amaurosis fugax.  No routine exercise related dyspnea or chest discomfort distal strength symptoms noted above.  She did  Nuys any PND, orthopnea with trivial end of day swelling.  What happens that she will wake up at night to go to the bathroom and would not be able to go back to sleep again.  She feels somewhat tired the next day. Her left leg has intermittent episodes of numbness and tingling that sometimes can also involve the left arm.  This has been going on for over a year now.  CV Review of Symptoms (Summary):  positive for - chest pain, irregular heartbeat, and intermittent low heart rates negative for - dyspnea on exertion, loss of consciousness, orthopnea, palpitations, paroxysmal nocturnal dyspnea, rapid heart rate, shortness of breath, or syncope/near syncope or TIA/RCVS, claudication  REVIEWED OF SYSTEMS   Review of Systems  Constitutional:  Positive for malaise/fatigue (Probably because she does not get good sleep-not that she does not feel rested.). Negative for weight loss (Apparently he Wegovy not covered by her insurance company.).  HENT:  Negative for congestion and nosebleeds.   Respiratory:  Positive for cough. Negative for shortness of breath.   Gastrointestinal:  Negative for blood in stool and melena.  Genitourinary:  Positive for frequency (Nocturia). Negative for hematuria.  Musculoskeletal:  Positive for myalgias. Negative for falls and joint pain.  Neurological:  Positive for tingling (Tingling and numbness of the left leg). Negative for dizziness and focal weakness.  Psychiatric/Behavioral:  Negative for depression. The patient is nervous/anxious. The patient does not have insomnia (Not really insomnia, of the nocturia and back pain/leg pain that keeps her up at night.).     I have reviewed and (if needed) personally updated the patient's problem list, medications, allergies, past medical and surgical history, social and family history.   PAST MEDICAL HISTORY   Past Medical History:  Diagnosis Date   Anxiety    Asthma    Breast cancer ([place])    Cervical cancer ([place])     Complication of anesthesia    woke up in the middle of surgery 2010   Degenerative joint disease/osteoarthritis    Depression    GERD (gastroesophageal reflux disease)    Hypertension    Lupus ([place])    Pre-diabetes    Invasive Ductal Carcinoma Left Breast November 2023 -> stage Ia ER/PR positive, HER2/neu negative L Breast Lumpectomy, Sentinel Node Bx 11/07/2022) Plan for adjuvant XRT & Letrozole x 5 yrs. Osteoarthritis-hand; polyarthralgia,  PAST SURGICAL HISTORY   Past Surgical History:  Procedure Laterality Date   ABDOMINAL HYSTERECTOMY     BREAST BIOPSY Left 10/04/2022   [place] LT BREAST BX W LOC DEV 1ST LESION IMG BX SPEC US GUIDE 10/04/2022 [facility]   BREAST LUMPECTOMY,RADIO FREQ LOCALIZER,AXILLARY SENTINEL LYMPH NODE BIOPSY Left 11/07/2022   Procedure: BREAST LUMPECTOMY,RADIO FREQ LOCALIZER,AXILLARY SENTINEL LYMPH NODE BIOPSY;  Surgeon: Olean Ree, MD;  Location: [facility];  Service: General;  Laterality: Left;   CHOLECYSTECTOMY N/A    COLONOSCOPY WITH ESOPHAGOGASTRODUODENOSCOPY (EGD)     JOINT REPLACEMENT  LAPAROSCOPIC GASTRIC BAND REMOVAL WITH LAPAROSCOPIC GASTRIC SLEEVE RESECTION     REPLACEMENT TOTAL KNEE BILATERAL     TUBAL LIGATION     BTL, TAH - 2021 (Cervical Ca),    There is no immunization history on file for this patient.  MEDICATIONS/ALLERGIES   Current Meds  Medication Sig   acetaminophen (TYLENOL) 500 MG tablet Take 500 mg by mouth every 6 (six) hours as needed.   acetaminophen (TYLENOL) 500 MG tablet Take 2 tablets (1,000 mg total) by mouth every 6 (six) hours as needed for mild pain.   ALOE VERA PO Take 1 tablet by mouth daily. Natural aloe   ascorbic acid (VITAMIN C) 500 MG tablet Take 500 mg by mouth daily.   B COMPLEX VITAMINS PO Take 1 tablet by mouth daily.   celecoxib (CELEBREX) 200 MG capsule Take 200 mg by mouth daily as needed.   celecoxib (CELEBREX) 200 MG capsule Take 1 capsule (200 mg total) by mouth 2 (two) times daily as needed  for moderate pain.   cetirizine (ZYRTEC) 10 MG tablet Take 10 mg by mouth daily.   diphenhydrAMINE (BENADRYL) 25 mg capsule Take 25 mg by mouth every 6 (six) hours as needed.   fluticasone (FLONASE) 50 MCG/ACT nasal spray Place 1 spray into both nostrils daily.   furosemide (LASIX) 40 MG tablet Take 40 mg by mouth daily as needed.   gabapentin (NEURONTIN) 300 MG capsule Take 300 mg by mouth at bedtime.   hydrOXYzine (ATARAX) 10 MG tablet Take 10 mg by mouth 2 (two) times daily.   linaclotide (LINZESS) 290 MCG CAPS capsule Take 290 mcg by mouth daily before breakfast.   metoprolol succinate (TOPROL-XL) 50 MG 24 hr tablet Take 50 mg by mouth daily. Take with or immediately following a meal.   montelukast (SINGULAIR) 10 MG tablet Take 10 mg by mouth at bedtime.   multivitamin-lutein (OCUVITE-LUTEIN) CAPS capsule Take 1 capsule by mouth as needed.   omeprazole (PRILOSEC) 40 MG capsule    tiZANidine (ZANAFLEX) 4 MG tablet Take 4 mg by mouth every 8 (eight) hours as needed for muscle spasms.   traZODone (DESYREL) 100 MG tablet Take 100-300 mg by mouth at bedtime.    Allergies  Allergen Reactions   Cephalexin Hives and Other (See Comments)   Etodolac Hives   Iodinated Contrast Media Hives   Latex Hives and Other (See Comments)   Penicillin G Hives   Fish-Derived Products Other (See Comments)   Iodine Other (See Comments)   Penicillins    Lisinopril Other (See Comments)    SOCIAL HISTORY/FAMILY HISTORY   Reviewed in Epic:   Social History   Tobacco Use   Smoking status: Never   Smokeless tobacco: Never  Substance Use Topics   Alcohol use: No   Drug use: Never   Social History   Social History Narrative   Divorced.  Lives alone.  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   Moved from [facility] to [place] to help her son and daughter-in-law with 3 grandchildren.   2 cups coffee a day.  No alcohol.   Non-smoker.  No exercise.    Family History  Problem Relation Age of Onset   Hypertension Mother    Hypertension Father    Breast cancer Neg Hx   Mother and father had hypertension  OBJCTIVE -PE, EKG, labs   Wt Readings from Last 3 Encounters:  12/13/22 263 lb  3.2 oz (119.4 kg)  11/24/22 268 lb (121.6 kg)  11/07/22 266 lb 14.4 oz (121.1 kg)    Physical Exam: BP 120/78   Pulse 70   Ht '5\' 8"'$  (1.727 m)   Wt 263 lb 3.2 oz (119.4 kg)   SpO2 98%   BMI 40.02 kg/m  Physical Exam Vitals reviewed.  Constitutional:      General: She is not in acute distress.    Appearance: Normal appearance. She is obese. She is not ill-appearing (Well-groomed.) or toxic-appearing.     Comments: Morbidly obese  HENT:     Head: Normocephalic and atraumatic.  Neck:     Vascular: No carotid bruit.  Cardiovascular:     Rate and Rhythm: Normal rate and regular rhythm.     Pulses: Normal pulses.     Heart sounds: No murmur heard.    No friction rub. No gallop.  Pulmonary:     Effort: Pulmonary effort is normal. No respiratory distress.     Comments: Distant breath sounds.  No W/R/R. Chest:     Chest wall: No tenderness.  Abdominal:     Comments: Obese but otherwise soft/NT/ND STEMI BS.  No HSM.  Musculoskeletal:        General: No swelling. Normal range of motion.     Cervical back: Normal range of motion and neck supple.  Skin:    General: Skin is warm and dry.  Neurological:     General: No focal deficit present.     Mental Status: She is alert and oriented to person, place, and time.     Gait: Gait normal.  Psychiatric:        Mood and Affect: Mood normal.        Behavior: Behavior normal.        Thought Content: Thought content normal.        Judgment: Judgment normal.     Adult ECG Report  Rate: 70;  Rhythm: normal sinus rhythm and nonspecific ST and T wave changes.  Otherwise normal axis, intervals durations. ;   Narrative Interpretation: Heart rate faster than from  December.  Recent Labs: Reviewed No results found for: "CHOL", "HDL", "LDLCALC", "LDLDIRECT", "TRIG", "CHOLHDL" Lab Results  Component Value Date   CREATININE 0.80 10/30/2022   BUN 24 (H) 10/30/2022   NA 142 10/30/2022   K 3.5 10/30/2022   CL 107 10/30/2022   CO2 29 10/30/2022      Latest Ref Rng & Units 10/30/2022    8:54 AM 02/10/2017   11:08 AM  CBC  WBC 4.0 - 10.5 K/uL 5.7  6.3   Hemoglobin 12.0 - 15.0 g/dL 12.8  13.2   Hematocrit 36.0 - 46.0 % 39.2  38.1   Platelets 150 - 400 K/uL 266  256     No results found for: "HGBA1C" No results found for: "TSH"  ================================================== I spent a total of 22 minutes with the patient spent in direct patient consultation.  Additional time spent with chart review  / charting (studies, outside notes, etc): 12 min precharting, 16 minutes charting-28 minutes total Total Time: 50 min  Current medicines are reviewed at length with the patient today.  (+/- concerns) N/A  Notice: This dictation was prepared with Dragon dictation along with smart phrase technology. Any transcriptional errors that result from this process are unintentional and may not be corrected upon review.   Studies Ordered:  Orders Placed This Encounter  Procedures   EKG 12-Lead   No orders of  the defined types were placed in this encounter.   Patient Instructions / Medication Changes & Studies & Tests Ordered   Patient Instructions  Medication Instructions:   No changes    Lab Work: Not needed   Testing/Procedures: Will be mailed to your home in 3 to 5 days Your physician has recommended that you wear a holter monitor 7 day ZIO. Holter monitors are medical devices that record the heart's electrical activity. Doctors most often use these monitors to diagnose arrhythmias. Arrhythmias are problems with the speed or rhythm of the heartbeat. The monitor is a small, portable device. You can wear one while you do your normal daily  activities. This is usually used to diagnose what is causing palpitations/syncope (passing out).   Follow-Up: At [facility], you and your health needs are our priority.  As part of our continuing mission to provide you with exceptional heart care, we have created designated Provider Care Teams.  These Care Teams include your primary Cardiologist (physician) and Advanced Practice Providers (APPs -  Physician Assistants and Nurse Practitioners) who all work together to provide you with the care you need, when you need it.     Your next appointment:   2 month(s)  The format for your next appointment:   In Person  Provider:   Glenetta Hew, MD   Frannie or  Glenetta Hew, MD - [facility]   Other Instructions   Dr Ellyn Hack will discuss with one of his partners to see if any other test are needed. We will contact you if it is needed  ZIO XT- Long Term Monitor Instructions  Your physician has requested you wear a ZIO patch monitor for 7 days.    Leonie Man, MD, MS Glenetta Hew, M.D., M.S. Interventional Cardiologist  [place]  Pager # [phone] Phone # [phone] [address] [place], [place] [address]   Thank you for choosing [facility]!!

## 2022-12-13 ENCOUNTER — Ambulatory Visit: Payer: Medicare HMO | Attending: Cardiology | Admitting: Cardiology

## 2022-12-13 ENCOUNTER — Encounter: Payer: Self-pay | Admitting: Cardiology

## 2022-12-13 VITALS — BP 120/78 | HR 70 | Ht 68.0 in | Wt 263.2 lb

## 2022-12-13 DIAGNOSIS — I9789 Other postprocedural complications and disorders of the circulatory system, not elsewhere classified: Secondary | ICD-10-CM | POA: Diagnosis not present

## 2022-12-13 DIAGNOSIS — I10 Essential (primary) hypertension: Secondary | ICD-10-CM | POA: Diagnosis not present

## 2022-12-13 DIAGNOSIS — R601 Generalized edema: Secondary | ICD-10-CM | POA: Diagnosis not present

## 2022-12-13 DIAGNOSIS — Z6841 Body Mass Index (BMI) 40.0 and over, adult: Secondary | ICD-10-CM

## 2022-12-13 DIAGNOSIS — C50912 Malignant neoplasm of unspecified site of left female breast: Secondary | ICD-10-CM | POA: Diagnosis not present

## 2022-12-13 NOTE — Assessment & Plan Note (Signed)
She had an EKG that showed the heart rate of 49 bpm and apparently has had lower than usual heart rate.  She is on moderate dose Toprol which could be the reason.  Plan: 7-day ZIO patch monitor to determine heart rate range.

## 2022-12-13 NOTE — Patient Instructions (Signed)
Medication Instructions:   No changes    Lab Work: Not needed   Testing/Procedures: Will be mailed to your home in 3 to 5 days Your physician has recommended that you wear a holter monitor 7 day ZIO. Holter monitors are medical devices that record the heart's electrical activity. Doctors most often use these monitors to diagnose arrhythmias. Arrhythmias are problems with the speed or rhythm of the heartbeat. The monitor is a small, portable device. You can wear one while you do your normal daily activities. This is usually used to diagnose what is causing palpitations/syncope (passing out).   Follow-Up: At [facility], you and your health needs are our priority.  As part of our continuing mission to provide you with exceptional heart care, we have created designated Provider Care Teams.  These Care Teams include your primary Cardiologist (physician) and Advanced Practice Providers (APPs -  Physician Assistants and Nurse Practitioners) who all work together to provide you with the care you need, when you need it.     Your next appointment:   2 month(s)  The format for your next appointment:   In Person  Provider:   Glenetta Hew, MD   [place] or  Glenetta Hew, MD - [facility]   Other Instructions   Dr Ellyn Hack will discuss with one of his partners to see if any other test are needed. We will contact you if it is needed  ZIO XT- Long Term Monitor Instructions  Your physician has requested you wear a ZIO patch monitor for 7 days.  This is a single patch monitor. [employer] supplies one patch monitor per enrollment. Additional stickers are not available. Please do not apply patch if you will be having a Nuclear Stress Test,  Echocardiogram, Cardiac CT, MRI, or Chest Xray during the period you would be wearing the  monitor. The patch cannot be worn during these tests. You cannot remove and re-apply the  ZIO XT patch monitor.  Your ZIO patch monitor will be mailed 3 day USPS to  your address on file. It may take 3-5 days  to receive your monitor after you have been enrolled.  Once you have received your monitor, please review the enclosed instructions. Your monitor  has already been registered assigning a specific monitor serial # to you.  Billing and Patient Assistance Program Information  We have supplied [employer] with any of your insurance information on file for billing purposes. [employer] offers a sliding scale Patient Assistance Program for patients that do not have  insurance, or whose insurance does not completely cover the cost of the ZIO monitor.  You must apply for the Patient Assistance Program to qualify for this discounted rate.  To apply, please call [employer] at [phone], select option 4, select option 2, ask to apply for  Patient Assistance Program. Theodore Demark will ask your household income, and how many people  are in your household. They will quote your out-of-pocket cost based on that information.  [employer] will also be able to set up a 37-month interest-free payment plan if needed.  Applying the monitor   Shave hair from upper left chest.  Hold abrader disc by orange tab. Rub abrader in 40 strokes over the upper left chest as  indicated in your monitor instructions.  Clean area with 4 enclosed alcohol pads. Let dry.  Apply patch as indicated in monitor instructions. Patch will be placed under collarbone on left  side of chest with arrow pointing upward.  Rub patch adhesive wings for 2  minutes. Remove white label marked "1". Remove the white  label marked "2". Rub patch adhesive wings for 2 additional minutes.  While looking in a mirror, press and release button in center of patch. A small green light will  flash 3-4 times. This will be your only indicator that the monitor has been turned on.  Do not shower for the first 24 hours. You may shower after the first 24 hours.  Press the button if you feel a symptom. You will hear a small click. Record  Date, Time and  Symptom in the Patient Logbook.  When you are ready to remove the patch, follow instructions on the last 2 pages of Patient  Logbook. Stick patch monitor onto the last page of Patient Logbook.  Place Patient Logbook in the blue and white box. Use locking tab on box and tape box closed  securely. The blue and white box has prepaid postage on it. Please place it in the mailbox as  soon as possible. Your physician should have your test results approximately 7 days after the  monitor has been mailed back to [facility].  Call L[facility]ita at [phone] if you have questions regarding  your ZIO XT patch monitor. Call them immediately if you see an orange light blinking on your  monitor.  If your monitor falls off in less than 4 days, contact our Monitor department at [phone].  If your monitor becomes loose or falls off after 4 days call [employer] at [phone] for  suggestions on securing your monitor

## 2022-12-13 NOTE — Assessment & Plan Note (Signed)
I was wondering if maybe some of her fatigue issues could be related to OSA, the ceiling is more related to poor sleep because of pain and nocturia.  Certainly weight loss will help most of her musculoskeletal pain issues.  Discussed diet and exercise recommendations.

## 2022-12-13 NOTE — Assessment & Plan Note (Signed)
Has had lumpectomy and is pending radiation therapy.  This is injection with and letrozole  Will discussed with Cardio Oncology-Dr. Phineas Inches.  Will determine whether or not she needs reevaluation with 2D echo.

## 2022-12-13 NOTE — Assessment & Plan Note (Signed)
Not really associated PND or orthopnea.  Nothing does really suggest CHF.  She takes PRN Lasix once twice a week.

## 2022-12-13 NOTE — Assessment & Plan Note (Signed)
Blood pressure looks well-controlled today she is taking 50 mg Toprol which could explain her bradycardia.  She is also taking Lasix as needed maybe once or twice a week.

## 2022-12-14 ENCOUNTER — Ambulatory Visit: Admission: RE | Admit: 2022-12-14 | Payer: Medicare HMO | Source: Ambulatory Visit

## 2022-12-18 ENCOUNTER — Other Ambulatory Visit: Payer: Self-pay

## 2022-12-18 ENCOUNTER — Ambulatory Visit
Admission: RE | Admit: 2022-12-18 | Discharge: 2022-12-18 | Disposition: A | Discharge status: Home / Self Care | Payer: Medicare HMO | Source: Ambulatory Visit | Attending: Radiation Oncology | Admitting: Radiation Oncology

## 2022-12-18 DIAGNOSIS — Z510 Encounter for antineoplastic radiation therapy: Secondary | ICD-10-CM | POA: Diagnosis not present

## 2022-12-18 LAB — RAD ONC ARIA SESSION SUMMARY
Course Elapsed Days: 0
Plan Fractions Treated to Date: 1
Plan Prescribed Dose Per Fraction: 3.5 Gy
Plan Total Fractions Prescribed: 10
Plan Total Prescribed Dose: 35 Gy
Reference Point Dosage Given to Date: 3.5 Gy
Reference Point Session Dosage Given: 3.5 Gy
Session Number: 1

## 2022-12-19 ENCOUNTER — Ambulatory Visit
Admission: RE | Admit: 2022-12-19 | Discharge: 2022-12-19 | Disposition: A | Discharge status: Home / Self Care | Payer: Medicare HMO | Source: Ambulatory Visit | Attending: Radiation Oncology | Admitting: Radiation Oncology

## 2022-12-19 ENCOUNTER — Other Ambulatory Visit: Payer: Self-pay

## 2022-12-19 DIAGNOSIS — Z510 Encounter for antineoplastic radiation therapy: Secondary | ICD-10-CM | POA: Diagnosis not present

## 2022-12-19 LAB — RAD ONC ARIA SESSION SUMMARY
Course Elapsed Days: 1
Plan Fractions Treated to Date: 2
Plan Prescribed Dose Per Fraction: 3.5 Gy
Plan Total Fractions Prescribed: 10
Plan Total Prescribed Dose: 35 Gy
Reference Point Dosage Given to Date: 7 Gy
Reference Point Session Dosage Given: 3.5 Gy
Session Number: 2

## 2022-12-20 ENCOUNTER — Ambulatory Visit
Admission: RE | Admit: 2022-12-20 | Discharge: 2022-12-20 | Disposition: A | Discharge status: Home / Self Care | Payer: Medicare HMO | Source: Ambulatory Visit | Attending: Radiation Oncology | Admitting: Radiation Oncology

## 2022-12-20 ENCOUNTER — Other Ambulatory Visit: Payer: Self-pay

## 2022-12-20 DIAGNOSIS — Z510 Encounter for antineoplastic radiation therapy: Secondary | ICD-10-CM | POA: Diagnosis not present

## 2022-12-20 LAB — RAD ONC ARIA SESSION SUMMARY
Course Elapsed Days: 2
Plan Fractions Treated to Date: 3
Plan Prescribed Dose Per Fraction: 3.5 Gy
Plan Total Fractions Prescribed: 10
Plan Total Prescribed Dose: 35 Gy
Reference Point Dosage Given to Date: 10.5 Gy
Reference Point Session Dosage Given: 3.5 Gy
Session Number: 3

## 2022-12-21 ENCOUNTER — Ambulatory Visit
Admission: RE | Admit: 2022-12-21 | Discharge: 2022-12-21 | Disposition: A | Discharge status: Home / Self Care | Payer: Medicare HMO | Source: Ambulatory Visit | Attending: Radiation Oncology | Admitting: Radiation Oncology

## 2022-12-21 ENCOUNTER — Inpatient Hospital Stay: Payer: Medicare HMO

## 2022-12-21 ENCOUNTER — Other Ambulatory Visit: Payer: Self-pay

## 2022-12-21 DIAGNOSIS — C50912 Malignant neoplasm of unspecified site of left female breast: Secondary | ICD-10-CM

## 2022-12-21 DIAGNOSIS — Z510 Encounter for antineoplastic radiation therapy: Secondary | ICD-10-CM | POA: Diagnosis not present

## 2022-12-21 LAB — CBC
HCT: 42.1 % (ref 36.0–46.0)
Hemoglobin: 13.3 g/dL (ref 12.0–15.0)
MCH: 29.2 pg (ref 26.0–34.0)
MCHC: 31.6 g/dL (ref 30.0–36.0)
MCV: 92.3 fL (ref 80.0–100.0)
Platelets: 291 10*3/uL (ref 150–400)
RBC: 4.56 MIL/uL (ref 3.87–5.11)
RDW: 14.3 % (ref 11.5–15.5)
WBC: 6.6 10*3/uL (ref 4.0–10.5)
nRBC: 0 % (ref 0.0–0.2)

## 2022-12-21 LAB — RAD ONC ARIA SESSION SUMMARY
Course Elapsed Days: 3
Plan Fractions Treated to Date: 4
Plan Prescribed Dose Per Fraction: 3.5 Gy
Plan Total Fractions Prescribed: 10
Plan Total Prescribed Dose: 35 Gy
Reference Point Dosage Given to Date: 14 Gy
Reference Point Session Dosage Given: 3.5 Gy
Session Number: 4

## 2022-12-22 ENCOUNTER — Other Ambulatory Visit: Payer: Self-pay

## 2022-12-22 ENCOUNTER — Ambulatory Visit
Admission: RE | Admit: 2022-12-22 | Discharge: 2022-12-22 | Disposition: A | Discharge status: Home / Self Care | Payer: Medicare HMO | Source: Ambulatory Visit | Attending: Radiation Oncology | Admitting: Radiation Oncology

## 2022-12-22 DIAGNOSIS — Z510 Encounter for antineoplastic radiation therapy: Secondary | ICD-10-CM | POA: Diagnosis not present

## 2022-12-22 LAB — RAD ONC ARIA SESSION SUMMARY
Course Elapsed Days: 4
Plan Fractions Treated to Date: 5
Plan Prescribed Dose Per Fraction: 3.5 Gy
Plan Total Fractions Prescribed: 10
Plan Total Prescribed Dose: 35 Gy
Reference Point Dosage Given to Date: 17.5 Gy
Reference Point Session Dosage Given: 3.5 Gy
Session Number: 5

## 2022-12-25 ENCOUNTER — Ambulatory Visit
Admission: RE | Admit: 2022-12-25 | Discharge: 2022-12-25 | Disposition: A | Discharge status: Home / Self Care | Payer: Medicare HMO | Source: Ambulatory Visit | Attending: Radiation Oncology | Admitting: Radiation Oncology

## 2022-12-25 ENCOUNTER — Other Ambulatory Visit: Payer: Self-pay

## 2022-12-25 DIAGNOSIS — Z510 Encounter for antineoplastic radiation therapy: Secondary | ICD-10-CM | POA: Diagnosis not present

## 2022-12-25 LAB — RAD ONC ARIA SESSION SUMMARY
Course Elapsed Days: 7
Plan Fractions Treated to Date: 6
Plan Prescribed Dose Per Fraction: 3.5 Gy
Plan Total Fractions Prescribed: 10
Plan Total Prescribed Dose: 35 Gy
Reference Point Dosage Given to Date: 21 Gy
Reference Point Session Dosage Given: 3.5 Gy
Session Number: 6

## 2022-12-26 ENCOUNTER — Other Ambulatory Visit: Payer: Self-pay

## 2022-12-26 ENCOUNTER — Ambulatory Visit
Admission: RE | Admit: 2022-12-26 | Discharge: 2022-12-26 | Disposition: A | Discharge status: Home / Self Care | Payer: Medicare HMO | Source: Ambulatory Visit | Attending: Radiation Oncology | Admitting: Radiation Oncology

## 2022-12-26 DIAGNOSIS — Z510 Encounter for antineoplastic radiation therapy: Secondary | ICD-10-CM | POA: Diagnosis not present

## 2022-12-26 LAB — RAD ONC ARIA SESSION SUMMARY
Course Elapsed Days: 8
Plan Fractions Treated to Date: 7
Plan Prescribed Dose Per Fraction: 3.5 Gy
Plan Total Fractions Prescribed: 10
Plan Total Prescribed Dose: 35 Gy
Reference Point Dosage Given to Date: 24.5 Gy
Reference Point Session Dosage Given: 3.5 Gy
Session Number: 7

## 2022-12-27 ENCOUNTER — Ambulatory Visit: Payer: Medicare HMO

## 2022-12-28 ENCOUNTER — Ambulatory Visit
Admission: RE | Admit: 2022-12-28 | Discharge: 2022-12-28 | Disposition: A | Discharge status: Home / Self Care | Payer: Medicare HMO | Source: Ambulatory Visit | Attending: Radiation Oncology | Admitting: Radiation Oncology

## 2022-12-28 ENCOUNTER — Other Ambulatory Visit: Payer: Self-pay

## 2022-12-28 DIAGNOSIS — Z79899 Other long term (current) drug therapy: Secondary | ICD-10-CM | POA: Insufficient documentation

## 2022-12-28 DIAGNOSIS — F419 Anxiety disorder, unspecified: Secondary | ICD-10-CM | POA: Insufficient documentation

## 2022-12-28 DIAGNOSIS — Z170 Estrogen receptor positive status [ER+]: Secondary | ICD-10-CM | POA: Insufficient documentation

## 2022-12-28 DIAGNOSIS — Z9049 Acquired absence of other specified parts of digestive tract: Secondary | ICD-10-CM | POA: Diagnosis not present

## 2022-12-28 DIAGNOSIS — M1990 Unspecified osteoarthritis, unspecified site: Secondary | ICD-10-CM | POA: Insufficient documentation

## 2022-12-28 DIAGNOSIS — C50412 Malignant neoplasm of upper-outer quadrant of left female breast: Secondary | ICD-10-CM | POA: Insufficient documentation

## 2022-12-28 DIAGNOSIS — Z510 Encounter for antineoplastic radiation therapy: Secondary | ICD-10-CM | POA: Diagnosis present

## 2022-12-28 DIAGNOSIS — F32A Depression, unspecified: Secondary | ICD-10-CM | POA: Insufficient documentation

## 2022-12-28 DIAGNOSIS — I10 Essential (primary) hypertension: Secondary | ICD-10-CM | POA: Diagnosis not present

## 2022-12-28 DIAGNOSIS — Z8541 Personal history of malignant neoplasm of cervix uteri: Secondary | ICD-10-CM | POA: Diagnosis not present

## 2022-12-28 LAB — RAD ONC ARIA SESSION SUMMARY
Course Elapsed Days: 10
Plan Fractions Treated to Date: 8
Plan Prescribed Dose Per Fraction: 3.5 Gy
Plan Total Fractions Prescribed: 10
Plan Total Prescribed Dose: 35 Gy
Reference Point Dosage Given to Date: 28 Gy
Reference Point Session Dosage Given: 3.5 Gy
Session Number: 8

## 2022-12-29 ENCOUNTER — Encounter: Payer: Self-pay | Admitting: Oncology

## 2022-12-29 ENCOUNTER — Inpatient Hospital Stay (HOSPITAL_BASED_OUTPATIENT_CLINIC_OR_DEPARTMENT_OTHER): Payer: Medicare HMO | Admitting: Oncology

## 2022-12-29 ENCOUNTER — Ambulatory Visit
Admission: RE | Admit: 2022-12-29 | Discharge: 2022-12-29 | Disposition: A | Discharge status: Home / Self Care | Payer: Medicare HMO | Source: Ambulatory Visit | Attending: Radiation Oncology | Admitting: Radiation Oncology

## 2022-12-29 ENCOUNTER — Other Ambulatory Visit: Payer: Self-pay

## 2022-12-29 VITALS — BP 148/77 | HR 59 | Temp 95.9°F | Resp 18 | Ht 68.0 in | Wt 260.0 lb

## 2022-12-29 DIAGNOSIS — Z90710 Acquired absence of both cervix and uterus: Secondary | ICD-10-CM | POA: Insufficient documentation

## 2022-12-29 DIAGNOSIS — Z8249 Family history of ischemic heart disease and other diseases of the circulatory system: Secondary | ICD-10-CM | POA: Insufficient documentation

## 2022-12-29 DIAGNOSIS — Z170 Estrogen receptor positive status [ER+]: Secondary | ICD-10-CM | POA: Insufficient documentation

## 2022-12-29 DIAGNOSIS — Z881 Allergy status to other antibiotic agents status: Secondary | ICD-10-CM | POA: Insufficient documentation

## 2022-12-29 DIAGNOSIS — Z8541 Personal history of malignant neoplasm of cervix uteri: Secondary | ICD-10-CM | POA: Insufficient documentation

## 2022-12-29 DIAGNOSIS — Z79899 Other long term (current) drug therapy: Secondary | ICD-10-CM | POA: Insufficient documentation

## 2022-12-29 DIAGNOSIS — Z880 Allergy status to penicillin: Secondary | ICD-10-CM | POA: Insufficient documentation

## 2022-12-29 DIAGNOSIS — Z888 Allergy status to other drugs, medicaments and biological substances status: Secondary | ICD-10-CM | POA: Insufficient documentation

## 2022-12-29 DIAGNOSIS — Z510 Encounter for antineoplastic radiation therapy: Secondary | ICD-10-CM | POA: Diagnosis not present

## 2022-12-29 DIAGNOSIS — I10 Essential (primary) hypertension: Secondary | ICD-10-CM | POA: Insufficient documentation

## 2022-12-29 DIAGNOSIS — C50912 Malignant neoplasm of unspecified site of left female breast: Secondary | ICD-10-CM

## 2022-12-29 DIAGNOSIS — Z9049 Acquired absence of other specified parts of digestive tract: Secondary | ICD-10-CM | POA: Insufficient documentation

## 2022-12-29 DIAGNOSIS — Z79811 Long term (current) use of aromatase inhibitors: Secondary | ICD-10-CM | POA: Insufficient documentation

## 2022-12-29 DIAGNOSIS — Z91041 Radiographic dye allergy status: Secondary | ICD-10-CM | POA: Insufficient documentation

## 2022-12-29 LAB — RAD ONC ARIA SESSION SUMMARY
Course Elapsed Days: 11
Plan Fractions Treated to Date: 9
Plan Prescribed Dose Per Fraction: 3.5 Gy
Plan Total Fractions Prescribed: 10
Plan Total Prescribed Dose: 35 Gy
Reference Point Dosage Given to Date: 31.5 Gy
Reference Point Session Dosage Given: 3.5 Gy
Session Number: 9

## 2022-12-29 MED ORDER — LETROZOLE 2.5 MG PO TABS: 2.5000 mg | ORAL_TABLET | Freq: Every day | ORAL | 3 refills | Status: DC

## 2022-12-29 NOTE — Progress Notes (Signed)
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  Oncotype Dx score 0.  INTERVAL HISTORY: Patient returns to clinic today near the end of her adjuvant XRT for further evaluation and initiation of letrozole.  She is tolerating her treatments well without significant side effects.  She currently feels well and is asymptomatic. She has no neurologic complaints.  She denies any recent fevers or illnesses.  She has a good appetite and denies weight loss.  She has no chest pain, shortness of breath, cough, or hemoptysis.  She denies any nausea, vomiting, constipation, or diarrhea.  She has no urinary complaints.  Patient offers no specific complaints today.  REVIEW OF SYSTEMS:   Review of Systems  Constitutional: Negative.  Negative for fever, malaise/fatigue and weight loss.  Respiratory: Negative.  Negative for cough, hemoptysis and shortness of breath.   Cardiovascular: Negative.  Negative for chest pain and leg swelling.  Gastrointestinal: Negative.  Negative for abdominal pain.  Genitourinary: Negative.  Negative for dysuria.  Musculoskeletal: Negative.  Negative for back pain.  Skin: Negative.  Negative for rash.  Neurological: Negative.  Negative for dizziness, focal weakness, weakness and headaches.  Psychiatric/Behavioral: Negative.  The patient is not nervous/anxious.     As per HPI. Otherwise, a complete review of systems is negative.  PAST MEDICAL HISTORY: Past Medical History:  Diagnosis Date   Anxiety    Asthma    Breast  cancer ([place])    Cervical cancer ([place])    Complication of anesthesia    woke up in the middle of surgery 2010   Degenerative joint disease/osteoarthritis    Depression    GERD (gastroesophageal reflux disease)    Hypertension    Lupus ([place])    Pre-diabetes     PAST SURGICAL HISTORY: Past Surgical History:  Procedure Laterality Date   ABDOMINAL HYSTERECTOMY     BREAST BIOPSY Left 10/04/2022   [place] LT BREAST BX W LOC DEV 1ST LESION IMG BX SPEC US GUIDE 10/04/2022 [facility]   BREAST LUMPECTOMY,RADIO FREQ LOCALIZER,AXILLARY SENTINEL LYMPH NODE BIOPSY Left 11/07/2022   Procedure: BREAST LUMPECTOMY,RADIO FREQ LOCALIZER,AXILLARY SENTINEL LYMPH NODE BIOPSY;  Surgeon: Olean Ree, MD;  Location: [facility] ORS;  Service: General;  Laterality: Left;   CHOLECYSTECTOMY N/A    COLONOSCOPY WITH ESOPHAGOGASTRODUODENOSCOPY (EGD)     JOINT REPLACEMENT     LAPAROSCOPIC GASTRIC BAND REMOVAL WITH LAPAROSCOPIC GASTRIC SLEEVE RESECTION     REPLACEMENT TOTAL KNEE BILATERAL     TUBAL LIGATION      FAMILY HISTORY: Family History  Problem Relation Age of Onset   Hypertension Mother    Hypertension Father    Breast cancer Neg Hx     ADVANCED DIRECTIVES (Y/N):  N  HEALTH MAINTENANCE: Social History   Tobacco Use   Smoking status: Never   Smokeless tobacco: Never  Vaping Use   Vaping Use: Never used  Substance Use Topics   Alcohol use: No   Drug use: Never     Colonoscopy:  PAP:  Bone density:  Lipid panel:  Allergies  Allergen Reactions   Cephalexin Hives and Other (See Comments)  Etodolac Hives   Iodinated Contrast Media Hives   Latex Hives and Other (See Comments)   Penicillin G Hives   Fish-Derived Products Other (See Comments)   Iodine Other (See Comments)   Penicillins    Lisinopril Other (See Comments)    Current Outpatient Medications  Medication Sig Dispense Refill   acetaminophen (TYLENOL) 500 MG tablet Take 2 tablets (1,000 mg total) by mouth every 6 (six) hours  as needed for mild pain.     ALOE VERA PO Take 1 tablet by mouth daily. Natural aloe     ascorbic acid (VITAMIN C) 500 MG tablet Take 500 mg by mouth daily.     B COMPLEX VITAMINS PO Take 1 tablet by mouth daily.     celecoxib (CELEBREX) 200 MG capsule Take 200 mg by mouth daily as needed.     celecoxib (CELEBREX) 200 MG capsule Take 1 capsule (200 mg total) by mouth 2 (two) times daily as needed for moderate pain. 40 capsule 1   cetirizine (ZYRTEC) 10 MG tablet Take 10 mg by mouth daily.     diphenhydrAMINE (BENADRYL) 25 mg capsule Take 25 mg by mouth every 6 (six) hours as needed.     fluticasone (FLONASE) 50 MCG/ACT nasal spray Place 1 spray into both nostrils daily.     furosemide (LASIX) 40 MG tablet Take 40 mg by mouth daily as needed.     gabapentin (NEURONTIN) 300 MG capsule Take 300 mg by mouth at bedtime.     hydrOXYzine (ATARAX) 10 MG tablet Take 10 mg by mouth 2 (two) times daily.     letrozole (FEMARA) 2.5 MG tablet Take 1 tablet (2.5 mg total) by mouth daily. 30 tablet 3   linaclotide (LINZESS) 290 MCG CAPS capsule Take 290 mcg by mouth daily before breakfast.     metoprolol succinate (TOPROL-XL) 50 MG 24 hr tablet Take 50 mg by mouth daily. Take with or immediately following a meal.     montelukast (SINGULAIR) 10 MG tablet Take 10 mg by mouth at bedtime.     multivitamin-lutein (OCUVITE-LUTEIN) CAPS capsule Take 1 capsule by mouth as needed.     omeprazole (PRILOSEC) 40 MG capsule      tiZANidine (ZANAFLEX) 4 MG tablet Take 4 mg by mouth every 8 (eight) hours as needed for muscle spasms.     traZODone (DESYREL) 100 MG tablet Take 100-300 mg by mouth at bedtime.     acetaminophen (TYLENOL) 500 MG tablet Take 500 mg by mouth every 6 (six) hours as needed. (Patient not taking: Reported on 12/29/2022)     No current facility-administered medications for this visit.    OBJECTIVE: Vitals:   12/29/22 1101  BP: (!) 148/77  Pulse: (!) 59  Resp: 18  Temp: (!) 95.9 F (35.5 C)   SpO2: 99%     Body mass index is 39.53 kg/m.    ECOG FS:0 - Asymptomatic  General: Well-developed, well-nourished, no acute distress. Eyes: Pink conjunctiva, anicteric sclera. HEENT: Normocephalic, moist mucous membranes. Lungs: No audible wheezing or coughing. Heart: Regular rate and rhythm. Abdomen: Soft, nontender, no obvious distention. Musculoskeletal: No edema, cyanosis, or clubbing. Neuro: Alert, answering all questions appropriately. Cranial nerves grossly intact. Skin: No rashes or petechiae noted. Psych: Normal affect.  LAB RESULTS:  Lab Results  Component Value Date   NA 142 10/30/2022   K 3.5 10/30/2022   CL 107 10/30/2022   CO2 29 10/30/2022   GLUCOSE 82 10/30/2022   BUN 24 (H) 10/30/2022  CREATININE 0.80 10/30/2022   CALCIUM 9.4 10/30/2022   GFRNONAA >60 10/30/2022   GFRAA >60 02/10/2017    Lab Results  Component Value Date   WBC 6.6 12/21/2022   NEUTROABS 2.8 02/10/2017   HGB 13.3 12/21/2022   HCT 42.1 12/21/2022   MCV 92.3 12/21/2022   PLT 291 12/21/2022     STUDIES: No results found.  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  Oncotype Dx score 0.  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  Oncotype Dx score was 0 indicating low risk and no benefit of adjuvant chemotherapy.  Patient will complete adjuvant XRT on January 01, 2023.  Given the ER/PR status of her tumor, she will benefit from letrozole for total 5 years and was given a prescription today.  Return to clinic in 3 months for routine evaluation.   Bone health: Patient will require baseline bone mineral density in the next 1 to 2 weeks.  I spent a total of 30 minutes reviewing chart data, face-to-face evaluation with the patient, counseling and coordination of care as detailed above.   Patient expressed understanding and was  in agreement with this plan. She also understands that She can call clinic at any time with any questions, concerns, or complaints.    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

## 2023-01-01 ENCOUNTER — Ambulatory Visit
Admission: RE | Admit: 2023-01-01 | Discharge: 2023-01-01 | Disposition: A | Discharge status: Home / Self Care | Payer: Medicare HMO | Source: Ambulatory Visit | Attending: Radiation Oncology | Admitting: Radiation Oncology

## 2023-01-01 ENCOUNTER — Other Ambulatory Visit: Payer: Self-pay

## 2023-01-01 DIAGNOSIS — Z510 Encounter for antineoplastic radiation therapy: Secondary | ICD-10-CM | POA: Diagnosis not present

## 2023-01-01 LAB — RAD ONC ARIA SESSION SUMMARY
Course Elapsed Days: 14
Plan Fractions Treated to Date: 10
Plan Prescribed Dose Per Fraction: 3.5 Gy
Plan Total Fractions Prescribed: 10
Plan Total Prescribed Dose: 35 Gy
Reference Point Dosage Given to Date: 35 Gy
Reference Point Session Dosage Given: 3.5 Gy
Session Number: 10

## 2023-01-03 ENCOUNTER — Encounter: Payer: Self-pay | Admitting: *Deleted

## 2023-01-11 ENCOUNTER — Encounter: Payer: Self-pay | Admitting: Emergency Medicine

## 2023-01-11 ENCOUNTER — Emergency Department: Payer: Medicare HMO

## 2023-01-11 ENCOUNTER — Emergency Department
Admission: EM | Admit: 2023-01-11 | Discharge: 2023-01-11 | Disposition: A | Discharge status: Home / Self Care | Payer: Medicare HMO | Attending: Emergency Medicine | Admitting: Emergency Medicine

## 2023-01-11 DIAGNOSIS — Z853 Personal history of malignant neoplasm of breast: Secondary | ICD-10-CM | POA: Insufficient documentation

## 2023-01-11 DIAGNOSIS — Z79899 Other long term (current) drug therapy: Secondary | ICD-10-CM | POA: Insufficient documentation

## 2023-01-11 DIAGNOSIS — J45909 Unspecified asthma, uncomplicated: Secondary | ICD-10-CM | POA: Insufficient documentation

## 2023-01-11 DIAGNOSIS — M10072 Idiopathic gout, left ankle and foot: Secondary | ICD-10-CM | POA: Insufficient documentation

## 2023-01-11 DIAGNOSIS — I10 Essential (primary) hypertension: Secondary | ICD-10-CM | POA: Insufficient documentation

## 2023-01-11 DIAGNOSIS — M79672 Pain in left foot: Secondary | ICD-10-CM | POA: Diagnosis present

## 2023-01-11 LAB — CBC WITH DIFFERENTIAL/PLATELET
Abs Immature Granulocytes: 0.02 10*3/uL (ref 0.00–0.07)
Basophils Absolute: 0.1 10*3/uL (ref 0.0–0.1)
Basophils Relative: 1 %
Eosinophils Absolute: 0.3 10*3/uL (ref 0.0–0.5)
Eosinophils Relative: 4 %
HCT: 37.1 % (ref 36.0–46.0)
Hemoglobin: 12.1 g/dL (ref 12.0–15.0)
Immature Granulocytes: 0 %
Lymphocytes Relative: 37 %
Lymphs Abs: 2.6 10*3/uL (ref 0.7–4.0)
MCH: 28.9 pg (ref 26.0–34.0)
MCHC: 32.6 g/dL (ref 30.0–36.0)
MCV: 88.5 fL (ref 80.0–100.0)
Monocytes Absolute: 0.6 10*3/uL (ref 0.1–1.0)
Monocytes Relative: 9 %
Neutro Abs: 3.4 10*3/uL (ref 1.7–7.7)
Neutrophils Relative %: 49 %
Platelets: 261 10*3/uL (ref 150–400)
RBC: 4.19 MIL/uL (ref 3.87–5.11)
RDW: 14 % (ref 11.5–15.5)
WBC: 7 10*3/uL (ref 4.0–10.5)
nRBC: 0 % (ref 0.0–0.2)

## 2023-01-11 LAB — BASIC METABOLIC PANEL
Anion gap: 10 (ref 5–15)
BUN: 16 mg/dL (ref 6–20)
CO2: 26 mmol/L (ref 22–32)
Calcium: 9.1 mg/dL (ref 8.9–10.3)
Chloride: 100 mmol/L (ref 98–111)
Creatinine, Ser: 0.79 mg/dL (ref 0.44–1.00)
GFR, Estimated: >60 mL/min (ref >60)
Glucose, Bld: 105 mg/dL — ABNORMAL HIGH (ref 70–99)
Potassium: 3.5 mmol/L (ref 3.5–5.1)
Sodium: 136 mmol/L (ref 135–145)

## 2023-01-11 LAB — TROPONIN I (HIGH SENSITIVITY): Troponin I (High Sensitivity): 20 ng/L — ABNORMAL HIGH (ref <18)

## 2023-01-11 MED ORDER — COLCHICINE 0.6 MG PO TABS
1.2000 mg | ORAL_TABLET | Freq: Once | ORAL | Status: AC
  Administered 2023-01-11: 1.2 mg via ORAL
  Filled 2023-01-11: qty 2

## 2023-01-11 MED ORDER — ONDANSETRON 4 MG PO TBDP: 4.0000 mg | ORAL_TABLET | Freq: Four times a day (QID) | ORAL | 0 refills | Status: DC | PRN

## 2023-01-11 MED ORDER — INDOMETHACIN 50 MG PO CAPS: 50.0000 mg | ORAL_CAPSULE | Freq: Three times a day (TID) | ORAL | 0 refills | Status: DC

## 2023-01-11 MED ORDER — COLCHICINE 0.6 MG PO TABS: 0.6000 mg | ORAL_TABLET | Freq: Every day | ORAL | 0 refills | Status: DC

## 2023-01-11 MED ORDER — INDOMETHACIN 50 MG PO CAPS
50.0000 mg | ORAL_CAPSULE | Freq: Once | ORAL | Status: AC
  Administered 2023-01-11: 50 mg via ORAL
  Filled 2023-01-11: qty 1

## 2023-01-11 MED ORDER — OXYCODONE-ACETAMINOPHEN 5-325 MG PO TABS: 2.0000 | ORAL_TABLET | Freq: Four times a day (QID) | ORAL | 0 refills | Status: DC | PRN

## 2023-01-11 NOTE — ED Provider Notes (Signed)
[facility] Provider Note    Event Date/Time   First MD Initiated Contact with Patient 01/11/23 0245     (approximate)   History   Foot Pain   HPI  Heather Mcintyre is a 57 y.o. female with history of breast cancer, hypertension, lupus who presents to the emergency department with left great toe pain, redness and warmth.  States this feels like gout that she has had previously.  Denies any injury.  She is able to ambulate.  Denies calf tenderness or calf swelling.  No chest pain or shortness of breath.  Denies any fever.   History provided by patient.    Past Medical History:  Diagnosis Date   Anxiety    Asthma    Breast cancer ([place])    Cervical cancer ([place])    Complication of anesthesia    woke up in the middle of surgery 2010   Degenerative joint disease/osteoarthritis    Depression    GERD (gastroesophageal reflux disease)    Hypertension    Lupus ([place])    Pre-diabetes     Past Surgical History:  Procedure Laterality Date   ABDOMINAL HYSTERECTOMY     BREAST BIOPSY Left 10/04/2022   [place] LT BREAST BX W LOC DEV 1ST LESION IMG BX SPEC US GUIDE 10/04/2022 [facility]   BREAST LUMPECTOMY,RADIO FREQ LOCALIZER,AXILLARY SENTINEL LYMPH NODE BIOPSY Left 11/07/2022   Procedure: BREAST LUMPECTOMY,RADIO FREQ LOCALIZER,AXILLARY SENTINEL LYMPH NODE BIOPSY;  Surgeon: Olean Ree, MD;  Location: [facility] ORS;  Service: General;  Laterality: Left;   CHOLECYSTECTOMY N/A    COLONOSCOPY WITH ESOPHAGOGASTRODUODENOSCOPY (EGD)     JOINT REPLACEMENT     LAPAROSCOPIC GASTRIC BAND REMOVAL WITH LAPAROSCOPIC GASTRIC SLEEVE RESECTION     REPLACEMENT TOTAL KNEE BILATERAL     TUBAL LIGATION      MEDICATIONS:  Prior to Admission medications   Medication Sig Start Date End Date Taking? Authorizing Provider  colchicine 0.6 MG tablet Take 1 tablet (0.6 mg total) by mouth daily. 01/11/23 01/11/24 Yes Latifah Padin, Delice Bison, DO  indomethacin (INDOCIN) 50 MG capsule Take 1  capsule (50 mg total) by mouth 3 (three) times daily with meals. 01/11/23  Yes Arisbel Maione, Cyril Mourning N, DO  ondansetron (ZOFRAN-ODT) 4 MG disintegrating tablet Take 1 tablet (4 mg total) by mouth every 6 (six) hours as needed for nausea or vomiting. 01/11/23  Yes Leelynn Whetsel, Delice Bison, DO  oxyCODONE-acetaminophen (PERCOCET) 5-325 MG tablet Take 2 tablets by mouth every 6 (six) hours as needed for severe pain. 01/11/23 01/11/24 Yes Aarika Moon, Delice Bison, DO  acetaminophen (TYLENOL) 500 MG tablet Take 500 mg by mouth every 6 (six) hours as needed. Patient not taking: Reported on 12/29/2022    [provider]  acetaminophen (TYLENOL) 500 MG tablet Take 2 tablets (1,000 mg total) by mouth every 6 (six) hours as needed for mild pain. 11/07/22   Piscoya, Jacqulyn Bath, MD  ALOE VERA PO Take 1 tablet by mouth daily. Natural aloe    [provider]  ascorbic acid (VITAMIN C) 500 MG tablet Take 500 mg by mouth daily.    [provider]  B COMPLEX VITAMINS PO Take 1 tablet by mouth daily.    [provider]  celecoxib (CELEBREX) 200 MG capsule Take 200 mg by mouth daily as needed.    [provider]  celecoxib (CELEBREX) 200 MG capsule Take 1 capsule (200 mg total) by mouth 2 (two) times daily as needed for moderate pain. 11/07/22   Piscoya,  Jose, MD  cetirizine (ZYRTEC) 10 MG tablet Take 10 mg by mouth daily.    [provider]  diphenhydrAMINE (BENADRYL) 25 mg capsule Take 25 mg by mouth every 6 (six) hours as needed.    [provider]  fluticasone (FLONASE) 50 MCG/ACT nasal spray Place 1 spray into both nostrils daily.    [provider]  furosemide (LASIX) 40 MG tablet Take 40 mg by mouth daily as needed.    [provider]  gabapentin (NEURONTIN) 300 MG capsule Take 300 mg by mouth at bedtime. 06/28/16   [provider]  hydrOXYzine (ATARAX) 10 MG tablet Take 10 mg by mouth 2 (two) times daily. 09/26/22   [provider]  letrozole (FEMARA)  2.5 MG tablet Take 1 tablet (2.5 mg total) by mouth daily. 12/29/22   Lloyd Huger, MD  linaclotide (LINZESS) 290 MCG CAPS capsule Take 290 mcg by mouth daily before breakfast.    [provider]  metoprolol succinate (TOPROL-XL) 50 MG 24 hr tablet Take 50 mg by mouth daily. Take with or immediately following a meal.    [provider]  montelukast (SINGULAIR) 10 MG tablet Take 10 mg by mouth at bedtime.    [provider]  multivitamin-lutein (OCUVITE-LUTEIN) CAPS capsule Take 1 capsule by mouth as needed.    [provider]  omeprazole (PRILOSEC) 40 MG capsule  09/13/17   [provider]  tiZANidine (ZANAFLEX) 4 MG tablet Take 4 mg by mouth every 8 (eight) hours as needed for muscle spasms.    [provider]  traZODone (DESYREL) 100 MG tablet Take 100-300 mg by mouth at bedtime.    [provider]    Physical Exam   Triage Vital Signs: ED Triage Vitals  Enc Vitals Group     BP 01/11/23 0053 (!) 154/83     Pulse Rate 01/11/23 0053 (!) 56     Resp 01/11/23 0053 16     Temp 01/11/23 0053 98.7 F (37.1 C)     Temp Source 01/11/23 0053 Oral     SpO2 01/11/23 0053 97 %     Weight --      Height --      Head Circumference --      Peak Flow --      Pain Score 01/11/23 0113 9     Pain Loc --      Pain Edu? --      Excl. in [place]? --     Most recent vital signs: Vitals:   01/11/23 0053  BP: (!) 154/83  Pulse: (!) 56  Resp: 16  Temp: 98.7 F (37.1 C)  SpO2: 97%    CONSTITUTIONAL: Alert, responds appropriately to questions. Well-appearing; well-nourished, afebrile HEAD: Normocephalic, atraumatic EYES: Conjunctivae clear, pupils appear equal, sclera nonicteric ENT: normal nose; moist mucous membranes NECK: Supple, normal ROM CARD: RRR; S1 and S2 appreciated RESP: Normal chest excursion without splinting or tachypnea; breath sounds clear and equal bilaterally; no wheezes, no rhonchi, no rales, no hypoxia or  respiratory distress, speaking full sentences ABD/GI: Non-distended; soft, non-tender, no rebound, no guarding, no peritoneal signs BACK: The back appears normal EXT: Patient has redness and warmth noted to the left great toe without deformity.  2+ left DP pulse.  No calf tenderness or calf swelling.  Compartments soft.  Normal capillary refill.  Decreased range of motion of the left great toe secondary to significant pain with any light touch or movement. SKIN: Normal color for  age and race; warm; no rash on exposed skin NEURO: Moves all extremities equally, normal speech PSYCH: The patient's mood and manner are appropriate.   ED Results / Procedures / Treatments   LABS: (all labs ordered are listed, but only abnormal results are displayed) Labs Reviewed  BASIC METABOLIC PANEL - Abnormal; Notable for the following components:      Result Value   Glucose, Bld 105 (*)    All other components within normal limits  TROPONIN I (HIGH SENSITIVITY) - Abnormal; Notable for the following components:   Troponin I (High Sensitivity) 20 (*)    All other components within normal limits  CBC WITH DIFFERENTIAL/PLATELET  TROPONIN I (HIGH SENSITIVITY)     EKG:  EKG Interpretation  Date/Time:  Thursday January 11 2023 01:05:39 EST Ventricular Rate:  54 PR Interval:  144 QRS Duration: 90 QT Interval:  422 QTC Calculation: 400 R Axis:   47 Text Interpretation: Sinus bradycardia Otherwise normal ECG When compared with ECG of 30-Oct-2022 08:57, No significant change was found Confirmed by Pryor Curia [phone]) on 01/11/2023 3:18:55 AM         RADIOLOGY: My personal review and interpretation of imaging: Venous Doppler shows no DVT.  I have personally reviewed all radiology reports.   US Venous Img Lower Unilateral Left  Result Date: 01/11/2023 CLINICAL DATA:  Left leg pain and swelling EXAM: LEFT LOWER EXTREMITY VENOUS DOPPLER ULTRASOUND TECHNIQUE: Gray-scale sonography with compression, as  well as color and duplex ultrasound, were performed to evaluate the deep venous system(s) from the level of the common femoral vein through the popliteal and proximal calf veins. COMPARISON:  None Available. FINDINGS: VENOUS Normal compressibility of the common femoral, superficial femoral, and popliteal veins, as well as the visualized calf veins. Visualized portions of profunda femoral vein and great saphenous vein unremarkable. No filling defects to suggest DVT on grayscale or color Doppler imaging. Doppler waveforms show normal direction of venous flow, normal respiratory plasticity and response to augmentation. Limited views of the contralateral common femoral vein are unremarkable. OTHER None. Limitations: none IMPRESSION: Negative. Electronically Signed   By: Merilyn Baba M.D.   On: 01/11/2023 02:35     PROCEDURES:  Critical Care performed: No    Procedures    IMPRESSION / MDM / ASSESSMENT AND PLAN / ED COURSE  I reviewed the triage vital signs and the nursing notes.    Patient here with left foot pain, left great toe pain consistent with previous episodes of gout.    DIFFERENTIAL DIAGNOSIS (includes but not limited to):   Gout, doubt fracture, septic arthritis, compartment syndrome, arterial obstruction.  DVT is on the differential given history of breast cancer.   Patient's presentation is most consistent with acute complicated illness / injury requiring diagnostic workup.   PLAN: Workup was initiated from triage.  No leukocytosis, normal creatinine.  Normal electrolytes.  Troponin 20 but she is not having any chest pain and her EKG shows no new changes.  We did obtain a Doppler of the left extremity from triage which showed no DVT.  Suspect the patient has gout.  No indication for x-rays today.  Will discharge with Indocin, colchicine and Percocet.  She drove herself to the emergency department.  Will give Indocin and colchicine here but discussed with her that I cannot provide  her with narcotics in the ED if she is driving home.  She verbalized understanding.  Will apply Ace wrap to apply compression for comfort to help with her throbbing  pain and will put her in a postoperative shoe given she is having a hard time putting her foot into a shoe because of pain in the toe.  Recommended rest.  She has a PCP for follow-up.  No signs of cellulitis, abscess or gangrene septic arthritis.  I do not feel she needs antibiotics.   MEDICATIONS GIVEN IN ED: Medications  colchicine tablet 1.2 mg (1.2 mg Oral Given 01/11/23 0356)  indomethacin (INDOCIN) capsule 50 mg (50 mg Oral Given 01/11/23 0356)     ED COURSE:  At this time, I do not feel there is any life-threatening condition present. I reviewed all nursing notes, vitals, pertinent previous records.  All lab and urine results, EKGs, imaging ordered have been independently reviewed and interpreted by myself.  I reviewed all available radiology reports from any imaging ordered this visit.  Based on my assessment, I feel the patient is safe to be discharged home without further emergent workup and can continue workup as an outpatient as needed. Discussed all findings, treatment plan as well as usual and customary return precautions.  They verbalize understanding and are comfortable with this plan.  Outpatient follow-up has been provided as needed.  All questions have been answered.    CONSULTS:  none   OUTSIDE RECORDS REVIEWED: Reviewed last oncology note with Dr. Grayland Ormond on 12/29/2022.       FINAL CLINICAL IMPRESSION(S) / ED DIAGNOSES   Final diagnoses:  Acute idiopathic gout involving toe of left foot     Rx / DC Orders   ED Discharge Orders          Ordered    colchicine 0.6 MG tablet  Daily        01/11/23 0325    indomethacin (INDOCIN) 50 MG capsule  3 times daily with meals        01/11/23 0325    oxyCODONE-acetaminophen (PERCOCET) 5-325 MG tablet  Every 6 hours PRN        01/11/23 0325    ondansetron  (ZOFRAN-ODT) 4 MG disintegrating tablet  Every 6 hours PRN        01/11/23 0325             Note:  This document was prepared using Dragon voice recognition software and may include unintentional dictation errors.   Maximillian Habibi, Delice Bison, DO 01/11/23 [phone]

## 2023-01-11 NOTE — Discharge Instructions (Addendum)

## 2023-01-11 NOTE — ED Triage Notes (Signed)
Pt c/o left foot/ankle swelling and pain since receiving 10 radiation treatments with the 10th on Monday. Pt noted to have bilateral thumb swelling as well. Hx/o breast cancer. Radiation on the left breast.

## 2023-01-22 ENCOUNTER — Telehealth: Payer: Self-pay | Admitting: *Deleted

## 2023-01-22 NOTE — Telephone Encounter (Signed)
Patient called reporting that she is having intermittent pains every 3 days or so in her breast that she just completed radiation therapy in. She is asking if this is normal. I told her that it could be some scarring and to give it another week and see if it improves. She completed radiation therapy 01/01/23 and if not better to call back, or if it gets worse to call back sooner. Patient in agreement with this

## 2023-02-01 ENCOUNTER — Ambulatory Visit
Admission: RE | Admit: 2023-02-01 | Discharge: 2023-02-01 | Disposition: A | Discharge status: Home / Self Care | Payer: Medicare HMO | Source: Ambulatory Visit | Attending: Radiation Oncology | Admitting: Radiation Oncology

## 2023-02-01 ENCOUNTER — Encounter: Payer: Self-pay | Admitting: Radiation Oncology

## 2023-02-01 VITALS — BP 140/92 | HR 58 | Temp 98.0°F | Resp 20 | Wt 258.2 lb

## 2023-02-01 DIAGNOSIS — C50912 Malignant neoplasm of unspecified site of left female breast: Secondary | ICD-10-CM | POA: Diagnosis present

## 2023-02-01 DIAGNOSIS — Z170 Estrogen receptor positive status [ER+]: Secondary | ICD-10-CM | POA: Diagnosis present

## 2023-02-01 NOTE — Progress Notes (Signed)
Radiation Oncology Follow up Note  Name: Heather Mcintyre   Date:   02/01/2023 MRN:  [id number] DOB: 10-11-1966    This 57 y.o. 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  Seen today in routine follow-up she states the lumpectomy site still has some pulling which I explained to her is scar tissue.  She is also had some problems with gout although assured her this is nothing to do with radiation treatment.  She has not yet seen medical oncology for discussion of endocrine therapy..  COMPLICATIONS OF TREATMENT: none  FOLLOW UP COMPLIANCE: keeps appointments   PHYSICAL EXAM:  BP (!) 140/92 (BP Location: Right Wrist, Patient Position: Sitting, Cuff Size: Small) Comment: patient states her BP is elevated due to her pain level  Pulse (!) 58   Temp 98 F (36.7 C) (Tympanic)   Resp 20   Wt 258 lb 3.2 oz (117.1 kg)   BMI 39.26 kg/m  Lungs are clear to A&P cardiac examination essentially unremarkable with regular rate and rhythm. No dominant mass or nodularity is noted in either breast in 2 positions examined. Incision is well-healed. No axillary or supraclavicular adenopathy is appreciated. Cosmetic result is excellent.  Well-developed well-nourished patient in NAD. HEENT reveals PERLA, EOMI, discs not visualized.  Oral cavity is clear. No oral mucosal lesions are identified. Neck is clear without evidence of cervical or supraclavicular adenopathy. Lungs are clear to A&P. Cardiac examination is essentially unremarkable with regular rate and rhythm without murmur rub or thrill. Abdomen is benign with no organomegaly or masses noted. Motor sensory  and DTR levels are equal and symmetric in the upper and lower extremities. Cranial nerves II through XII are grossly intact. Proprioception is intact. No peripheral adenopathy or edema is identified. No motor or sensory levels are noted. Crude visual fields are within normal range.  RADIOLOGY RESULTS: No current films for review  PLAN: Present time of asked to see her back in 6 months for follow-up.  I may arrange an appointment for follow-up with Dr. Grayland Ormond to discuss endocrine therapy.  Patient is to call with any concerns at any time.  I would like to take this opportunity to thank you for allowing me to participate in the care of your patient.Noreene Filbert, MD

## 2023-02-13 ENCOUNTER — Other Ambulatory Visit: Payer: Medicare HMO

## 2023-02-22 ENCOUNTER — Ambulatory Visit
Admission: RE | Admit: 2023-02-22 | Discharge: 2023-02-22 | Disposition: A | Discharge status: Home / Self Care | Payer: Medicare HMO | Source: Ambulatory Visit | Attending: Oncology | Admitting: Oncology

## 2023-02-22 DIAGNOSIS — Z780 Asymptomatic menopausal state: Secondary | ICD-10-CM | POA: Diagnosis not present

## 2023-02-22 DIAGNOSIS — C50912 Malignant neoplasm of unspecified site of left female breast: Secondary | ICD-10-CM | POA: Diagnosis present

## 2023-02-22 DIAGNOSIS — Z13820 Encounter for screening for osteoporosis: Secondary | ICD-10-CM | POA: Insufficient documentation

## 2023-02-22 DIAGNOSIS — M069 Rheumatoid arthritis, unspecified: Secondary | ICD-10-CM | POA: Diagnosis not present

## 2023-03-28 ENCOUNTER — Encounter: Payer: Self-pay | Admitting: Oncology

## 2023-03-28 ENCOUNTER — Inpatient Hospital Stay: Payer: Medicare HMO | Attending: Oncology | Admitting: Oncology

## 2023-03-28 VITALS — BP 158/73 | HR 57 | Temp 96.4°F | Resp 18 | Ht 68.0 in | Wt 260.0 lb

## 2023-03-28 DIAGNOSIS — Z90710 Acquired absence of both cervix and uterus: Secondary | ICD-10-CM | POA: Insufficient documentation

## 2023-03-28 DIAGNOSIS — Z79811 Long term (current) use of aromatase inhibitors: Secondary | ICD-10-CM | POA: Diagnosis not present

## 2023-03-28 DIAGNOSIS — Z8541 Personal history of malignant neoplasm of cervix uteri: Secondary | ICD-10-CM | POA: Diagnosis not present

## 2023-03-28 DIAGNOSIS — I10 Essential (primary) hypertension: Secondary | ICD-10-CM | POA: Diagnosis not present

## 2023-03-28 DIAGNOSIS — Z923 Personal history of irradiation: Secondary | ICD-10-CM | POA: Insufficient documentation

## 2023-03-28 DIAGNOSIS — C50912 Malignant neoplasm of unspecified site of left female breast: Secondary | ICD-10-CM

## 2023-03-28 DIAGNOSIS — Z888 Allergy status to other drugs, medicaments and biological substances status: Secondary | ICD-10-CM | POA: Insufficient documentation

## 2023-03-28 DIAGNOSIS — Z881 Allergy status to other antibiotic agents status: Secondary | ICD-10-CM | POA: Diagnosis not present

## 2023-03-28 DIAGNOSIS — Z880 Allergy status to penicillin: Secondary | ICD-10-CM | POA: Diagnosis not present

## 2023-03-28 DIAGNOSIS — Z8249 Family history of ischemic heart disease and other diseases of the circulatory system: Secondary | ICD-10-CM | POA: Diagnosis not present

## 2023-03-28 DIAGNOSIS — Z79899 Other long term (current) drug therapy: Secondary | ICD-10-CM | POA: Insufficient documentation

## 2023-03-28 DIAGNOSIS — M1990 Unspecified osteoarthritis, unspecified site: Secondary | ICD-10-CM | POA: Insufficient documentation

## 2023-03-28 DIAGNOSIS — Z170 Estrogen receptor positive status [ER+]: Secondary | ICD-10-CM | POA: Insufficient documentation

## 2023-03-28 DIAGNOSIS — Z91041 Radiographic dye allergy status: Secondary | ICD-10-CM | POA: Diagnosis not present

## 2023-03-28 DIAGNOSIS — R232 Flushing: Secondary | ICD-10-CM | POA: Insufficient documentation

## 2023-03-28 DIAGNOSIS — Z9049 Acquired absence of other specified parts of digestive tract: Secondary | ICD-10-CM | POA: Insufficient documentation

## 2023-03-28 MED ORDER — ANASTROZOLE 1 MG PO TABS: 1.0000 mg | ORAL_TABLET | Freq: Every day | ORAL | 3 refills | Status: DC

## 2023-03-28 NOTE — Progress Notes (Signed)
Survivorship Care Plan visit completed.  Treatment summary reviewed and given to patient.  ASCO answers booklet reviewed and given to patient.  CARE program and Cancer Transitions discussed with patient along with other resources cancer center offers to patients and caregivers.  Patient verbalized understanding.    

## 2023-03-28 NOTE — Progress Notes (Signed)
Has a lump in her left breast that she wants to talk about. States she knows she was told it is scar tissue but it still concerns her.

## 2023-03-28 NOTE — Progress Notes (Signed)
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  Oncotype Dx score 0.  INTERVAL HISTORY: Patient returns to clinic today for routine 65-month evaluation/toleration of letrozole.  She is having significant hot flashes with treatment.  She otherwise feels well.  She has no neurologic complaints.  She denies any recent fevers or illnesses.  She has a good appetite and denies weight loss.  She has no chest pain, shortness of breath, cough, or hemoptysis.  She denies any nausea, vomiting, constipation, or diarrhea.  She has no urinary complaints.  Patient offers no further specific complaints today.  REVIEW OF SYSTEMS:   Review of Systems  Constitutional: Negative.  Negative for fever, malaise/fatigue and weight loss.  Respiratory: Negative.  Negative for cough, hemoptysis and shortness of breath.   Cardiovascular: Negative.  Negative for chest pain and leg swelling.  Gastrointestinal: Negative.  Negative for abdominal pain.  Genitourinary: Negative.  Negative for dysuria.  Musculoskeletal: Negative.  Negative for back pain.  Skin: Negative.  Negative for rash.  Neurological:  Positive for sensory change. Negative for dizziness, focal weakness, weakness and headaches.  Psychiatric/Behavioral: Negative.  The patient is not nervous/anxious.     As per HPI. Otherwise, a complete review of systems is negative.  PAST MEDICAL HISTORY: Past  Medical History:  Diagnosis Date   Anxiety    Asthma    Breast cancer (HCC)    Cervical cancer (HCC)    Complication of anesthesia    woke up in the middle of surgery 2010   Degenerative joint disease/osteoarthritis    Depression    GERD (gastroesophageal reflux disease)    Hypertension    Lupus (HCC)    Pre-diabetes     PAST SURGICAL HISTORY: Past Surgical History:  Procedure Laterality Date   ABDOMINAL HYSTERECTOMY     BREAST BIOPSY Left 10/04/2022   [place] LT BREAST BX W LOC DEV 1ST LESION IMG BX SPEC US GUIDE 10/04/2022 [facility]   BREAST LUMPECTOMY,RADIO FREQ LOCALIZER,AXILLARY SENTINEL LYMPH NODE BIOPSY Left 11/07/2022   Procedure: BREAST LUMPECTOMY,RADIO FREQ LOCALIZER,AXILLARY SENTINEL LYMPH NODE BIOPSY;  Surgeon: Henrene Dodge, MD;  Location: [facility] ORS;  Service: General;  Laterality: Left;   CHOLECYSTECTOMY N/A    COLONOSCOPY WITH ESOPHAGOGASTRODUODENOSCOPY (EGD)     JOINT REPLACEMENT     LAPAROSCOPIC GASTRIC BAND REMOVAL WITH LAPAROSCOPIC GASTRIC SLEEVE RESECTION     REPLACEMENT TOTAL KNEE BILATERAL     TUBAL LIGATION      FAMILY HISTORY: Family History  Problem Relation Age of Onset   Hypertension Mother    Hypertension Father    Breast cancer Neg Hx     ADVANCED DIRECTIVES (Y/N):  N  HEALTH MAINTENANCE: Social History   Tobacco Use   Smoking status: Never   Smokeless tobacco: Never  Vaping Use   Vaping Use: Never used  Substance Use Topics   Alcohol use: No   Drug use: Never     Colonoscopy:  PAP:  Bone density:  Lipid panel:  Allergies  Allergen Reactions  Cephalexin Hives and Other (See Comments)   Etodolac Hives   Iodinated Contrast Media Hives   Latex Hives and Other (See Comments)   Penicillin G Hives   Fish-Derived Products Other (See Comments)   Iodine Other (See Comments)   Penicillins    Lisinopril Other (See Comments)    Current Outpatient Medications  Medication Sig Dispense Refill   acetaminophen (TYLENOL) 500 MG  tablet Take 2 tablets (1,000 mg total) by mouth every 6 (six) hours as needed for mild pain.     ALOE VERA PO Take 1 tablet by mouth daily. Natural aloe     anastrozole (ARIMIDEX) 1 MG tablet Take 1 tablet (1 mg total) by mouth daily. 30 tablet 3   ascorbic acid (VITAMIN C) 500 MG tablet Take 500 mg by mouth daily.     B COMPLEX VITAMINS PO Take 1 tablet by mouth daily.     celecoxib (CELEBREX) 200 MG capsule Take 1 capsule (200 mg total) by mouth 2 (two) times daily as needed for moderate pain. 40 capsule 1   cetirizine (ZYRTEC) 10 MG tablet Take 10 mg by mouth daily.     colchicine 0.6 MG tablet Take 1 tablet (0.6 mg total) by mouth daily. 30 tablet 0   diphenhydrAMINE (BENADRYL) 25 mg capsule Take 25 mg by mouth every 6 (six) hours as needed.     fluticasone (FLONASE) 50 MCG/ACT nasal spray Place 1 spray into both nostrils daily.     furosemide (LASIX) 40 MG tablet Take 40 mg by mouth daily as needed.     gabapentin (NEURONTIN) 300 MG capsule Take 300 mg by mouth at bedtime.     hydrOXYzine (ATARAX) 10 MG tablet Take 10 mg by mouth 2 (two) times daily.     indomethacin (INDOCIN) 50 MG capsule Take 1 capsule (50 mg total) by mouth 3 (three) times daily with meals. 21 capsule 0   linaclotide (LINZESS) 290 MCG CAPS capsule Take 290 mcg by mouth daily before breakfast.     metoprolol succinate (TOPROL-XL) 50 MG 24 hr tablet Take 50 mg by mouth daily. Take with or immediately following a meal.     montelukast (SINGULAIR) 10 MG tablet Take 10 mg by mouth at bedtime.     multivitamin-lutein (OCUVITE-LUTEIN) CAPS capsule Take 1 capsule by mouth as needed.     omeprazole (PRILOSEC) 40 MG capsule      ondansetron (ZOFRAN-ODT) 4 MG disintegrating tablet Take 1 tablet (4 mg total) by mouth every 6 (six) hours as needed for nausea or vomiting. 20 tablet 0   oxyCODONE-acetaminophen (PERCOCET) 5-325 MG tablet Take 2 tablets by mouth every 6 (six) hours as needed for severe pain. 20 tablet 0   tiZANidine  (ZANAFLEX) 4 MG tablet Take 4 mg by mouth every 8 (eight) hours as needed for muscle spasms.     traZODone (DESYREL) 100 MG tablet Take 100-300 mg by mouth at bedtime.     No current facility-administered medications for this visit.    OBJECTIVE: Vitals:   03/28/23 1012  BP: (!) 158/73  Pulse: (!) 57  Resp: 18  Temp: (!) 96.4 F (35.8 C)  SpO2: 100%     Body mass index is 39.53 kg/m.    ECOG FS:0 - Asymptomatic  General: Well-developed, well-nourished, no acute distress. Eyes: Pink conjunctiva, anicteric sclera. HEENT: Normocephalic, moist mucous membranes. Breast: Exam deferred today. Lungs: No audible wheezing or coughing. Heart: Regular rate and rhythm. Abdomen: Soft, nontender, no obvious distention. Musculoskeletal: No  edema, cyanosis, or clubbing. Neuro: Alert, answering all questions appropriately. Cranial nerves grossly intact. Skin: No rashes or petechiae noted. Psych: Normal affect.   LAB RESULTS:  Lab Results  Component Value Date   NA 136 01/11/2023   K 3.5 01/11/2023   CL 100 01/11/2023   CO2 26 01/11/2023   GLUCOSE 105 (H) 01/11/2023   BUN 16 01/11/2023   CREATININE 0.79 01/11/2023   CALCIUM 9.1 01/11/2023   GFRNONAA >60 01/11/2023   GFRAA >60 02/10/2017    Lab Results  Component Value Date   WBC 7.0 01/11/2023   NEUTROABS 3.4 01/11/2023   HGB 12.1 01/11/2023   HCT 37.1 01/11/2023   MCV 88.5 01/11/2023   PLT 261 01/11/2023     STUDIES: No results found.  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  Oncotype Dx score 0.  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  Oncotype Dx score was 0 indicating low risk and no benefit of adjuvant chemotherapy.  Patient completed adjuvant XRT in February 2024.  Letrozole has been discontinued secondary to hot flashes the patient was given a  prescription for anastrozole.  Recommendation is to take aromatase inhibitor for a total of 5 years completing in February 2029.  Return to clinic in 3 months for routine evaluation.   Bone health: Bone mineral density on February 22, 2023 reported T-score of -0.3 which is considered normal.  Repeat in March 2026.   Patient expressed understanding and was in agreement with this plan. She also understands that She can call clinic at any time with any questions, concerns, or complaints.    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

## 2023-03-30 ENCOUNTER — Ambulatory Visit: Payer: Medicare HMO | Admitting: Oncology

## 2023-04-11 ENCOUNTER — Observation Stay
Admission: RE | Admit: 2023-04-11 | Discharge: 2023-04-12 | Disposition: A | Discharge status: Home / Self Care | Payer: Medicare HMO | Attending: Surgery | Admitting: Surgery

## 2023-04-11 ENCOUNTER — Telehealth: Payer: Self-pay | Admitting: Surgery

## 2023-04-11 ENCOUNTER — Encounter
Admission: RE | Disposition: A | Discharge status: Home / Self Care | Payer: Self-pay | Source: Home / Self Care | Attending: Surgery

## 2023-04-11 ENCOUNTER — Other Ambulatory Visit: Payer: Self-pay

## 2023-04-11 ENCOUNTER — Ambulatory Visit: Payer: Medicare HMO | Admitting: Certified Registered"

## 2023-04-11 ENCOUNTER — Encounter: Payer: Self-pay | Admitting: Surgery

## 2023-04-11 ENCOUNTER — Ambulatory Visit (INDEPENDENT_AMBULATORY_CARE_PROVIDER_SITE_OTHER): Payer: Medicare HMO | Admitting: Surgery

## 2023-04-11 VITALS — BP 177/89 | HR 63 | Temp 98.6°F | Ht 68.0 in | Wt 262.0 lb

## 2023-04-11 DIAGNOSIS — I10 Essential (primary) hypertension: Secondary | ICD-10-CM | POA: Diagnosis not present

## 2023-04-11 DIAGNOSIS — Z853 Personal history of malignant neoplasm of breast: Secondary | ICD-10-CM | POA: Diagnosis not present

## 2023-04-11 DIAGNOSIS — Z96653 Presence of artificial knee joint, bilateral: Secondary | ICD-10-CM | POA: Insufficient documentation

## 2023-04-11 DIAGNOSIS — N611 Abscess of the breast and nipple: Secondary | ICD-10-CM

## 2023-04-11 DIAGNOSIS — Z79899 Other long term (current) drug therapy: Secondary | ICD-10-CM | POA: Insufficient documentation

## 2023-04-11 DIAGNOSIS — J45909 Unspecified asthma, uncomplicated: Secondary | ICD-10-CM | POA: Diagnosis not present

## 2023-04-11 DIAGNOSIS — Z8541 Personal history of malignant neoplasm of cervix uteri: Secondary | ICD-10-CM | POA: Diagnosis not present

## 2023-04-11 DIAGNOSIS — Z91040 Latex allergy status: Secondary | ICD-10-CM | POA: Diagnosis not present

## 2023-04-11 DIAGNOSIS — C50912 Malignant neoplasm of unspecified site of left female breast: Secondary | ICD-10-CM

## 2023-04-11 HISTORY — PX: INCISION AND DRAINAGE ABSCESS: SHX5864

## 2023-04-11 LAB — BASIC METABOLIC PANEL
Anion gap: 9 (ref 5–15)
BUN: 17 mg/dL (ref 6–20)
CO2: 28 mmol/L (ref 22–32)
Calcium: 8.9 mg/dL (ref 8.9–10.3)
Chloride: 103 mmol/L (ref 98–111)
Creatinine, Ser: 0.84 mg/dL (ref 0.44–1.00)
GFR, Estimated: >60 mL/min (ref >60)
Glucose, Bld: 91 mg/dL (ref 70–99)
Potassium: 3.8 mmol/L (ref 3.5–5.1)
Sodium: 140 mmol/L (ref 135–145)

## 2023-04-11 LAB — CBC WITH DIFFERENTIAL/PLATELET
Abs Immature Granulocytes: 0.03 10*3/uL (ref 0.00–0.07)
Basophils Absolute: 0 10*3/uL (ref 0.0–0.1)
Basophils Relative: 0 %
Eosinophils Absolute: 0.1 10*3/uL (ref 0.0–0.5)
Eosinophils Relative: 1 %
HCT: 38.6 % (ref 36.0–46.0)
Hemoglobin: 12.7 g/dL (ref 12.0–15.0)
Immature Granulocytes: 0 %
Lymphocytes Relative: 38 %
Lymphs Abs: 3.4 10*3/uL (ref 0.7–4.0)
MCH: 29.8 pg (ref 26.0–34.0)
MCHC: 32.9 g/dL (ref 30.0–36.0)
MCV: 90.6 fL (ref 80.0–100.0)
Monocytes Absolute: 0.7 10*3/uL (ref 0.1–1.0)
Monocytes Relative: 8 %
Neutro Abs: 4.9 10*3/uL (ref 1.7–7.7)
Neutrophils Relative %: 53 %
Platelets: 265 10*3/uL (ref 150–400)
RBC: 4.26 MIL/uL (ref 3.87–5.11)
RDW: 13.7 % (ref 11.5–15.5)
WBC: 9.1 10*3/uL (ref 4.0–10.5)
nRBC: 0 % (ref 0.0–0.2)

## 2023-04-11 SURGERY — INCISION AND DRAINAGE, ABSCESS
Anesthesia: General | Laterality: Left

## 2023-04-11 MED ORDER — FENTANYL CITRATE (PF) 100 MCG/2ML IJ SOLN
INTRAMUSCULAR | Status: DC | PRN
  Administered 2023-04-11 (×2): 50 ug via INTRAVENOUS

## 2023-04-11 MED ORDER — LACTATED RINGERS IV SOLN: INTRAVENOUS | Status: DC

## 2023-04-11 MED ORDER — FENTANYL CITRATE (PF) 100 MCG/2ML IJ SOLN: 25.0000 ug | INTRAMUSCULAR | Status: DC | PRN

## 2023-04-11 MED ORDER — CELECOXIB 200 MG PO CAPS: 200.0000 mg | ORAL_CAPSULE | Freq: Two times a day (BID) | ORAL | Status: DC | PRN

## 2023-04-11 MED ORDER — CHLORHEXIDINE GLUCONATE CLOTH 2 % EX PADS: 6.0000 | MEDICATED_PAD | Freq: Once | CUTANEOUS | Status: DC

## 2023-04-11 MED ORDER — CHLORHEXIDINE GLUCONATE 0.12 % MT SOLN
OROMUCOSAL | Status: AC
  Filled 2023-04-11: qty 15

## 2023-04-11 MED ORDER — SODIUM CHLORIDE 0.9 % IR SOLN
Status: DC | PRN
  Administered 2023-04-11: 500 mL

## 2023-04-11 MED ORDER — ACETAMINOPHEN 500 MG PO TABS
1000.0000 mg | ORAL_TABLET | Freq: Four times a day (QID) | ORAL | Status: DC
  Administered 2023-04-12 (×2): 1000 mg via ORAL

## 2023-04-11 MED ORDER — HYDROXYZINE HCL 10 MG PO TABS
10.0000 mg | ORAL_TABLET | Freq: Two times a day (BID) | ORAL | Status: DC
  Administered 2023-04-11 – 2023-04-12 (×2): 10 mg via ORAL
  Filled 2023-04-11 (×2): qty 1

## 2023-04-11 MED ORDER — ONDANSETRON HCL 4 MG/2ML IJ SOLN: 4.0000 mg | Freq: Once | INTRAMUSCULAR | Status: DC | PRN

## 2023-04-11 MED ORDER — LACTATED RINGERS IV SOLN: INTRAVENOUS | Status: DC | PRN

## 2023-04-11 MED ORDER — ONDANSETRON HCL 4 MG/2ML IJ SOLN: 4.0000 mg | Freq: Four times a day (QID) | INTRAMUSCULAR | Status: DC | PRN

## 2023-04-11 MED ORDER — VANCOMYCIN HCL 1500 MG/300ML IV SOLN
1500.0000 mg | INTRAVENOUS | Status: AC
  Administered 2023-04-11: 1500 mg via INTRAVENOUS
  Filled 2023-04-11: qty 300

## 2023-04-11 MED ORDER — PANTOPRAZOLE SODIUM 40 MG IV SOLR
40.0000 mg | Freq: Every day | INTRAVENOUS | Status: DC
  Administered 2023-04-11: 40 mg via INTRAVENOUS

## 2023-04-11 MED ORDER — ANASTROZOLE 1 MG PO TABS: 1.0000 mg | ORAL_TABLET | Freq: Every day | ORAL | Status: DC

## 2023-04-11 MED ORDER — ACETAMINOPHEN 500 MG PO TABS
1000.0000 mg | ORAL_TABLET | ORAL | Status: AC
  Administered 2023-04-11: 1000 mg via ORAL

## 2023-04-11 MED ORDER — VANCOMYCIN HCL IN DEXTROSE 1-5 GM/200ML-% IV SOLN
INTRAVENOUS | Status: AC
  Filled 2023-04-11: qty 200

## 2023-04-11 MED ORDER — CHLORHEXIDINE GLUCONATE CLOTH 2 % EX PADS
6.0000 | MEDICATED_PAD | Freq: Once | CUTANEOUS | Status: AC
  Administered 2023-04-11: 6 via TOPICAL

## 2023-04-11 MED ORDER — FLUTICASONE PROPIONATE 50 MCG/ACT NA SUSP: 1.0000 | Freq: Every day | NASAL | Status: DC | PRN

## 2023-04-11 MED ORDER — MONTELUKAST SODIUM 10 MG PO TABS
10.0000 mg | ORAL_TABLET | Freq: Every day | ORAL | Status: DC
  Administered 2023-04-11: 10 mg via ORAL
  Filled 2023-04-11: qty 1

## 2023-04-11 MED ORDER — ACETAMINOPHEN 500 MG PO TABS
ORAL_TABLET | ORAL | Status: AC
  Filled 2023-04-11: qty 2

## 2023-04-11 MED ORDER — ONDANSETRON 4 MG PO TBDP: 4.0000 mg | ORAL_TABLET | Freq: Four times a day (QID) | ORAL | Status: DC | PRN

## 2023-04-11 MED ORDER — DEXAMETHASONE SODIUM PHOSPHATE 10 MG/ML IJ SOLN
INTRAMUSCULAR | Status: DC | PRN
  Administered 2023-04-11: 10 mg via INTRAVENOUS

## 2023-04-11 MED ORDER — B COMPLEX-C PO TABS
1.0000 | ORAL_TABLET | Freq: Every day | ORAL | Status: DC
  Administered 2023-04-12: 1 via ORAL
  Filled 2023-04-11: qty 1

## 2023-04-11 MED ORDER — OXYCODONE HCL 5 MG PO TABS
5.0000 mg | ORAL_TABLET | ORAL | Status: DC | PRN
  Administered 2023-04-12: 5 mg via ORAL

## 2023-04-11 MED ORDER — ROCURONIUM BROMIDE 100 MG/10ML IV SOLN
INTRAVENOUS | Status: DC | PRN
  Administered 2023-04-11: 50 mg via INTRAVENOUS

## 2023-04-11 MED ORDER — VANCOMYCIN HCL IN DEXTROSE 1-5 GM/200ML-% IV SOLN
1000.0000 mg | Freq: Two times a day (BID) | INTRAVENOUS | Status: DC
  Administered 2023-04-11 – 2023-04-12 (×2): 1000 mg via INTRAVENOUS

## 2023-04-11 MED ORDER — ONDANSETRON HCL 4 MG/2ML IJ SOLN
INTRAMUSCULAR | Status: DC | PRN
  Administered 2023-04-11: 4 mg via INTRAVENOUS

## 2023-04-11 MED ORDER — BUPIVACAINE LIPOSOME 1.3 % IJ SUSP
INTRAMUSCULAR | Status: AC
  Filled 2023-04-11: qty 10

## 2023-04-11 MED ORDER — TIZANIDINE HCL 4 MG PO TABS: 4.0000 mg | ORAL_TABLET | Freq: Three times a day (TID) | ORAL | Status: DC | PRN

## 2023-04-11 MED ORDER — ACETAMINOPHEN 10 MG/ML IV SOLN: 1000.0000 mg | Freq: Once | INTRAVENOUS | Status: DC | PRN

## 2023-04-11 MED ORDER — PANTOPRAZOLE SODIUM 40 MG IV SOLR
INTRAVENOUS | Status: AC
  Filled 2023-04-11: qty 10

## 2023-04-11 MED ORDER — INDOMETHACIN 50 MG PO CAPS
50.0000 mg | ORAL_CAPSULE | Freq: Three times a day (TID) | ORAL | Status: DC
  Administered 2023-04-12 (×2): 50 mg via ORAL
  Filled 2023-04-11 (×3): qty 1

## 2023-04-11 MED ORDER — OXYCODONE HCL 5 MG PO TABS: 5.0000 mg | ORAL_TABLET | Freq: Once | ORAL | Status: DC | PRN

## 2023-04-11 MED ORDER — TRAZODONE HCL 100 MG PO TABS
100.0000 mg | ORAL_TABLET | Freq: Every day | ORAL | Status: DC
  Administered 2023-04-11: 300 mg via ORAL
  Filled 2023-04-11: qty 3

## 2023-04-11 MED ORDER — METOPROLOL SUCCINATE ER 25 MG PO TB24
50.0000 mg | ORAL_TABLET | Freq: Every day | ORAL | Status: DC
  Filled 2023-04-11: qty 2

## 2023-04-11 MED ORDER — POLYETHYLENE GLYCOL 3350 17 G PO PACK: 17.0000 g | PACK | Freq: Every day | ORAL | Status: DC | PRN

## 2023-04-11 MED ORDER — BUPIVACAINE LIPOSOME 1.3 % IJ SUSP: 20.0000 mL | Freq: Once | INTRAMUSCULAR | Status: DC

## 2023-04-11 MED ORDER — SUGAMMADEX SODIUM 200 MG/2ML IV SOLN
INTRAVENOUS | Status: DC | PRN
  Administered 2023-04-11: 300 mg via INTRAVENOUS

## 2023-04-11 MED ORDER — LINACLOTIDE 145 MCG PO CAPS
290.0000 ug | ORAL_CAPSULE | Freq: Every day | ORAL | Status: DC
  Administered 2023-04-12: 290 ug via ORAL
  Filled 2023-04-11: qty 2
  Filled 2023-04-11: qty 1

## 2023-04-11 MED ORDER — GABAPENTIN 300 MG PO CAPS
ORAL_CAPSULE | ORAL | Status: AC
  Filled 2023-04-11: qty 1

## 2023-04-11 MED ORDER — BUPIVACAINE HCL (PF) 0.5 % IJ SOLN
INTRAMUSCULAR | Status: AC
  Filled 2023-04-11: qty 30

## 2023-04-11 MED ORDER — ACETAMINOPHEN 500 MG PO TABS
1000.0000 mg | ORAL_TABLET | Freq: Four times a day (QID) | ORAL | Status: DC | PRN
  Administered 2023-04-11: 1000 mg via ORAL

## 2023-04-11 MED ORDER — HYDROMORPHONE HCL 1 MG/ML IJ SOLN: 0.5000 mg | INTRAMUSCULAR | Status: DC | PRN

## 2023-04-11 MED ORDER — VITAMIN C 500 MG PO TABS
500.0000 mg | ORAL_TABLET | Freq: Every day | ORAL | Status: DC
  Administered 2023-04-12: 500 mg via ORAL
  Filled 2023-04-11: qty 1

## 2023-04-11 MED ORDER — COLCHICINE 0.6 MG PO TABS
0.6000 mg | ORAL_TABLET | Freq: Every day | ORAL | Status: DC
  Administered 2023-04-11 – 2023-04-12 (×2): 0.6 mg via ORAL
  Filled 2023-04-11 (×2): qty 1

## 2023-04-11 MED ORDER — LIDOCAINE HCL (CARDIAC) PF 100 MG/5ML IV SOSY
PREFILLED_SYRINGE | INTRAVENOUS | Status: DC | PRN
  Administered 2023-04-11: 100 mg via INTRAVENOUS

## 2023-04-11 MED ORDER — PROPOFOL 10 MG/ML IV BOLUS
INTRAVENOUS | Status: DC | PRN
  Administered 2023-04-11: 200 mg via INTRAVENOUS

## 2023-04-11 MED ORDER — OXYCODONE HCL 5 MG/5ML PO SOLN: 5.0000 mg | Freq: Once | ORAL | Status: DC | PRN

## 2023-04-11 MED ORDER — GABAPENTIN 300 MG PO CAPS
300.0000 mg | ORAL_CAPSULE | ORAL | Status: AC
  Administered 2023-04-11: 300 mg via ORAL

## 2023-04-11 MED ORDER — EPINEPHRINE PF 1 MG/ML IJ SOLN
INTRAMUSCULAR | Status: AC
Start: 2023-04-11 — End: ?
  Filled 2023-04-11: qty 1

## 2023-04-11 MED ORDER — FENTANYL CITRATE (PF) 100 MCG/2ML IJ SOLN
INTRAMUSCULAR | Status: AC
  Filled 2023-04-11: qty 2

## 2023-04-11 MED ORDER — EPINEPHRINE PF 1 MG/ML IJ SOLN
INTRAMUSCULAR | Status: DC | PRN
  Administered 2023-04-11: 40.15 mL

## 2023-04-11 MED ORDER — EPHEDRINE SULFATE (PRESSORS) 50 MG/ML IJ SOLN
INTRAMUSCULAR | Status: DC | PRN
  Administered 2023-04-11: 5 mg via INTRAVENOUS

## 2023-04-11 MED ORDER — MIDAZOLAM HCL 2 MG/2ML IJ SOLN
INTRAMUSCULAR | Status: AC
  Filled 2023-04-11: qty 2

## 2023-04-11 MED ORDER — MIDAZOLAM HCL 2 MG/2ML IJ SOLN
INTRAMUSCULAR | Status: DC | PRN
  Administered 2023-04-11 (×2): 1 mg via INTRAVENOUS

## 2023-04-11 SURGICAL SUPPLY — 28 items
CHLORAPREP W/TINT 26 (MISCELLANEOUS) IMPLANT
CNTNR URN SCR LID CUP LEK RST (MISCELLANEOUS) IMPLANT
CONT SPEC 4OZ STRL OR WHT (MISCELLANEOUS) ×1
DRAPE LAPAROTOMY 100X77 ABD (DRAPES) IMPLANT
ELECT CAUTERY BLADE TIP 2.5 (TIP) ×1
ELECTRODE CAUTERY BLDE TIP 2.5 (TIP) IMPLANT
GAUZE SPONGE 4X4 12PLY STRL (GAUZE/BANDAGES/DRESSINGS) IMPLANT
GLOVE SURG SYN 6.5 ES PF (GLOVE) ×2 IMPLANT
GLOVE SURG SYN 6.5 PF PI (GLOVE) IMPLANT
GLOVE SURG SYN 7.0 (GLOVE) ×1 IMPLANT
GLOVE SURG SYN 7.0 PF PI (GLOVE) IMPLANT
GLOVE SURG SYN 7.5  E (GLOVE) ×1
GLOVE SURG SYN 7.5 E (GLOVE) ×1 IMPLANT
GLOVE SURG SYN 7.5 PF PI (GLOVE) IMPLANT
GLOVE SURG SYN 8.0 (GLOVE) ×1 IMPLANT
GLOVE SURG SYN 8.0 PF PI (GLOVE) IMPLANT
GOWN STRL REUS W/ TWL LRG LVL3 (GOWN DISPOSABLE) IMPLANT
GOWN STRL REUS W/TWL LRG LVL3 (GOWN DISPOSABLE) ×2
KIT TURNOVER KIT A (KITS) IMPLANT
NDL FILTER BLUNT 18X1 1/2 (NEEDLE) IMPLANT
NEEDLE FILTER BLUNT 18X1 1/2 (NEEDLE) ×1 IMPLANT
NS IRRIG 500ML POUR BTL (IV SOLUTION) IMPLANT
PACK BASIN MINOR ARMC (MISCELLANEOUS) IMPLANT
PAD ABD DERMACEA PRESS 5X9 (GAUZE/BANDAGES/DRESSINGS) IMPLANT
SWAB CULTURE AMIES ANAERIB BLU (MISCELLANEOUS) IMPLANT
SYR TB 1ML LL NO SAFETY (SYRINGE) IMPLANT
TAPE CLOTH SURG 4X10 WHT LF (GAUZE/BANDAGES/DRESSINGS) IMPLANT
TAPE TRANSPORE STRL 2 31045 (GAUZE/BANDAGES/DRESSINGS) IMPLANT

## 2023-04-11 NOTE — Consult Note (Signed)
Pharmacy Antibiotic Note  Heather Mcintyre is a 57 y.o. female admitted on 04/11/2023 with L breast abscess s/p I&D 04/11/23. Patient with PMH of L breast lumpectomy 10/2022 with biopsy for breast cancer at the site of abscess.  Pharmacy has been consulted for Vancomycin dosing.  Plan: Vancomycin 1500 mg x 1 given pre-op for surgical prophylaxis Initiate Vancomycin 1000 mg Q24H. Goal AUC 400-550 Estimated AUC 508/Cmin: 14.8 Scr 0.84, IBW, Vd 0.5 (BMI 39.7)   Height: 5\' 8"  (172.7 cm) Weight: 118.8 kg (262 lb) IBW/kg (Calculated) : 63.9  Temp (24hrs), Avg:99.1 F (37.3 C), Min:97.7 F (36.5 C), Max:100.9 F (38.3 C)  Recent Labs  Lab 04/11/23 1606  WBC 9.1  CREATININE 0.84    Estimated Creatinine Clearance: 100.2 mL/min (by C-G formula based on SCr of 0.84 mg/dL).    Allergies  Allergen Reactions   Cephalexin Hives and Other (See Comments)   Etodolac Hives   Iodinated Contrast Media Hives   Latex Hives and Other (See Comments)   Penicillin G Hives   Fish-Derived Products Other (See Comments)   Iodine Other (See Comments)   Penicillins    Lisinopril Other (See Comments)    Antimicrobials this admission: 5/45 vancomycin >>   Dose adjustments this admission:   Microbiology results: 5/15: surgical wound culture: pending   Thank you for allowing pharmacy to be a part of this patient's care.  Sharen Hones, PharmD, BCPS Clinical Pharmacist   04/11/2023 7:12 PM

## 2023-04-11 NOTE — Anesthesia Postprocedure Evaluation (Signed)
Anesthesia Post Note  Patient: Heather Mcintyre  Procedure(s) Performed: INCISION AND DRAINAGE ABSCESS, breast (Left)  Patient location during evaluation: PACU Anesthesia Type: General Level of consciousness: awake and alert, oriented and patient cooperative Pain management: pain level controlled Vital Signs Assessment: post-procedure vital signs reviewed and stable Respiratory status: spontaneous breathing, nonlabored ventilation and respiratory function stable Cardiovascular status: blood pressure returned to baseline and stable Postop Assessment: adequate PO intake Anesthetic complications: no   No notable events documented.   Last Vitals:  Vitals:   04/11/23 1915 04/11/23 1925  BP: (!) 168/57 (!) 160/84  Pulse: 64 66  Resp: 16 12  Temp:  36.8 C  SpO2: 100% 99%    Last Pain:  Vitals:   04/11/23 1915  TempSrc:   PainSc: 0-No pain                 Reed Breech

## 2023-04-11 NOTE — Interval H&P Note (Signed)
History and Physical Interval Note:  04/11/2023 5:05 PM  Heather Mcintyre  has presented today for surgery, with the diagnosis of left breast abscess.  The various methods of treatment have been discussed with the patient and family. After consideration of risks, benefits and other options for treatment, the patient has consented to  Procedure(s): INCISION AND DRAINAGE ABSCESS, breast (Left) as a surgical intervention.  The patient's history has been reviewed, patient examined, no change in status, stable for surgery.  I have reviewed the patient's chart and labs.  Questions were answered to the patient's satisfaction.     Chosen Geske

## 2023-04-11 NOTE — Progress Notes (Signed)
04/11/2023  History of Present Illness: Heather Mcintyre is a 57 y.o. female s/p left breast lumpectomy and sentinel lymph node biopsy on 11/07/22 for left breast cancer.  She has completed radiation and is currently on Arimidex.  She reports that a couple weeks ago she noticed that the area of the surgery in the left breast was becoming firmer and more swollen.  Since then this has progressed and now there is associated erythema of the skin and induration, and an area of thinning at the incision site which has now spontaneously started to drain purulent fluid.  The patient denies fevers or chills but reports she's been having pain in the left breast more and a lot of drainage that has soaked into her bra.  Past Medical History: Past Medical History:  Diagnosis Date   Anxiety    Asthma    Breast cancer (HCC)    Cervical cancer (HCC)    Complication of anesthesia    woke up in the middle of surgery 2010   Degenerative joint disease/osteoarthritis    Depression    GERD (gastroesophageal reflux disease)    Hypertension    Lupus (HCC)    Pre-diabetes      Past Surgical History: Past Surgical History:  Procedure Laterality Date   ABDOMINAL HYSTERECTOMY     BREAST BIOPSY Left 10/04/2022   [place] LT BREAST BX W LOC DEV 1ST LESION IMG BX SPEC US GUIDE 10/04/2022 [facility]   BREAST LUMPECTOMY,RADIO FREQ LOCALIZER,AXILLARY SENTINEL LYMPH NODE BIOPSY Left 11/07/2022   Procedure: BREAST LUMPECTOMY,RADIO FREQ LOCALIZER,AXILLARY SENTINEL LYMPH NODE BIOPSY;  Surgeon: Henrene Dodge, MD;  Location: [facility];  Service: General;  Laterality: Left;   CHOLECYSTECTOMY N/A    COLONOSCOPY WITH ESOPHAGOGASTRODUODENOSCOPY (EGD)     JOINT REPLACEMENT     LAPAROSCOPIC GASTRIC BAND REMOVAL WITH LAPAROSCOPIC GASTRIC SLEEVE RESECTION     REPLACEMENT TOTAL KNEE BILATERAL     TUBAL LIGATION      Home Medications: Prior to Admission medications   Medication Sig Start Date End Date Taking? Authorizing  Provider  acetaminophen (TYLENOL) 500 MG tablet Take 2 tablets (1,000 mg total) by mouth every 6 (six) hours as needed for mild pain. 11/07/22  Yes Jenette Rayson, MD  ALOE VERA PO Take 1 tablet by mouth daily. Natural aloe   Yes [provider]  anastrozole (ARIMIDEX) 1 MG tablet Take 1 tablet (1 mg total) by mouth daily. 03/28/23  Yes Jeralyn Ruths, MD  ascorbic acid (VITAMIN C) 500 MG tablet Take 500 mg by mouth daily.   Yes [provider]  B COMPLEX VITAMINS PO Take 1 tablet by mouth daily.   Yes [provider]  celecoxib (CELEBREX) 200 MG capsule Take 1 capsule (200 mg total) by mouth 2 (two) times daily as needed for moderate pain. 11/07/22  Yes Devonn Giampietro, Elita Quick, MD  cetirizine (ZYRTEC) 10 MG tablet Take 10 mg by mouth daily.   Yes [provider]  colchicine 0.6 MG tablet Take 1 tablet (0.6 mg total) by mouth daily. 01/11/23 01/11/24 Yes Ward, Layla Maw, DO  diphenhydrAMINE (BENADRYL) 25 mg capsule Take 25 mg by mouth every 6 (six) hours as needed.   Yes [provider]  fluticasone (FLONASE) 50 MCG/ACT nasal spray Place 1 spray into both nostrils daily.   Yes [provider]  furosemide (LASIX) 40 MG tablet Take 40 mg by mouth daily as needed.   Yes [provider]  gabapentin (NEURONTIN) 300 MG capsule Take 300 mg  by mouth at bedtime. 06/28/16  Yes [provider]  hydrOXYzine (ATARAX) 10 MG tablet Take 10 mg by mouth 2 (two) times daily. 09/26/22  Yes [provider]  indomethacin (INDOCIN) 50 MG capsule Take 1 capsule (50 mg total) by mouth 3 (three) times daily with meals. 01/11/23  Yes Ward, Layla Maw, DO  linaclotide (LINZESS) 290 MCG CAPS capsule Take 290 mcg by mouth daily before breakfast.   Yes [provider]  metoprolol succinate (TOPROL-XL) 50 MG 24 hr tablet Take 50 mg by mouth daily. Take with or immediately following a meal.   Yes [provider]  montelukast (SINGULAIR) 10 MG  tablet Take 10 mg by mouth at bedtime.   Yes [provider]  multivitamin-lutein (OCUVITE-LUTEIN) CAPS capsule Take 1 capsule by mouth as needed.   Yes [provider]  omeprazole (PRILOSEC) 40 MG capsule  09/13/17  Yes [provider]  ondansetron (ZOFRAN-ODT) 4 MG disintegrating tablet Take 1 tablet (4 mg total) by mouth every 6 (six) hours as needed for nausea or vomiting. 01/11/23  Yes Ward, Layla Maw, DO  oxyCODONE-acetaminophen (PERCOCET) 5-325 MG tablet Take 2 tablets by mouth every 6 (six) hours as needed for severe pain. 01/11/23 01/11/24 Yes Ward, Layla Maw, DO  tiZANidine (ZANAFLEX) 4 MG tablet Take 4 mg by mouth every 8 (eight) hours as needed for muscle spasms.   Yes [provider]  traZODone (DESYREL) 100 MG tablet Take 100-300 mg by mouth at bedtime.   Yes [provider]    Allergies: Allergies  Allergen Reactions   Cephalexin Hives and Other (See Comments)   Etodolac Hives   Iodinated Contrast Media Hives   Latex Hives and Other (See Comments)   Penicillin G Hives   Fish-Derived Products Other (See Comments)   Iodine Other (See Comments)   Penicillins    Lisinopril Other (See Comments)    Review of Systems: Review of Systems  Constitutional:  Negative for chills and fever.  Respiratory:  Negative for shortness of breath.   Cardiovascular:  Negative for chest pain.  Gastrointestinal:  Negative for abdominal pain, nausea and vomiting.  Skin:        Left breast pain, swelling, and drainage.    Physical Exam BP (!) 177/89   Pulse 63   Temp 98.6 F (37 C)   Ht 5\' 8"  (1.727 m)   Wt 262 lb (118.8 kg)   SpO2 100%   BMI 39.84 kg/m  CONSTITUTIONAL: No acute distress HEENT:  Normocephalic, atraumatic, extraocular motion intact. RESPIRATORY:  Lungs are clear, and breath sounds are equal bilaterally. Normal respiratory effort without pathologic use of accessory muscles. CARDIOVASCULAR: Heart is regular without murmurs,  gallops, or rubs. BREAST:  Left breast s/p lumpectomy of upper outer quadrant.  That incision now has a thin skin covering that is bulging, consistent with fluctuance and abscess.  The surrounding skin of the upper outer quadrant has erythema and some mild induration.  This wound was probed and had immediate purulent fluid drainage.  This was swabbed for cultures.  Gauze dressing applied. NEUROLOGIC:  Motor and sensation is grossly normal.  Cranial nerves are grossly intact. PSYCH:  Alert and oriented to person, place and time. Affect is normal.  Labs/Imaging: Labs from 01/11/23: Na 136, K 3.5, Cl 100, CO2 26, BUN 16, Cr 0.79.  WBC 7, Hgb 12.1, Hct 37.1, Plt 261.  Assessment and Plan: This is a 57 y.o. female with a left breast abscess.  --Discussed with  the patient that she has a left breast abscess.  It is unclear how big the underlying cavity is, but discussed with her that it would be better to do a formal I&D in the OR under anesthesia so the wound can be washed out and packed.  If any debridement is needed, discussed that as well.  I think this would be too tender to do in office.  She's in agreement.  Risks discussed and she's willing to proceed. --She last ate at 10 AM.  Will schedule her for surgery today at 6 pm.  Instructed her to remain NPO for the rest of the day.  Discussed overnight admission for pain control and to teach her packing dressing changes.  Will be on IV antibiotics as well.   --All of her questions have been answered.  I spent 40 minutes dedicated to the care of this patient on the date of this encounter to include pre-visit review of records, face-to-face time with the patient discussing diagnosis and management, and any post-visit coordination of care.   Howie Ill, MD [facility]

## 2023-04-11 NOTE — Transfer of Care (Signed)
Immediate Anesthesia Transfer of Care Note  Patient: Heather Mcintyre  Procedure(s) Performed: INCISION AND DRAINAGE ABSCESS, breast (Left)  Patient Location: PACU  Anesthesia Type:General  Level of Consciousness: awake  Airway & Oxygen Therapy: Patient Spontanous Breathing  Post-op Assessment: Report given to RN and Post -op Vital signs reviewed and stable  Post vital signs: Reviewed  Last Vitals:  Vitals Value Taken Time  BP 168/76 04/11/23 1900  Temp 36.5 C 04/11/23 1858  Pulse 65 04/11/23 1902  Resp 18 04/11/23 1902  SpO2 100 % 04/11/23 1902  Vitals shown include unvalidated device data.  Last Pain:  Vitals:   04/11/23 1534  TempSrc: Oral  PainSc: 0-No pain         Complications: No notable events documented.

## 2023-04-11 NOTE — Patient Instructions (Addendum)
We will send out a culture of your wound to be sure that we place you on the correct antibiotic.  Do not eat any more today. Only water to take your blood pressure medications, than nothing by mouth.  We will call you and let you know your arrival time for the hospital for your surgery.

## 2023-04-11 NOTE — H&P (View-Only) (Signed)
04/11/2023  History of Present Illness: Heather Mcintyre is a 56 y.o. female s/p left breast lumpectomy and sentinel lymph node biopsy on 11/07/22 for left breast cancer.  She has completed radiation and is currently on Arimidex.  She reports that a couple weeks ago she noticed that the area of the surgery in the left breast was becoming firmer and more swollen.  Since then this has progressed and now there is associated erythema of the skin and induration, and an area of thinning at the incision site which has now spontaneously started to drain purulent fluid.  The patient denies fevers or chills but reports she's been having pain in the left breast more and a lot of drainage that has soaked into her bra.  Past Medical History: Past Medical History:  Diagnosis Date   Anxiety    Asthma    Breast cancer (HCC)    Cervical cancer (HCC)    Complication of anesthesia    woke up in the middle of surgery 2010   Degenerative joint disease/osteoarthritis    Depression    GERD (gastroesophageal reflux disease)    Hypertension    Lupus (HCC)    Pre-diabetes      Past Surgical History: Past Surgical History:  Procedure Laterality Date   ABDOMINAL HYSTERECTOMY     BREAST BIOPSY Left 10/04/2022   US LT BREAST BX W LOC DEV 1ST LESION IMG BX SPEC US GUIDE 10/04/2022 [facility]   BREAST LUMPECTOMY,RADIO FREQ LOCALIZER,AXILLARY SENTINEL LYMPH NODE BIOPSY Left 11/07/2022   Procedure: BREAST LUMPECTOMY,RADIO FREQ LOCALIZER,AXILLARY SENTINEL LYMPH NODE BIOPSY;  Surgeon: Jermine Bibbee, MD;  Location: [facility];  Service: General;  Laterality: Left;   CHOLECYSTECTOMY N/A    COLONOSCOPY WITH ESOPHAGOGASTRODUODENOSCOPY (EGD)     JOINT REPLACEMENT     LAPAROSCOPIC GASTRIC BAND REMOVAL WITH LAPAROSCOPIC GASTRIC SLEEVE RESECTION     REPLACEMENT TOTAL KNEE BILATERAL     TUBAL LIGATION      Home Medications: Prior to Admission medications   Medication Sig Start Date End Date Taking? Authorizing  Provider  acetaminophen (TYLENOL) 500 MG tablet Take 2 tablets (1,000 mg total) by mouth every 6 (six) hours as needed for mild pain. 11/07/22  Yes Caitlyn Buchanan, MD  ALOE VERA PO Take 1 tablet by mouth daily. Natural aloe   Yes [provider]  anastrozole (ARIMIDEX) 1 MG tablet Take 1 tablet (1 mg total) by mouth daily. 03/28/23  Yes Finnegan, Timothy J, MD  ascorbic acid (VITAMIN C) 500 MG tablet Take 500 mg by mouth daily.   Yes [provider]  B COMPLEX VITAMINS PO Take 1 tablet by mouth daily.   Yes [provider]  celecoxib (CELEBREX) 200 MG capsule Take 1 capsule (200 mg total) by mouth 2 (two) times daily as needed for moderate pain. 11/07/22  Yes Zionah Criswell, MD  cetirizine (ZYRTEC) 10 MG tablet Take 10 mg by mouth daily.   Yes [provider]  colchicine 0.6 MG tablet Take 1 tablet (0.6 mg total) by mouth daily. 01/11/23 01/11/24 Yes Ward, Kristen N, DO  diphenhydrAMINE (BENADRYL) 25 mg capsule Take 25 mg by mouth every 6 (six) hours as needed.   Yes [provider]  fluticasone (FLONASE) 50 MCG/ACT nasal spray Place 1 spray into both nostrils daily.   Yes [provider]  furosemide (LASIX) 40 MG tablet Take 40 mg by mouth daily as needed.   Yes [provider]  gabapentin (NEURONTIN) 300 MG capsule Take 300 mg   by mouth at bedtime. 06/28/16  Yes [provider]  hydrOXYzine (ATARAX) 10 MG tablet Take 10 mg by mouth 2 (two) times daily. 09/26/22  Yes [provider]  indomethacin (INDOCIN) 50 MG capsule Take 1 capsule (50 mg total) by mouth 3 (three) times daily with meals. 01/11/23  Yes Ward, Kristen N, DO  linaclotide (LINZESS) 290 MCG CAPS capsule Take 290 mcg by mouth daily before breakfast.   Yes [provider]  metoprolol succinate (TOPROL-XL) 50 MG 24 hr tablet Take 50 mg by mouth daily. Take with or immediately following a meal.   Yes [provider]  montelukast (SINGULAIR) 10 MG  tablet Take 10 mg by mouth at bedtime.   Yes [provider]  multivitamin-lutein (OCUVITE-LUTEIN) CAPS capsule Take 1 capsule by mouth as needed.   Yes [provider]  omeprazole (PRILOSEC) 40 MG capsule  09/13/17  Yes [provider]  ondansetron (ZOFRAN-ODT) 4 MG disintegrating tablet Take 1 tablet (4 mg total) by mouth every 6 (six) hours as needed for nausea or vomiting. 01/11/23  Yes Ward, Kristen N, DO  oxyCODONE-acetaminophen (PERCOCET) 5-325 MG tablet Take 2 tablets by mouth every 6 (six) hours as needed for severe pain. 01/11/23 01/11/24 Yes Ward, Kristen N, DO  tiZANidine (ZANAFLEX) 4 MG tablet Take 4 mg by mouth every 8 (eight) hours as needed for muscle spasms.   Yes [provider]  traZODone (DESYREL) 100 MG tablet Take 100-300 mg by mouth at bedtime.   Yes [provider]    Allergies: Allergies  Allergen Reactions   Cephalexin Hives and Other (See Comments)   Etodolac Hives   Iodinated Contrast Media Hives   Latex Hives and Other (See Comments)   Penicillin G Hives   Fish-Derived Products Other (See Comments)   Iodine Other (See Comments)   Penicillins    Lisinopril Other (See Comments)    Review of Systems: Review of Systems  Constitutional:  Negative for chills and fever.  Respiratory:  Negative for shortness of breath.   Cardiovascular:  Negative for chest pain.  Gastrointestinal:  Negative for abdominal pain, nausea and vomiting.  Skin:        Left breast pain, swelling, and drainage.    Physical Exam BP (!) 177/89   Pulse 63   Temp 98.6 F (37 C)   Ht 5' 8" (1.727 m)   Wt 262 lb (118.8 kg)   SpO2 100%   BMI 39.84 kg/m  CONSTITUTIONAL: No acute distress HEENT:  Normocephalic, atraumatic, extraocular motion intact. RESPIRATORY:  Lungs are clear, and breath sounds are equal bilaterally. Normal respiratory effort without pathologic use of accessory muscles. CARDIOVASCULAR: Heart is regular without murmurs,  gallops, or rubs. BREAST:  Left breast s/p lumpectomy of upper outer quadrant.  That incision now has a thin skin covering that is bulging, consistent with fluctuance and abscess.  The surrounding skin of the upper outer quadrant has erythema and some mild induration.  This wound was probed and had immediate purulent fluid drainage.  This was swabbed for cultures.  Gauze dressing applied. NEUROLOGIC:  Motor and sensation is grossly normal.  Cranial nerves are grossly intact. PSYCH:  Alert and oriented to person, place and time. Affect is normal.  Labs/Imaging: Labs from 01/11/23: Na 136, K 3.5, Cl 100, CO2 26, BUN 16, Cr 0.79.  WBC 7, Hgb 12.1, Hct 37.1, Plt 261.  Assessment and Plan: This is a 56 y.o. female with a left breast abscess.  --Discussed with   the patient that she has a left breast abscess.  It is unclear how big the underlying cavity is, but discussed with her that it would be better to do a formal I&D in the OR under anesthesia so the wound can be washed out and packed.  If any debridement is needed, discussed that as well.  I think this would be too tender to do in office.  She's in agreement.  Risks discussed and she's willing to proceed. --She last ate at 10 AM.  Will schedule her for surgery today at 6 pm.  Instructed her to remain NPO for the rest of the day.  Discussed overnight admission for pain control and to teach her packing dressing changes.  Will be on IV antibiotics as well.   --All of her questions have been answered.  I spent 40 minutes dedicated to the care of this patient on the date of this encounter to include pre-visit review of records, face-to-face time with the patient discussing diagnosis and management, and any post-visit coordination of care.   Elizette Shek Luis Jaques Mineer, MD [facility]     

## 2023-04-11 NOTE — Telephone Encounter (Signed)
Patient has been advised of Pre-Admission date/time, and Surgery date at [facility].  Surgery Date: 04/11/23 this was add on for same day Preadmission Testing Date: 04/11/23, patient to arrive @ 4:00 pm.  Patient is called and made aware of this.  She is reminded not to eat or drink anything.  Patient verbalized understanding.

## 2023-04-11 NOTE — Anesthesia Procedure Notes (Signed)
Procedure Name: Intubation Date/Time: 04/11/2023 6:12 PM  Performed by: Merlene Pulling, CRNAPre-anesthesia Checklist: Patient identified, Patient being monitored, Timeout performed, Emergency Drugs available and Suction available Patient Re-evaluated:Patient Re-evaluated prior to induction Oxygen Delivery Method: Circle system utilized Preoxygenation: Pre-oxygenation with 100% oxygen Induction Type: IV induction Ventilation: Mask ventilation without difficulty Laryngoscope Size: Mac and 3 Grade View: Grade II Tube type: Oral Tube size: 7.0 mm Number of attempts: 1 Airway Equipment and Method: Stylet Placement Confirmation: ETT inserted through vocal cords under direct vision, positive ETCO2 and breath sounds checked- equal and bilateral Secured at: 24 cm Tube secured with: Tape Dental Injury: Teeth and Oropharynx as per pre-operative assessment

## 2023-04-11 NOTE — Anesthesia Preprocedure Evaluation (Addendum)
Anesthesia Evaluation  Patient identified by MRN, date of birth, ID band Patient awake    Reviewed: Allergy & Precautions, NPO status , Patient's Chart, lab work & pertinent test results  History of Anesthesia Complications (+) AWARENESS UNDER ANESTHESIA and history of anesthetic complications  Airway Mallampati: III   Neck ROM: Full    Dental  (+) Partial Upper, Missing   Pulmonary asthma    Pulmonary exam normal breath sounds clear to auscultation       Cardiovascular hypertension, Normal cardiovascular exam Rhythm:Regular Rate:Normal  ECG 01/15/23: Sinus bradycardia (HR 54), otherwise normal   Neuro/Psych  PSYCHIATRIC DISORDERS Anxiety Depression    negative neurological ROS     GI/Hepatic ,GERD  ,,  Endo/Other  Prediabetes; class 3 obesity  Renal/GU negative Renal ROS     Musculoskeletal  (+) Arthritis ,  Gout    Abdominal   Peds  Hematology Lupus; breast CA; cervical CA   Anesthesia Other Findings Cardiology note 12/13/22:  Cardiology Problems    Essential hypertension (Chronic)      Blood pressure looks well-controlled today she is taking 50 mg Toprol which could explain her bradycardia.  She is also taking Lasix as needed maybe once or twice a week.      Other   Generalized edema      Not really associated PND or orthopnea.  Nothing does really suggest CHF.  She takes PRN Lasix once twice a week.     Bradycardia following surgery - Primary      She had an EKG that showed the heart rate of 49 bpm and apparently has had lower than usual heart rate.  She is on moderate dose Toprol which could be the reason.   Plan: 7-day ZIO patch monitor to determine heart rate range.     Morbid obesity with BMI of 40.0-44.9, adult (HCC) (Chronic)      I was wondering if maybe some of her fatigue issues could be related to OSA, the ceiling is more related to poor sleep because of pain and nocturia.   Certainly weight  loss will help most of her musculoskeletal pain issues.   Discussed diet and exercise recommendations.    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  This is injection with and letrozole   Will discussed with Cardio Oncology-Dr. Carolan Clines.  Will determine whether or not she needs reevaluation with 2D echo.   Reproductive/Obstetrics                             Anesthesia Physical Anesthesia Plan  ASA: 3  Anesthesia Plan: General   Post-op Pain Management:    Induction: Intravenous  PONV Risk Score and Plan: 3 and Ondansetron, Dexamethasone and Treatment may vary due to age or medical condition  Airway Management Planned: Oral ETT  Additional Equipment:   Intra-op Plan:   Post-operative Plan: Extubation in OR  Informed Consent: I have reviewed the patients History and Physical, chart, labs and discussed the procedure including the risks, benefits and alternatives for the proposed anesthesia with the patient or authorized representative who has indicated his/her understanding and acceptance.     Dental advisory given  Plan Discussed with: CRNA  Anesthesia Plan Comments: (Patient consented for risks of anesthesia including but not limited to:  - adverse reactions to medications - damage to eyes, teeth, lips or  other oral mucosa - nerve damage due to positioning  - sore throat or hoarseness - damage to heart, brain, nerves, lungs, other parts of body or loss of life  Informed patient about role of CRNA in peri- and intra-operative care.  Patient voiced understanding.)        Anesthesia Quick Evaluation

## 2023-04-11 NOTE — Op Note (Signed)
  Procedure Date:  04/11/2023  Pre-operative Diagnosis:  Left breast abscess  Post-operative Diagnosis: Left breast abscess  Procedure:  Incision and Drainage of left breast abscess  Surgeon:  Howie Ill, MD  Anesthesia:  General endotracheal  Estimated Blood Loss:  5 ml  Specimens:   Left breast culture swab Left breast fluid culture  Complications:  None  Indications for Procedure:  This is a 57 y.o. female with diagnosis of left breast abscess, requiring drainage procedure.  She's s/p left breast lumpectomy with sentinel lymph node biopsy in December 2023 for left breast cancer.  The abscess is in the location of her prior lumpectomy site in the upper outer quadrant.  The risks of bleeding, abscess or infection, injury to surrounding structures, and need for further procedures were all discussed with the patient and was willing to proceed.  Description of Procedure: The patient was correctly identified in the preoperative area and brought into the operating room.  The patient was placed supine with VTE prophylaxis in place.  Appropriate time-outs were performed.  Anesthesia was induced and the patient was intubated.  Appropriate antibiotics were infused.  The patient's left breast was prepped and draped in usual sterile fashion.  The patient already had an open wound that measured about 1 cm in size.  Culture swab of the purulent fluid was inserted and fluid swab obtained.  Then, a syringe was used to aspirate the rest of the fluid and also sent for cultures.  The incision was extended to exclude the patient's ulcerated skin, making it an elliptical 3 cm incision.  Yankauer was used to suction any remaining fluid.  The abscess cavity was about 4-5 cm in size.  The wound bed was healthy without any necrosis.  The cavity was thoroughly irrigated.  Local anesthetic was infused intradermally.  The wound was packed with wet to dry 4x4 gauze and covered with dry gauze, ABD pad, and  tape.  The patient was then emerged from anesthesia, extubated, and brought to the recovery room for further management.  The patient tolerated the procedure well and all counts were correct at the end of the case.   Howie Ill, MD

## 2023-04-12 ENCOUNTER — Encounter: Payer: Self-pay | Admitting: Surgery

## 2023-04-12 DIAGNOSIS — N611 Abscess of the breast and nipple: Secondary | ICD-10-CM | POA: Diagnosis not present

## 2023-04-12 LAB — AEROBIC/ANAEROBIC CULTURE W GRAM STAIN (SURGICAL/DEEP WOUND)

## 2023-04-12 MED ORDER — VANCOMYCIN HCL IN DEXTROSE 1-5 GM/200ML-% IV SOLN
INTRAVENOUS | Status: AC
  Filled 2023-04-12: qty 200

## 2023-04-12 MED ORDER — ACETAMINOPHEN 500 MG PO TABS
ORAL_TABLET | ORAL | Status: AC
  Filled 2023-04-12: qty 2

## 2023-04-12 MED ORDER — OXYCODONE HCL 5 MG PO TABS
ORAL_TABLET | ORAL | Status: AC
  Filled 2023-04-12: qty 1

## 2023-04-12 MED ORDER — OXYCODONE HCL 5 MG PO TABS
5.0000 mg | ORAL_TABLET | Freq: Four times a day (QID) | ORAL | 0 refills | Status: DC | PRN
Start: 2023-04-12 — End: 2023-04-25

## 2023-04-12 MED ORDER — SULFAMETHOXAZOLE-TRIMETHOPRIM 800-160 MG PO TABS: 1.0000 | ORAL_TABLET | Freq: Two times a day (BID) | ORAL | 0 refills | Status: DC

## 2023-04-12 NOTE — TOC Progression Note (Signed)
Transition of Care [facility] - Progression Note    Patient Details  Name: Heather Mcintyre MRN: [id number] Date of Birth: 11-Jul-1966  Transition of Care [facility]) CM/SW Contact  Marlowe Sax, RN Phone Number: 04/12/2023, 9:38 AM  Clinical Narrative:   Reached out to [place] at [facility] to set up [facility] RN for wound care The patient or other person will need to be taught how to do the dressing change and wound care as [facility] RN will not come out to the home daily, the patient reports that she doe snot need DME    Expected Discharge Plan: Home w Home Health Services Barriers to Discharge: No Barriers Identified  Expected Discharge Plan and Services   Discharge Planning Services: CM Consult   Living arrangements for the past 2 months: Single Family Home                 DME Arranged: N/A DME Agency: NA       HH Arranged: RN HH Agency: [facility] Date HH Agency Contacted: 04/12/23 Time HH Agency Contacted: 0935 Representative spoke with at [facility] Agency: [place]   Social Determinants of Health (SDOH) Interventions SDOH Screenings   Food Insecurity: No Food Insecurity (04/11/2023)  Housing: Low Risk  (04/11/2023)  Transportation Needs: No Transportation Needs (04/11/2023)  Utilities: Not At Risk (04/11/2023)  Tobacco Use: Low Risk  (04/12/2023)    Readmission Risk Interventions     No data to display

## 2023-04-12 NOTE — Progress Notes (Signed)
Pts HR in the 50's, scheduled Metoprolol held, MD Piscoya notified and made aware, no orders received.

## 2023-04-12 NOTE — Discharge Instructions (Addendum)
Discharge instructions: 1.  Patient may shower or do sponge baths, but do not scrub wound heavily and dab dry only. 2.  Do not submerge wound in pool/tub until fully healed. 3.  May apply ice packs to the wound for comfort. 4.  May wear a sports bra to help with support/comfort while the wound is healing 5.  Please change your dressing twice daily until 04/14/23, then can change once daily starting 04/15/23.    Dressing change: Moisten 4x4 gauze with saline and pack it into the wound using tweezers or qtip.  Then apply dry 4x4 gauze and cover with ABD pad and secure with tape.  Change twice daily through 5/18 and once daily starting 5/19.  The outer gauze can be changed as needed to keep the area clean/dry.  The packing only needs twice daily or once daily change.

## 2023-04-12 NOTE — Plan of Care (Signed)
  Problem: Activity: Goal: Risk for activity intolerance will decrease Outcome: Progressing   Problem: Pain Managment: Goal: General experience of comfort will improve Outcome: Progressing   Problem: Safety: Goal: Ability to remain free from injury will improve Outcome: Progressing   

## 2023-04-12 NOTE — Discharge Summary (Signed)
Patient ID: Heather Mcintyre MRN: [id number] DOB/AGE: [birth year] 58 y.o.  Admit date: 04/11/2023 Discharge date: 04/12/2023   Discharge Diagnoses:  Principal Problem:   Left breast abscess   Procedures:  Incision and drainage of left breast abscess  Hospital Course: Patient was admitted on 04/11/23 after undergoing an I&D of left breast abscess.  Post-operatively the patient did well.  Her diet was advanced without issues, she was voiding, pain was well controlled, and the erythema in her left breast was improving.  Cultures currently pending but will discharge with Bactrim DS course.  Dressing change done this morning shows no purulence, healthy wound bed, no necrosis.  Have arranged for home health for dressing changes and further teaching.  Will discharge home today and follow up in 1 week.  Consults: None  Disposition: Discharge disposition: 01-Home or Self Care       Discharge Instructions     Call MD for:  difficulty breathing, headache or visual disturbances   Complete by: As directed    Call MD for:  persistant nausea and vomiting   Complete by: As directed    Call MD for:  redness, tenderness, or signs of infection (pain, swelling, redness, odor or green/yellow discharge around incision site)   Complete by: As directed    Call MD for:  severe uncontrolled pain   Complete by: As directed    Call MD for:  temperature >100.4   Complete by: As directed    Change dressing (specify)   Complete by: As directed    Dressing change:  moisten 4x4 gauze with saline and pack it into the wound using tweezers or qtip.  Then apply dry 4x4 gauze and cover with ABD pad and secure with tape.  Change twice daily through 5/18 and once daily starting 5/19.  The outer gauze can be changed as needed to keep the area clean/dry.  The packing only needs twice daily or once daily change.   Diet - low sodium heart healthy   Complete by: As directed    Discharge instructions   Complete by: As  directed    1.  Patient may shower or do sponge baths, but do not scrub wound heavily and dab dry only. 2.  Do not submerge wound in pool/tub until fully healed. 3.  May apply ice packs to the wound for comfort. 4.  May wear a sports bra to help with support/comfort while the wound is healing 5.  Please change your dressing twice daily until 04/14/23, then can change once daily starting 04/15/23.   Driving Restrictions   Complete by: As directed    Do not drive while taking narcotics for pain control.  Prior to driving, make sure you are able to rotate right and left to look at blindspots without significant pain or discomfort.   Increase activity slowly   Complete by: As directed    Lifting restrictions   Complete by: As directed    Avoid strenuous activity with the left arm for 1 week to help with comfort.      Allergies as of 04/12/2023       Reactions   Cephalexin Hives, Other (See Comments)   Etodolac Hives   Iodinated Contrast Media Hives   Latex Hives, Other (See Comments)   Penicillin G Hives   Fish-derived Products Other (See Comments)   Iodine Other (See Comments)   Penicillins    Lisinopril Other (See Comments)        Medication List  STOP taking these medications    oxyCODONE-acetaminophen 5-325 MG tablet Commonly known as: Percocet       TAKE these medications    acetaminophen 500 MG tablet Commonly known as: TYLENOL Take 2 tablets (1,000 mg total) by mouth every 6 (six) hours as needed for mild pain.   ALOE VERA PO Take 1 tablet by mouth daily. Natural aloe   anastrozole 1 MG tablet Commonly known as: ARIMIDEX Take 1 tablet (1 mg total) by mouth daily.   ascorbic acid 500 MG tablet Commonly known as: VITAMIN C Take 500 mg by mouth daily.   B COMPLEX VITAMINS PO Take 1 tablet by mouth daily.   celecoxib 200 MG capsule Commonly known as: CeleBREX Take 1 capsule (200 mg total) by mouth 2 (two) times daily as needed for moderate pain.    cetirizine 10 MG tablet Commonly known as: ZYRTEC Take 10 mg by mouth daily.   colchicine 0.6 MG tablet Take 1 tablet (0.6 mg total) by mouth daily.   diphenhydrAMINE 25 mg capsule Commonly known as: BENADRYL Take 25 mg by mouth every 6 (six) hours as needed.   fluticasone 50 MCG/ACT nasal spray Commonly known as: FLONASE Place 1 spray into both nostrils daily.   furosemide 40 MG tablet Commonly known as: LASIX Take 40 mg by mouth daily as needed.   gabapentin 300 MG capsule Commonly known as: NEURONTIN Take 300 mg by mouth at bedtime.   hydrOXYzine 10 MG tablet Commonly known as: ATARAX Take 10 mg by mouth 2 (two) times daily.   indomethacin 50 MG capsule Commonly known as: INDOCIN Take 1 capsule (50 mg total) by mouth 3 (three) times daily with meals.   linaclotide 290 MCG Caps capsule Commonly known as: LINZESS Take 290 mcg by mouth daily before breakfast.   metoprolol succinate 50 MG 24 hr tablet Commonly known as: TOPROL-XL Take 50 mg by mouth daily. Take with or immediately following a meal.   montelukast 10 MG tablet Commonly known as: SINGULAIR Take 10 mg by mouth at bedtime.   multivitamin-lutein Caps capsule Take 1 capsule by mouth as needed.   omeprazole 40 MG capsule Commonly known as: PRILOSEC   ondansetron 4 MG disintegrating tablet Commonly known as: ZOFRAN-ODT Take 1 tablet (4 mg total) by mouth every 6 (six) hours as needed for nausea or vomiting.   oxyCODONE 5 MG immediate release tablet Commonly known as: Oxy IR/ROXICODONE Take 1 tablet (5 mg total) by mouth every 6 (six) hours as needed for severe pain.   sulfamethoxazole-trimethoprim 800-160 MG tablet Commonly known as: BACTRIM DS Take 1 tablet by mouth 2 (two) times daily.   tiZANidine 4 MG tablet Commonly known as: ZANAFLEX Take 4 mg by mouth every 8 (eight) hours as needed for muscle spasms.   traZODone 100 MG tablet Commonly known as: DESYREL Take 100-300 mg by mouth at  bedtime.               Discharge Care Instructions  (From admission, onward)           Start     Ordered   04/12/23 0000  Change dressing (specify)       Comments: Dressing change:  moisten 4x4 gauze with saline and pack it into the wound using tweezers or qtip.  Then apply dry 4x4 gauze and cover with ABD pad and secure with tape.  Change twice daily through 5/18 and once daily starting 5/19.  The outer gauze can be changed as needed  to keep the area clean/dry.  The packing only needs twice daily or once daily change.   04/12/23 1244            Follow-up Information     Hanifah Royse, Elita Quick, MD Follow up in 1 week(s).   Specialty: General Surgery Contact information: [address] [place] [address] [phone]

## 2023-04-12 NOTE — Progress Notes (Signed)
DISCHARGE NOTE:  Pt given discharge instructions and verbalized understanding. Surgical dressing clean dry intact. Pt wheeled to car by staff, Son providing transportation.

## 2023-04-13 LAB — AEROBIC/ANAEROBIC CULTURE W GRAM STAIN (SURGICAL/DEEP WOUND)

## 2023-04-13 LAB — HIV ANTIBODY (ROUTINE TESTING W REFLEX): HIV Screen 4th Generation wRfx: NONREACTIVE

## 2023-04-14 LAB — AEROBIC/ANAEROBIC CULTURE W GRAM STAIN (SURGICAL/DEEP WOUND)

## 2023-04-17 LAB — ANAEROBIC AND AEROBIC CULTURE

## 2023-04-17 LAB — AEROBIC/ANAEROBIC CULTURE W GRAM STAIN (SURGICAL/DEEP WOUND)

## 2023-04-25 ENCOUNTER — Ambulatory Visit (INDEPENDENT_AMBULATORY_CARE_PROVIDER_SITE_OTHER): Payer: Medicare HMO | Admitting: Surgery

## 2023-04-25 ENCOUNTER — Encounter: Payer: Self-pay | Admitting: Surgery

## 2023-04-25 VITALS — BP 178/102 | HR 65 | Temp 98.2°F | Ht 68.0 in | Wt 264.0 lb

## 2023-04-25 DIAGNOSIS — Z09 Encounter for follow-up examination after completed treatment for conditions other than malignant neoplasm: Secondary | ICD-10-CM

## 2023-04-25 DIAGNOSIS — N611 Abscess of the breast and nipple: Secondary | ICD-10-CM

## 2023-04-25 NOTE — Progress Notes (Signed)
04/25/2023  HPI: Heather Mcintyre is a 57 y.o. female s/p I&D of left breast abscess on 04/11/23.  She had prior left breast cancer s/p lumpectomy and radiation.  Her abscess was in the lumpectomy cavity.  Today she presents for follow up.  Has been doing well and denies any worsening pain in the left breast.  She's doing daily dressing changes and notices still some drainage from the wound.  Vital signs: BP (!) 178/102   Pulse 65   Temp 98.2 F (36.8 C) (Oral)   Ht 5\' 8"  (1.727 m)   Wt 264 lb (119.7 kg)   SpO2 99%   BMI 40.14 kg/m    Physical Exam: Constitutional:  No acute distress Breast:  Left breast wound is getting smaller compared to surgery date.  The wound bed is healthy with good granulation tissue.  No necrosis or fibrinous material.  Wound packed with wet to dry gauze.  Assessment/Plan: This is a 57 y.o. female s/p I&D of left breast abscess.  --Patient is healing well, and her wound is getting smaller without any evidence of complication or ongoing infection.  Continue daily wet to dry dressing changes as currently doing. --Patient is asking if she can stop taking her anastrozole because of side effects.  Recommended that she discuss with Dr. Orlie Dakin, as this medication helps with decreasing the risk of breast cancer. --Follow up in 1 month.   Howie Ill, MD [facility]

## 2023-04-25 NOTE — Patient Instructions (Signed)
If you have any concerns or questions, please feel free to call our office. See follow up appointment.   Wound Packing Wound packing usually involves placing a moistened packing material into your wound and then covering it with an outer bandage (dressing). This helps support the healing of deep tissue and tissue under the skin. It also helps prevent bleeding, infection, and further injury. Wounds are packed until deep tissues heal. The time it takes for this to happen is different for everyone. Your health care provider will show you how to pack and dress your wound. Using gloves and a clean technique is important to avoid spreading germs into your wound. Supplies needed: Soap and water. Disposable gloves. Cleansing or wetting solution, such as saline, germ-free (sterile) water, or an antiseptic solution. Clean bowl. Clean packing material, such as gauze, gauze sponges, or rolled gauze. Clean paper towels. Outer dressing. This includes the cover dressing and tape, or a dressing with an adhesive border. Cotton-tipped swabs. Small plastic bag for trash. How to pack your wound Follow your health care provider's instructions on how often you need to change dressings and pack your wound. You will likely be asked to change your dressings 1 to 2 times a day. Preparing to change the wound packing If needed, take pain medicine 30 minutes before you pack your wound as told by your health care provider. Preparing the new packing material  Clean and disinfect your work surface or countertop. Set a plastic bag on or near your work surface. Wash your hands with soap and water for at least 20 seconds before you change the dressing. If soap and water are not available, use hand sanitizer. Put a clean paper towel on the counter. Put a clean bowl on the towel. Only touch the outside of the bowl when handling it. Pour the cleansing or wetting solution that your health care provider tells you to use into the  bowl. Select and cut your packing material to fit the size of your wound. Avoid using multiple pieces of packing material. Drop it into the bowl. Cut tape strips that you will use to seal the outer dressing, if needed. Put gauze pads for cleansing and cotton-tipped swabs on the clean paper towel. Removing the old packing material and dressing Put on a set of gloves. Gently remove the old dressing and packing material. Make sure to check how the drainage looks or if there is any odor. Clean or rinse (irrigate) the wound. Remove your gloves. Put the removed items, including gloves, into the plastic bag to throw away later. Wash your hands again with soap and water for at least 20 seconds. If soap and water are not available, use hand sanitizer. Applying the new packing material and dressing  Put on a new set of gloves. Squeeze the packing material in the bowl to release the extra liquid. The packing material should be moist, but not dripping wet. Gently place the packing material into the wound. Use a cotton-tipped swab to guide it into place, filling all of the space. Do not overpack the wound bed. Dry your gloved fingertips on the paper towel. Open up your outer dressing supplies and put them on a dry part of the paper towel. Keep them from getting wet. Place the outer dressing over the packed wound. Tape the edges of the outer dressing in place. Remove your gloves. Wash your hands again with soap and water for at least 20 seconds. If soap and water are not available, use  hand sanitizer. Put the removed items, including gloves, into the plastic bag to throw away. Clean and disinfect your work surface or countertop. General tips Follow your health care provider's instructions on how much to pack the wound. At first, you may need to pack it more fully to help stop bleeding. As the wound begins to heal inside, you will use less packing material and pack the wound loosely to allow the tissue to  heal slowly from the inside out. Do not take baths, swim, or use a hot tub until your health care provider approves. Ask your health care provider if you may take showers. You may only be allowed to take sponge baths. Keep the dressing clean and dry. Follow any other instructions given by your health care provider on how to aid healing. This may include applying warm or cold compresses, raising (elevating) the affected area, or wearing a compression dressing. Check your wound site every day for signs of infection. Check for: More redness, swelling, or pain. More fluid or blood. Warmth or hardness (induration). Pus or a bad smell. Protect your wound from the sun when you are outside for the first 6 months, or for as long as told by your health care provider. Cover up the scar area or apply sunscreen that has an SPF of at least 30. Keep all follow-up visits. This is important. Contact a health care provider if: Your pain is not controlled with pain medicine. You have more drainage, redness, swelling, or pain at your wound site. You have new rash, warmth, or induration around the wound. You have a fever or chills. Your wound becomes larger or deeper. Get help right away if: The tissue inside your wound changes color from pink to white, yellow, or black. You notice a bad smell or pus coming from the wound site. You are having trouble packing your wound. Your wound is bleeding, and the bleeding does not stop with gentle pressure. These symptoms may represent a serious problem that is an emergency. Do not wait to see if the symptoms will go away. Get medical help right away. Call your local emergency services (911 in the [place] Do not drive yourself to the hospital. Summary Wound packing usually involves placing a moistened packing material into your wound and then covering it with an outer bandage (dressing). Follow your health care provider's instructions on how often you need to change  dressings and pack your wound. You will likely be asked to change dressings 1 to 2 times a day. When packing your wound, it is important to use gloves to avoid spreading germs into the wound. Check your wound site every day for signs of infection. This information is not intended to replace advice given to you by your health care provider. Make sure you discuss any questions you have with your health care provider. Document Revised: 03/22/2021 Document Reviewed: 03/22/2021 Elsevier Patient Education  2024 [employer].

## 2023-05-03 ENCOUNTER — Encounter: Payer: Self-pay | Admitting: Cardiology

## 2023-05-03 ENCOUNTER — Ambulatory Visit: Payer: Medicare HMO | Attending: Cardiology | Admitting: Cardiology

## 2023-05-03 VITALS — BP 159/89 | HR 51 | Ht 68.0 in | Wt 265.4 lb

## 2023-05-03 DIAGNOSIS — I9789 Other postprocedural complications and disorders of the circulatory system, not elsewhere classified: Secondary | ICD-10-CM | POA: Diagnosis not present

## 2023-05-03 DIAGNOSIS — Z6841 Body Mass Index (BMI) 40.0 and over, adult: Secondary | ICD-10-CM

## 2023-05-03 DIAGNOSIS — I10 Essential (primary) hypertension: Secondary | ICD-10-CM | POA: Diagnosis not present

## 2023-05-03 MED ORDER — VALSARTAN-HYDROCHLOROTHIAZIDE 80-12.5 MG PO TABS: 0.5000 | ORAL_TABLET | Freq: Every day | ORAL | 3 refills | Status: DC

## 2023-05-03 MED ORDER — METOPROLOL SUCCINATE ER 25 MG PO TB24: 25.0000 mg | ORAL_TABLET | Freq: Every day | ORAL | 3 refills | Status: DC

## 2023-05-03 NOTE — Progress Notes (Signed)
Primary Care Provider: Emogene Morgan, MD [facility] Cardiologist: Bryan Lemma, MD Electrophysiologist: None  Clinic Note: Chief Complaint  Patient presents with   Follow-up    2 month follow up visit. Patient states that she is doing ok on today. Meds reviewed.    Bradycardia    No mention of postoperative cardio after I&D.    ===================================  ASSESSMENT/PLAN   Problem List Items Addressed This Visit       Cardiology Problems   Essential hypertension (Chronic)    BP high today.  Also bradycardic. Plan: Reduce Toprol to 25 mg daily to avoid excess bradycardia.  Add valsartan and 40-12.5 mg daily. Follow-up BMP in 2 weeks. BP follow-up in 1 month with either me or APP.      Relevant Medications   valsartan-hydrochlorothiazide (DIOVAN HCT) 80-12.5 MG tablet   metoprolol succinate (TOPROL XL) 25 MG 24 hr tablet   Other Relevant Orders   EKG 12-Lead (Completed)   Basic Metabolic Panel (BMET)     Other   Morbid obesity with BMI of 40.0-44.9, adult (HCC) (Chronic)    Discussed portance of diet and exercise for weight loss.      Bradycardia following surgery - Primary (Chronic)    Based on concerns of bradycardia and recurrent sinus bradycardia today, we will reduce Toprol to 25 mg daily.  In doing so we will increase BP control by adding valsartan-HCTZ.      Relevant Orders   EKG 12-Lead (Completed)   Basic Metabolic Panel (BMET)   ===================================  HPI:    Heather Mcintyre is a 57 y.o. female with a PMH below who presents today for 77-month follow-up.  PMH notable for HTN, DM-2, OA/DJD-knees (SLE/RA), L Br CA & h/o Cerivical CA who is being seen today for the evaluation of OCCASIONAL SLOW HR WITH PROCEDURES, OCCASIONAL CHEST PAIN   Heather Mcintyre was last seen on 12/13/2022 per patient request after episodes of pressure-like sensations off and on the chest.  Usually occurring with rest.  Not exertional.  No  associated symptoms of dyspnea.  Occasionally associate with irregular heartbeats.  Most notable concern was low blood heart rates noted during procedures. She did not really give me much more details on that, but states that that her heart rate is gone down to the 40s presumably during some of her recent procedures.  She is denies any lightheadedness dizziness associated with that nor has she realized any sensation of irregular heartbeats or slow heartbeats.  She denies any syncope or near syncope.  No TIA or amaurosis fugax. No routine exercise related dyspnea or chest discomfort.  She denied any PND, orthopnea with trivial end of day swelling.   Zio patch ordered, but she did not use it.  Recent Hospitalizations:  ER visit February 15 for gout. Admitted 04/11/2023 for I&D of breast abscess.  Reviewed  CV studies:    The following studies were reviewed today: (if available, images/films reviewed: From Epic Chart or Care Everywhere) None.:  Interval History:   Heather Mcintyre returns here today overall stable from a cardiac standpoint.  She never did wear the monitor.  She has been having her radiation treatment for invasive ductal carcinoma of the left breast.  No chest pain or pressure.  Just fatigue.  No syncope or near syncope.  No real PND or orthopnea.  The only chest pain she had was from her abscess. There is no comment about bradycardia when she went for  her I&D.  CV Review of Symptoms (Summary): no chest pain or dyspnea on exertion negative for - edema, irregular heartbeat, palpitations, paroxysmal nocturnal dyspnea, rapid heart rate, or shortness of breath  REVIEWED OF SYSTEMS   Notable for healing abscess in the left breast.  Poor sleep from anxiety and nocturia.  ===================== I have reviewed and (if needed) personally updated the patient's problem list, medications, allergies, past medical and surgical history, social and family history.   PAST MEDICAL HISTORY    Past Medical History:  Diagnosis Date   Anxiety    Asthma    Breast cancer (HCC)    Cervical cancer (HCC)    Complication of anesthesia    woke up in the middle of surgery 2010   Degenerative joint disease/osteoarthritis    Depression    GERD (gastroesophageal reflux disease)    Hypertension    Lupus (HCC)    Pre-diabetes     PAST SURGICAL HISTORY   Past Surgical History:  Procedure Laterality Date   ABDOMINAL HYSTERECTOMY     BREAST BIOPSY Left 10/04/2022   [place] LT BREAST BX W LOC DEV 1ST LESION IMG BX SPEC US GUIDE 10/04/2022 [facility]   BREAST LUMPECTOMY,RADIO FREQ LOCALIZER,AXILLARY SENTINEL LYMPH NODE BIOPSY Left 11/07/2022   Procedure: BREAST LUMPECTOMY,RADIO FREQ LOCALIZER,AXILLARY SENTINEL LYMPH NODE BIOPSY;  Surgeon: Henrene Dodge, MD;  Location: [facility] OR[facility];  Service: General;  Laterality: Left;   CHOLECYSTECTOMY N/A    COLONOSCOPY WITH ESOPHAGOGASTRODUODENOSCOPY (EGD)     INCISION AND DRAINAGE ABSCESS Left 04/11/2023   Procedure: INCISION AND DRAINAGE ABSCESS, breast;  Surgeon: Henrene Dodge, MD;  Location: [facility] ORS;  Service: General;  Laterality: Left;   JOINT REPLACEMENT     LAPAROSCOPIC GASTRIC BAND REMOVAL WITH LAPAROSCOPIC GASTRIC SLEEVE RESECTION     REPLACEMENT TOTAL KNEE BILATERAL     TUBAL LIGATION       There is no immunization history on file for this patient.  MEDICATIONS/ALLERGIES   Current Meds  Medication Sig   acetaminophen (TYLENOL) 500 MG tablet Take 2 tablets (1,000 mg total) by mouth every 6 (six) hours as needed for mild pain.   ALOE VERA PO Take 1 tablet by mouth daily. Natural aloe   anastrozole (ARIMIDEX) 1 MG tablet Take 1 tablet (1 mg total) by mouth daily.   ascorbic acid (VITAMIN C) 500 MG tablet Take 500 mg by mouth daily.   B COMPLEX VITAMINS PO Take 1 tablet by mouth daily.   celecoxib (CELEBREX) 200 MG capsule Take 1 capsule (200 mg total) by mouth 2 (two) times daily as needed for moderate pain.   cetirizine (ZYRTEC) 10  MG tablet Take 10 mg by mouth daily.   colchicine 0.6 MG tablet Take 1 tablet (0.6 mg total) by mouth daily.   diphenhydrAMINE (BENADRYL) 25 mg capsule Take 25 mg by mouth every 6 (six) hours as needed.   fluticasone (FLONASE) 50 MCG/ACT nasal spray Place 1 spray into both nostrils daily.   furosemide (LASIX) 40 MG tablet Take 40 mg by mouth daily as needed.   gabapentin (NEURONTIN) 300 MG capsule Take 300 mg by mouth at bedtime.   hydrOXYzine (ATARAX) 10 MG tablet Take 10 mg by mouth 2 (two) times daily.   indomethacin (INDOCIN) 50 MG capsule Take 1 capsule (50 mg total) by mouth 3 (three) times daily with meals.   linaclotide (LINZESS) 290 MCG CAPS capsule Take 290 mcg by mouth daily before breakfast.   metoprolol succinate (TOPROL XL) 25 MG 24 hr tablet  Take 1 tablet (25 mg total) by mouth daily.   montelukast (SINGULAIR) 10 MG tablet Take 10 mg by mouth at bedtime.   multivitamin-lutein (OCUVITE-LUTEIN) CAPS capsule Take 1 capsule by mouth as needed.   omeprazole (PRILOSEC) 40 MG capsule    tiZANidine (ZANAFLEX) 4 MG tablet Take 4 mg by mouth every 8 (eight) hours as needed for muscle spasms.   traZODone (DESYREL) 100 MG tablet Take 100-300 mg by mouth at bedtime.   valsartan-hydrochlorothiazide (DIOVAN HCT) 80-12.5 MG tablet Take 0.5 tablets by mouth daily.   [DISCONTINUED] metoprolol succinate (TOPROL-XL) 50 MG 24 hr tablet Take 50 mg by mouth daily. Take with or immediately following a meal.    Allergies  Allergen Reactions   Cephalexin Hives and Other (See Comments)   Etodolac Hives   Iodinated Contrast Media Hives   Latex Hives and Other (See Comments)   Penicillin G Hives   Fish-Derived Products Other (See Comments)   Iodine Other (See Comments)   Penicillins    Lisinopril Other (See Comments)    SOCIAL HISTORY/FAMILY HISTORY   Reviewed in Epic:  Pertinent findings:  Social History   Tobacco Use   Smoking status: Never    Passive exposure: Never   Smokeless tobacco:  Never  Vaping Use   Vaping Use: Never used  Substance Use Topics   Alcohol use: No   Drug use: Never   Social History   Social History Narrative   Divorced.  Lives alone.  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   Moved from [place] to [place] to help her son and daughter-in-law with 3 grandchildren.   2 cups coffee a day.  No alcohol.  Non-smoker.  No exercise.    OBJCTIVE -PE, EKG, labs   Wt Readings from Last 3 Encounters:  05/03/23 265 lb 6.4 oz (120.4 kg)  04/25/23 264 lb (119.7 kg)  04/11/23 262 lb (118.8 kg)    Physical Exam: Physical Exam Vitals reviewed.  Constitutional:      General: She is not in acute distress.    Appearance: Normal appearance. She is obese. She is not ill-appearing or toxic-appearing.     Comments: Well-nourished well-groomed.  HENT:     Head: Normocephalic and atraumatic.  Cardiovascular:     Rate and Rhythm: Regular rhythm. Bradycardia present.     Pulses: Normal pulses.     Heart sounds: No murmur heard.    No friction rub. No gallop.  Pulmonary:     Effort: Pulmonary effort is normal.     Breath sounds: Normal breath sounds. No wheezing, rhonchi or rales.  Chest:     Chest wall: Tenderness (Tender along the abscess site in her left breast but otherwise no musculoskeletal tenderness.) present.  Musculoskeletal:        General: Normal range of motion.  Skin:    General: Skin is warm and dry.  Neurological:     General: No focal deficit present.     Mental Status: She is alert and oriented to person, place, and time.  Psychiatric:        Mood and Affect: Mood normal.        Behavior: Behavior normal.        Thought Content: Thought content normal.        Judgment: Judgment normal.     Adult ECG Report  Rate: 51;  Rhythm: sinus bradycardia and LVH, left atrial enlargement, nonspecific  ST and T wave changes. ;   Narrative Interpretation:  Stable  Recent Labs: Reviewed No results found for: "CHOL", "HDL", "LDLCALC", "LDLDIRECT", "TRIG", "CHOLHDL" Lab Results  Component Value Date   CREATININE 0.84 04/11/2023   BUN 17 04/11/2023   NA 140 04/11/2023   K 3.8 04/11/2023   CL 103 04/11/2023   CO2 28 04/11/2023      Latest Ref Rng & Units 04/11/2023    4:06 PM 01/11/2023    1:15 AM 12/21/2022    2:47 PM  CBC  WBC 4.0 - 10.5 K/uL 9.1  7.0  6.6   Hemoglobin 12.0 - 15.0 g/dL 16.1  09.6  04.5   Hematocrit 36.0 - 46.0 % 38.6  37.1  42.1   Platelets 150 - 400 K/uL 265  261  291     No results found for: "HGBA1C" No results found for: "TSH"  ================================================== I spent a total of 14 minutes with the patient spent in direct patient consultation.  Additional time spent with chart review  / charting (studies, outside notes, etc): 13 min Total Time: 27 min  Current medicines are reviewed at length with the patient today.  (+/- concerns) none  Notice: This dictation was prepared with Dragon dictation along with smart phrase technology. Any transcriptional errors that result from this process are unintentional and may not be corrected upon review.  Studies Ordered:  Orders Placed This Encounter  Procedures   Basic Metabolic Panel (BMET)   EKG 12-Lead   Meds ordered this encounter  Medications   valsartan-hydrochlorothiazide (DIOVAN HCT) 80-12.5 MG tablet    Sig: Take 0.5 tablets by mouth daily.    Dispense:  45 tablet    Refill:  3   metoprolol succinate (TOPROL XL) 25 MG 24 hr tablet    Sig: Take 1 tablet (25 mg total) by mouth daily.    Dispense:  90 tablet    Refill:  3    Patient Instructions / Medication Changes & Studies & Tests Ordered   Patient Instructions  Medication Instructions:  Your physician has recommended you make the following change in your medication:   valsartan-hydrochlorothiazide (DIOVAN HCT) 80-12.5 MG tablet - Take 0.5 tablets by mouth daily  metoprolol  succinate (TOPROL XL) 25 MG 24 hr tablet - Take 1 tablet (25 mg total) by mouth daily  *If you need a refill on your cardiac medications before your next appointment, please call your pharmacy*  Lab Work: Your physician recommends that you return for lab work in 2 weeks: BMP Medical Mall Entrance at [facility] 1st desk on the right to check in (REGISTRATION)  Lab hours: Monday- Friday (7:30 am- 5:30 pm)  If you have labs (blood work) drawn today and your tests are completely normal, you will receive your results only by: MyChart Message (if you have MyChart) OR A paper copy in the mail If you have any lab test that is abnormal or we need to change your treatment, we will call you to review the results.  Testing/Procedures: -None ordered  Follow-Up: At [facility], you and your health needs are our priority.  As part of our continuing mission to provide you with exceptional heart care, we have created designated Provider Care Teams.  These Care Teams include your primary Cardiologist (physician) and Advanced Practice Providers (APPs -  Physician Assistants and Nurse Practitioners) who all work together to provide you with the care you need, when you need it.  Your next appointment:   1  month(s)  Provider:   You may see Bryan Lemma, MD or one of the following Advanced Practice Providers on your designated Care Team:   Nicolasa Ducking, NP Eula Listen, PA-C Cadence Fransico Michael, PA-C Charlsie Quest, NP    Other Instructions -None      Marykay Lex, MD, MS Bryan Lemma, M.D., M.S. Interventional Cardiologist  [facility]   [address]; [address] [place], [place]  [address] [phone]           Fax [phone]    Thank you for choosing [facility] in [place]!!

## 2023-05-03 NOTE — Patient Instructions (Signed)
Medication Instructions:  Your physician has recommended you make the following change in your medication:   valsartan-hydrochlorothiazide (DIOVAN HCT) 80-12.5 MG tablet - Take 0.5 tablets by mouth daily  metoprolol succinate (TOPROL XL) 25 MG 24 hr tablet - Take 1 tablet (25 mg total) by mouth daily  *If you need a refill on your cardiac medications before your next appointment, please call your pharmacy*  Lab Work: Your physician recommends that you return for lab work in 2 weeks: BMP Medical Mall Entrance at [facility] 1st desk on the right to check in (REGISTRATION)  Lab hours: Monday- Friday (7:30 am- 5:30 pm)  If you have labs (blood work) drawn today and your tests are completely normal, you will receive your results only by: MyChart Message (if you have MyChart) OR A paper copy in the mail If you have any lab test that is abnormal or we need to change your treatment, we will call you to review the results.  Testing/Procedures: -None ordered  Follow-Up: At [facility], you and your health needs are our priority.  As part of our continuing mission to provide you with exceptional heart care, we have created designated Provider Care Teams.  These Care Teams include your primary Cardiologist (physician) and Advanced Practice Providers (APPs -  Physician Assistants and Nurse Practitioners) who all work together to provide you with the care you need, when you need it.  Your next appointment:   1 month(s)  Provider:   You may see Bryan Lemma, MD or one of the following Advanced Practice Providers on your designated Care Team:   Nicolasa Ducking, NP Eula Listen, PA-C Cadence Fransico Michael, PA-C Charlsie Quest, NP    Other Instructions -None

## 2023-05-13 ENCOUNTER — Encounter: Payer: Self-pay | Admitting: Cardiology

## 2023-05-13 NOTE — Assessment & Plan Note (Signed)
Based on concerns of bradycardia and recurrent sinus bradycardia today, we will reduce Toprol to 25 mg daily.  In doing so we will increase BP control by adding valsartan-HCTZ.

## 2023-05-13 NOTE — Assessment & Plan Note (Signed)
Discussed portance of diet and exercise for weight loss.

## 2023-05-13 NOTE — Assessment & Plan Note (Addendum)
BP high today.  Also bradycardic. Plan: Reduce Toprol to 25 mg daily to avoid excess bradycardia.  Add valsartan and 40-12.5 mg daily. Follow-up BMP in 2 weeks. BP follow-up in 1 month with either me or APP.

## 2023-05-16 ENCOUNTER — Encounter: Payer: Self-pay | Admitting: Surgery

## 2023-05-16 ENCOUNTER — Ambulatory Visit (INDEPENDENT_AMBULATORY_CARE_PROVIDER_SITE_OTHER): Payer: Medicare HMO | Admitting: Surgery

## 2023-05-16 VITALS — BP 151/89 | HR 66 | Temp 98.6°F | Ht 68.0 in | Wt 269.0 lb

## 2023-05-16 DIAGNOSIS — Z09 Encounter for follow-up examination after completed treatment for conditions other than malignant neoplasm: Secondary | ICD-10-CM

## 2023-05-16 DIAGNOSIS — C50912 Malignant neoplasm of unspecified site of left female breast: Secondary | ICD-10-CM

## 2023-05-16 DIAGNOSIS — N611 Abscess of the breast and nipple: Secondary | ICD-10-CM

## 2023-05-16 NOTE — Progress Notes (Signed)
05/16/2023  History of Present Illness: Heather Mcintyre is a 57 y.o. female status post left breast lumpectomy and sentinel lymph node biopsy on 11/07/2022 for invasive breast cancer.  Overall stage was T2, N0.  She was seen in the office on 04/11/2023 due to development of a left breast abscess at the site of her lumpectomy.  She was taken to the operating room that same day and had an incision and drainage of the left breast abscess.  She is currently having dressing changes with wet-to-dry gauze once daily.  The patient was last seen on 04/25/2023 at which time the will continue to heal appropriately.  Today she reports that she is still doing well although the nurse that recently came to do her last dressing change mention there was concern that the skin was starting to "curl in itself".  Patient denies any worsening pain and reports that now she is able to do her dressing change on her own without any significant tenderness.  She does report having itchiness at the skin around the incision likely related to the tape used for her dressing changes.  Past Medical History: Past Medical History:  Diagnosis Date   Anxiety    Asthma    Breast cancer (HCC)    Cervical cancer (HCC)    Complication of anesthesia    woke up in the middle of surgery 2010   Degenerative joint disease/osteoarthritis    Depression    GERD (gastroesophageal reflux disease)    Hypertension    Lupus (HCC)    Pre-diabetes      Past Surgical History: Past Surgical History:  Procedure Laterality Date   ABDOMINAL HYSTERECTOMY     BREAST BIOPSY Left 10/04/2022   [place] LT BREAST BX W LOC DEV 1ST LESION IMG BX SPEC US GUIDE 10/04/2022 [facility]   BREAST LUMPECTOMY,RADIO FREQ LOCALIZER,AXILLARY SENTINEL LYMPH NODE BIOPSY Left 11/07/2022   Procedure: BREAST LUMPECTOMY,RADIO FREQ LOCALIZER,AXILLARY SENTINEL LYMPH NODE BIOPSY;  Surgeon: Henrene Dodge, MD;  Location: [facility];  Service: General;  Laterality: Left;    CHOLECYSTECTOMY N/A    COLONOSCOPY WITH ESOPHAGOGASTRODUODENOSCOPY (EGD)     INCISION AND DRAINAGE ABSCESS Left 04/11/2023   Procedure: INCISION AND DRAINAGE ABSCESS, breast;  Surgeon: Henrene Dodge, MD;  Location: [facility];  Service: General;  Laterality: Left;   JOINT REPLACEMENT     LAPAROSCOPIC GASTRIC BAND REMOVAL WITH LAPAROSCOPIC GASTRIC SLEEVE RESECTION     REPLACEMENT TOTAL KNEE BILATERAL     TUBAL LIGATION      Home Medications: Prior to Admission medications   Medication Sig Start Date End Date Taking? Authorizing Provider  acetaminophen (TYLENOL) 500 MG tablet Take 2 tablets (1,000 mg total) by mouth every 6 (six) hours as needed for mild pain. 11/07/22  Yes Diann Bangerter, MD  ALOE VERA PO Take 1 tablet by mouth daily. Natural aloe   Yes [provider]  anastrozole (ARIMIDEX) 1 MG tablet Take 1 tablet (1 mg total) by mouth daily. 03/28/23  Yes Jeralyn Ruths, MD  ascorbic acid (VITAMIN C) 500 MG tablet Take 500 mg by mouth daily.   Yes [provider]  B COMPLEX VITAMINS PO Take 1 tablet by mouth daily.   Yes [provider]  celecoxib (CELEBREX) 200 MG capsule Take 1 capsule (200 mg total) by mouth 2 (two) times daily as needed for moderate pain. 11/07/22  Yes Chizara Mena, Elita Quick, MD  cetirizine (ZYRTEC) 10 MG tablet Take 10 mg by mouth daily.   Yes [provider]  colchicine 0.6 MG tablet Take 1 tablet (0.6 mg total) by mouth daily. 01/11/23 01/11/24 Yes Ward, Layla Maw, DO  diphenhydrAMINE (BENADRYL) 25 mg capsule Take 25 mg by mouth every 6 (six) hours as needed.   Yes [provider]  fluticasone (FLONASE) 50 MCG/ACT nasal spray Place 1 spray into both nostrils daily.   Yes [provider]  furosemide (LASIX) 40 MG tablet Take 40 mg by mouth daily as needed.   Yes [provider]  gabapentin (NEURONTIN) 300 MG capsule Take 300 mg by mouth at bedtime. 06/28/16  Yes [provider]  hydrOXYzine (ATARAX) 10 MG  tablet Take 10 mg by mouth 2 (two) times daily. 09/26/22  Yes [provider]  indomethacin (INDOCIN) 50 MG capsule Take 1 capsule (50 mg total) by mouth 3 (three) times daily with meals. 01/11/23  Yes Ward, Layla Maw, DO  linaclotide (LINZESS) 290 MCG CAPS capsule Take 290 mcg by mouth daily before breakfast.   Yes [provider]  metoprolol succinate (TOPROL XL) 25 MG 24 hr tablet Take 1 tablet (25 mg total) by mouth daily. 05/03/23  Yes Marykay Lex, MD  montelukast (SINGULAIR) 10 MG tablet Take 10 mg by mouth at bedtime.   Yes [provider]  multivitamin-lutein (OCUVITE-LUTEIN) CAPS capsule Take 1 capsule by mouth as needed.   Yes [provider]  omeprazole (PRILOSEC) 40 MG capsule  09/13/17  Yes [provider]  ondansetron (ZOFRAN-ODT) 4 MG disintegrating tablet Take 1 tablet (4 mg total) by mouth every 6 (six) hours as needed for nausea or vomiting. 01/11/23  Yes Ward, Baxter Hire N, DO  tiZANidine (ZANAFLEX) 4 MG tablet Take 4 mg by mouth every 8 (eight) hours as needed for muscle spasms.   Yes [provider]  traZODone (DESYREL) 100 MG tablet Take 100-300 mg by mouth at bedtime.   Yes [provider]  valsartan-hydrochlorothiazide (DIOVAN HCT) 80-12.5 MG tablet Take 0.5 tablets by mouth daily. 05/03/23  Yes Marykay Lex, MD  metoprolol succinate (TOPROL-XL) 50 MG 24 hr tablet Take 50 mg by mouth daily. Take with or immediately following a meal.    [provider]    Allergies: Allergies  Allergen Reactions   Cephalexin Hives and Other (See Comments)   Etodolac Hives   Iodinated Contrast Media Hives   Latex Hives and Other (See Comments)   Penicillin G Hives   Fish-Derived Products Other (See Comments)   Iodine Other (See Comments)   Penicillins    Lisinopril Other (See Comments)    Review of Systems: Review of Systems  Constitutional:  Negative for chills and fever.  Respiratory:  Negative for shortness  of breath.   Cardiovascular:  Negative for chest pain.  Gastrointestinal:  Negative for abdominal pain, nausea and vomiting.    Physical Exam BP (!) 151/89   Pulse 66   Temp 98.6 F (37 C) (Oral)   Ht 5\' 8"  (1.727 m)   Wt 269 lb (122 kg)   SpO2 98%   BMI 40.90 kg/m  CONSTITUTIONAL: No acute distress, well-nourished HEENT:  Normocephalic, atraumatic, extraocular motion intact. RESPIRATORY:  Normal respiratory effort without pathologic use of accessory muscles. CARDIOVASCULAR: Regular rhythm and rate. BREAST: Left breast status post I&D of her abscess in the upper outer quadrant.  Wound is healing appropriately with healthy granulation tissue at the base and healthy skin edges, there is no evidence of any necrosis or any complications with the wound.  Wet-to-dry gauze dressing  was reapplied.  Otherwise on breast exam, there is no palpable masses, any other skin changes, or nipple changes.  There is no left axillary lymphadenopathy.  Right breast without any palpable masses, skin changes, or nipple changes.  No right axillary lymphadenopathy. NEUROLOGIC:  Motor and sensation is grossly normal.  Cranial nerves are grossly intact. PSYCH:  Alert and oriented to person, place and time. Affect is normal.   Assessment and Plan: This is a 57 y.o. female status post left breast lumpectomy and sentinel lymph node biopsy complicated by left breast abscess.  - Patient's wound is healing appropriately with healthy granulation tissue and smaller in size compared to prior.  The patient is able to do her wet-to-dry dressing changes by herself.  For now continue doing same dressing changes once daily discussed with her that the wound will continue to heal as it is doing.  Otherwise on her 43-month follow-up exam today, there is no evidence of any new disease or suspicious findings. - Patient will follow-up with me again in 3 weeks for wound check but otherwise in 6 months for another breast exam.  I spent  20 minutes dedicated to the care of this patient on the date of this encounter to include pre-visit review of records, face-to-face time with the patient discussing diagnosis and management, and any post-visit coordination of care.   Howie Ill, MD [facility]

## 2023-05-16 NOTE — Patient Instructions (Signed)
If you have any concerns or questions, please feel free to call our office. See follow up appointment.   Breast Self-Awareness Breast self-awareness is knowing how your breasts look and feel. You need to: Check your breasts on a regular basis. Tell your doctor about any changes. Become familiar with the look and feel of your breasts. This can help you catch a breast problem while it is still small and can be treated. You should do breast self-exams even if you have breast implants. What you need: A mirror. A well-lit room. A pillow or other soft object. How to do a breast self-exam Follow these steps to do a breast self-exam: Look for changes  Take off all the clothes above your waist. Stand in front of a mirror in a room with good lighting. Put your hands down at your sides. Compare your breasts in the mirror. Look for any difference between them, such as: A difference in shape. A difference in size. Wrinkles, dips, and bumps in one breast and not the other. Look at each breast for changes in the skin, such as: Redness. Scaly areas. Skin that has gotten thicker. Dimpling. Open sores (ulcers). Look for changes in your nipples, such as: Fluid coming out of a nipple. Fluid around a nipple. Bleeding. Dimpling. Redness. A nipple that looks pushed in (retracted), or that has changed position. Feel for changes Lie on your back. Feel each breast. To do this: Pick a breast to feel. Place a pillow under the shoulder closest to that breast. Put the arm closest to that breast behind your head. Feel the nipple area of that breast using the hand of your other arm. Feel the area with the pads of your three middle fingers by making small circles with your fingers. Use light, medium, and firm pressure. Continue the overlapping circles, moving downward over the breast. Keep making circles with your fingers. Stop when you feel your ribs. Start making circles with your fingers again, this time  going upward until you reach your collarbone. Then, make circles outward across your breast and into your armpit area. Squeeze your nipple. Check for discharge and lumps. Repeat these steps to check your other breast. Sit or stand in the tub or shower. With soapy water on your skin, feel each breast the same way you did when you were lying down. Write down what you find Writing down what you find can help you remember what to tell your doctor. Write down: What is normal for each breast. Any changes you find in each breast. These include: The kind of changes you find. A tender or painful breast. Any lump you find. Write down its size and where it is. When you last had your monthly period (menstrual cycle). General tips If you are breastfeeding, the best time to check your breasts is after you feed your baby or after you use a breast pump. If you get monthly bleeding, the best time to check your breasts is 5-7 days after your monthly cycle ends. With time, you will become comfortable with the self-exam. You will also start to know if there are changes in your breasts. Contact a doctor if: You see a change in the shape or size of your breasts or nipples. You see a change in the skin of your breast or nipples, such as red or scaly skin. You have fluid coming from your nipples that is not normal. You find a new lump or thick area. You have breast pain. You have  any concerns about your breast health. Summary Breast self-awareness includes looking for changes in your breasts and feeling for changes within your breasts. You should do breast self-awareness in front of a mirror in a well-lit room. If you get monthly periods (menstrual cycles), the best time to check your breasts is 5-7 days after your period ends. Tell your doctor about any changes you see in your breasts. Changes include changes in size, changes on the skin, painful or tender breasts, or fluid from your nipples that is not  normal. This information is not intended to replace advice given to you by your health care provider. Make sure you discuss any questions you have with your health care provider. Document Revised: 04/20/2022 Document Reviewed: 09/15/2021 Elsevier Patient Education  2024 [employer].

## 2023-05-25 ENCOUNTER — Ambulatory Visit: Payer: Medicare HMO | Admitting: Surgery

## 2023-05-25 ENCOUNTER — Encounter: Payer: Medicare HMO | Admitting: Surgery

## 2023-06-06 ENCOUNTER — Encounter: Payer: Self-pay | Admitting: Surgery

## 2023-06-06 ENCOUNTER — Ambulatory Visit: Payer: Medicare HMO | Admitting: Surgery

## 2023-06-06 VITALS — BP 170/100 | HR 97 | Temp 98.0°F | Ht 68.0 in | Wt 267.0 lb

## 2023-06-06 DIAGNOSIS — N611 Abscess of the breast and nipple: Secondary | ICD-10-CM

## 2023-06-06 DIAGNOSIS — Z09 Encounter for follow-up examination after completed treatment for conditions other than malignant neoplasm: Secondary | ICD-10-CM

## 2023-06-06 NOTE — Progress Notes (Signed)
06/06/2023  HPI: Heather Mcintyre is a 57 y.o. female s/p I&D of left breast abscess on 04/11/2023.  Patient presents today for follow-up.  Patient reports that her wound still not fully healed.  She has been noticing drainage and doing dressing changes daily.  She has noticed the color of the drainage is green tinge but denies any worsening pain or fevers.  She does report some itching around the incision itself.  Vital signs: BP (!) 170/100   Pulse 97   Temp 98 F (36.7 C)   Ht 5\' 8"  (1.727 m)   Wt 267 lb (121.1 kg)   SpO2 97%   BMI 40.60 kg/m    Physical Exam: Constitutional: No acute distress Breast: Left breast wound is still healing and smaller in size measuring now about 1.5 cm in diameter.  The wound bed itself is clean and healthy without any fibrinous material, necrosis, or purulence.  New wet-to-dry dressing applied.  Assessment/Plan: This is a 57 y.o. female s/p I&D of left breast abscess.  - Discussed with patient that the wound itself appears very clean and healthy.  Despite of the drainage color, I do not think there is any evidence of infection or complications at this point.  Continue doing dressing changes as currently doing.  No need for antibiotics at this point.  Recommend that she can apply moisturizing lotion versus Benadryl ointment to the surrounding skin to help with any itching.  However do not apply anything inside the wound itself. - Follow-up in 1 month to reassess her progress.   Howie Ill, MD [facility]

## 2023-06-06 NOTE — Patient Instructions (Addendum)
Continue to change your dressing everyday. You may change the dressing twice a day if needed.  Follow up here in 1 month.  You may try using benadryl ointment around the wound for itching.

## 2023-06-08 ENCOUNTER — Ambulatory Visit: Payer: Medicare HMO | Admitting: Nurse Practitioner

## 2023-06-28 ENCOUNTER — Inpatient Hospital Stay: Payer: Medicare HMO | Admitting: Oncology

## 2023-06-28 ENCOUNTER — Other Ambulatory Visit: Payer: Self-pay

## 2023-06-28 MED ORDER — ANASTROZOLE 1 MG PO TABS: 1.0000 mg | ORAL_TABLET | Freq: Every day | ORAL | 3 refills | Status: DC

## 2023-07-06 ENCOUNTER — Encounter: Payer: Medicare HMO | Admitting: Surgery

## 2023-07-12 ENCOUNTER — Encounter: Payer: Self-pay | Admitting: Oncology

## 2023-07-12 ENCOUNTER — Inpatient Hospital Stay: Payer: Medicare HMO | Attending: Oncology | Admitting: Oncology

## 2023-07-12 VITALS — BP 159/73 | HR 64 | Temp 97.8°F | Resp 18 | Ht 68.0 in | Wt 276.0 lb

## 2023-07-12 DIAGNOSIS — C50912 Malignant neoplasm of unspecified site of left female breast: Secondary | ICD-10-CM | POA: Insufficient documentation

## 2023-07-12 DIAGNOSIS — Z8249 Family history of ischemic heart disease and other diseases of the circulatory system: Secondary | ICD-10-CM | POA: Insufficient documentation

## 2023-07-12 DIAGNOSIS — I10 Essential (primary) hypertension: Secondary | ICD-10-CM | POA: Diagnosis not present

## 2023-07-12 DIAGNOSIS — Z888 Allergy status to other drugs, medicaments and biological substances status: Secondary | ICD-10-CM | POA: Insufficient documentation

## 2023-07-12 DIAGNOSIS — Z923 Personal history of irradiation: Secondary | ICD-10-CM | POA: Diagnosis not present

## 2023-07-12 DIAGNOSIS — Z880 Allergy status to penicillin: Secondary | ICD-10-CM | POA: Diagnosis not present

## 2023-07-12 DIAGNOSIS — Z8541 Personal history of malignant neoplasm of cervix uteri: Secondary | ICD-10-CM | POA: Insufficient documentation

## 2023-07-12 DIAGNOSIS — Z90710 Acquired absence of both cervix and uterus: Secondary | ICD-10-CM | POA: Insufficient documentation

## 2023-07-12 DIAGNOSIS — Z881 Allergy status to other antibiotic agents status: Secondary | ICD-10-CM | POA: Insufficient documentation

## 2023-07-12 DIAGNOSIS — Z170 Estrogen receptor positive status [ER+]: Secondary | ICD-10-CM | POA: Insufficient documentation

## 2023-07-12 DIAGNOSIS — R232 Flushing: Secondary | ICD-10-CM | POA: Diagnosis not present

## 2023-07-12 DIAGNOSIS — Z79899 Other long term (current) drug therapy: Secondary | ICD-10-CM | POA: Insufficient documentation

## 2023-07-12 DIAGNOSIS — Z79811 Long term (current) use of aromatase inhibitors: Secondary | ICD-10-CM | POA: Insufficient documentation

## 2023-07-12 NOTE — Progress Notes (Signed)
[facility]  [phone] [phone]) [phone]  ID: Heather Mcintyre OB: 02-06-1966  MR#: [id number]  AOZ#:[id number]  Patient Care Team: Emogene Morgan, MD as PCP - General (Family Medicine) Marykay Lex, MD as PCP - Cardiology (Cardiology) Hulen Luster, RN as Oncology Nurse Navigator Orlie Dakin, Tollie Pizza, MD as Consulting Physician (Oncology) Carmina Miller, MD as Consulting Physician (Radiation Oncology) Henrene Dodge, MD as Consulting Physician (General Surgery)  CHIEF COMPLAINT: Pathologic stage Ia ER/PR positive, HER2 negative invasive carcinoma of the left breast.  Oncotype Dx score 0.  INTERVAL HISTORY: Patient returns to clinic today for routine 78-month evaluation and to assess her toleration of anastrozole.  Her hot flashes have resolved since discontinuing letrozole.  She currently feels well and is asymptomatic.  She has no neurologic complaints.  She denies any recent fevers or illnesses.  She has a good appetite and denies weight loss.  She has no chest pain, shortness of breath, cough, or hemoptysis.  She denies any nausea, vomiting, constipation, or diarrhea.  She has no urinary complaints.  Patient offers no specific complaints today.  REVIEW OF SYSTEMS:   Review of Systems  Constitutional: Negative.  Negative for fever, malaise/fatigue and weight loss.  Respiratory: Negative.  Negative for cough, hemoptysis and shortness of breath.   Cardiovascular: Negative.  Negative for chest pain and leg swelling.  Gastrointestinal: Negative.  Negative for abdominal pain.  Genitourinary: Negative.  Negative for dysuria.  Musculoskeletal: Negative.  Negative for back pain.  Skin: Negative.  Negative for rash.  Neurological: Negative.  Negative for dizziness, sensory change, focal weakness, weakness and headaches.  Psychiatric/Behavioral: Negative.  The patient is not nervous/anxious.     As per HPI. Otherwise, a complete review of systems is  negative.  PAST MEDICAL HISTORY: Past Medical History:  Diagnosis Date   Anxiety    Asthma    Breast cancer (HCC)    Cervical cancer (HCC)    Complication of anesthesia    woke up in the middle of surgery 2010   Degenerative joint disease/osteoarthritis    Depression    GERD (gastroesophageal reflux disease)    Hypertension    Lupus (HCC)    Pre-diabetes     PAST SURGICAL HISTORY: Past Surgical History:  Procedure Laterality Date   ABDOMINAL HYSTERECTOMY     BREAST BIOPSY Left 10/04/2022   [place] LT BREAST BX W LOC DEV 1ST LESION IMG BX SPEC US GUIDE 10/04/2022 [facility]   BREAST LUMPECTOMY,RADIO FREQ LOCALIZER,AXILLARY SENTINEL LYMPH NODE BIOPSY Left 11/07/2022   Procedure: BREAST LUMPECTOMY,RADIO FREQ LOCALIZER,AXILLARY SENTINEL LYMPH NODE BIOPSY;  Surgeon: Henrene Dodge, MD;  Location: [facility];  Service: General;  Laterality: Left;   CHOLECYSTECTOMY N/A    COLONOSCOPY WITH ESOPHAGOGASTRODUODENOSCOPY (EGD)     INCISION AND DRAINAGE ABSCESS Left 04/11/2023   Procedure: INCISION AND DRAINAGE ABSCESS, breast;  Surgeon: Henrene Dodge, MD;  Location: [facility];  Service: General;  Laterality: Left;   JOINT REPLACEMENT     LAPAROSCOPIC GASTRIC BAND REMOVAL WITH LAPAROSCOPIC GASTRIC SLEEVE RESECTION     REPLACEMENT TOTAL KNEE BILATERAL     TUBAL LIGATION      FAMILY HISTORY: Family History  Problem Relation Age of Onset   Hypertension Mother    Hypertension Father    Breast cancer Neg Hx     ADVANCED DIRECTIVES (Y/N):  N  HEALTH MAINTENANCE: Social History   Tobacco Use   Smoking status: Never    Passive exposure: Never   Smokeless tobacco:  Never  Vaping Use   Vaping status: Never Used  Substance Use Topics   Alcohol use: No   Drug use: Never     Colonoscopy:  PAP:  Bone density:  Lipid panel:  Allergies  Allergen Reactions   Cephalexin Hives and Other (See Comments)   Etodolac Hives   Iodinated Contrast Media Hives   Latex Hives and Other (See  Comments)   Penicillin G Hives   Fish-Derived Products Other (See Comments)   Iodine Other (See Comments)   Penicillins    Lisinopril Other (See Comments)    Current Outpatient Medications  Medication Sig Dispense Refill   acetaminophen (TYLENOL) 500 MG tablet Take 2 tablets (1,000 mg total) by mouth every 6 (six) hours as needed for mild pain.     ALOE VERA PO Take 1 tablet by mouth daily. Natural aloe     anastrozole (ARIMIDEX) 1 MG tablet Take 1 tablet (1 mg total) by mouth daily. 30 tablet 3   ascorbic acid (VITAMIN C) 500 MG tablet Take 500 mg by mouth daily.     B COMPLEX VITAMINS PO Take 1 tablet by mouth daily.     celecoxib (CELEBREX) 200 MG capsule Take 1 capsule (200 mg total) by mouth 2 (two) times daily as needed for moderate pain. 40 capsule 1   cetirizine (ZYRTEC) 10 MG tablet Take 10 mg by mouth daily.     colchicine 0.6 MG tablet Take 1 tablet (0.6 mg total) by mouth daily. 30 tablet 0   diphenhydrAMINE (BENADRYL) 25 mg capsule Take 25 mg by mouth every 6 (six) hours as needed.     fluticasone (FLONASE) 50 MCG/ACT nasal spray Place 1 spray into both nostrils daily.     furosemide (LASIX) 40 MG tablet Take 40 mg by mouth daily as needed.     gabapentin (NEURONTIN) 300 MG capsule Take 300 mg by mouth at bedtime.     hydrOXYzine (ATARAX) 10 MG tablet Take 10 mg by mouth 2 (two) times daily.     indomethacin (INDOCIN) 50 MG capsule Take 1 capsule (50 mg total) by mouth 3 (three) times daily with meals. 21 capsule 0   linaclotide (LINZESS) 290 MCG CAPS capsule Take 290 mcg by mouth daily before breakfast.     metoprolol succinate (TOPROL XL) 25 MG 24 hr tablet Take 1 tablet (25 mg total) by mouth daily. 90 tablet 3   montelukast (SINGULAIR) 10 MG tablet Take 10 mg by mouth at bedtime.     multivitamin-lutein (OCUVITE-LUTEIN) CAPS capsule Take 1 capsule by mouth as needed.     omeprazole (PRILOSEC) 40 MG capsule      ondansetron (ZOFRAN-ODT) 4 MG disintegrating tablet Take 1  tablet (4 mg total) by mouth every 6 (six) hours as needed for nausea or vomiting. 20 tablet 0   tiZANidine (ZANAFLEX) 4 MG tablet Take 4 mg by mouth every 8 (eight) hours as needed for muscle spasms.     traZODone (DESYREL) 100 MG tablet Take 100-300 mg by mouth at bedtime.     valsartan-hydrochlorothiazide (DIOVAN HCT) 80-12.5 MG tablet Take 0.5 tablets by mouth daily. 45 tablet 3   No current facility-administered medications for this visit.    OBJECTIVE: Vitals:   07/12/23 0921  BP: (!) 159/73  Pulse: 64  Resp: 18  Temp: 97.8 F (36.6 C)  SpO2: 100%     Body mass index is 41.97 kg/m.    ECOG FS:0 - Asymptomatic  General: Well-developed, well-nourished, no acute distress.  Eyes: Pink conjunctiva, anicteric sclera. HEENT: Normocephalic, moist mucous membranes. Breast: Exam deferred today. Lungs: No audible wheezing or coughing. Heart: Regular rate and rhythm. Abdomen: Soft, nontender, no obvious distention. Musculoskeletal: No edema, cyanosis, or clubbing. Neuro: Alert, answering all questions appropriately. Cranial nerves grossly intact. Skin: No rashes or petechiae noted. Psych: Normal affect.  LAB RESULTS:  Lab Results  Component Value Date   NA 140 04/11/2023   K 3.8 04/11/2023   CL 103 04/11/2023   CO2 28 04/11/2023   GLUCOSE 91 04/11/2023   BUN 17 04/11/2023   CREATININE 0.84 04/11/2023   CALCIUM 8.9 04/11/2023   GFRNONAA >60 04/11/2023   GFRAA >60 02/10/2017    Lab Results  Component Value Date   WBC 9.1 04/11/2023   NEUTROABS 4.9 04/11/2023   HGB 12.7 04/11/2023   HCT 38.6 04/11/2023   MCV 90.6 04/11/2023   PLT 265 04/11/2023     STUDIES: No results found.  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  Oncotype Dx score 0.  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  Oncotype Dx score  was 0 indicating low risk and no benefit of adjuvant chemotherapy.  Patient completed adjuvant XRT in February 2024.  Letrozole has been discontinued secondary to hot flashes the patient was given a prescription for anastrozole.  Continue anastrozole for total of 5 years completing treatment in February 2029.  Patient will require repeat mammogram in December 2024.  She will have a video assisted telemedicine visit in 6 months for routine evaluation.   Bone health: Bone mineral density on February 22, 2023 reported T-score of -0.3 which is considered normal.  Repeat in March 2026.  I spent a total of 20 minutes reviewing chart data, face-to-face evaluation with the patient, counseling and coordination of care as detailed above.    Patient expressed understanding and was in agreement with this plan. She also understands that She can call clinic at any time with any questions, concerns, or complaints.    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

## 2023-07-18 ENCOUNTER — Encounter: Payer: Self-pay | Admitting: Surgery

## 2023-07-18 ENCOUNTER — Ambulatory Visit: Payer: Medicare HMO | Admitting: Surgery

## 2023-07-18 VITALS — BP 175/81 | HR 57 | Temp 98.2°F | Wt 276.0 lb

## 2023-07-18 DIAGNOSIS — Z170 Estrogen receptor positive status [ER+]: Secondary | ICD-10-CM | POA: Diagnosis not present

## 2023-07-18 DIAGNOSIS — N611 Abscess of the breast and nipple: Secondary | ICD-10-CM

## 2023-07-18 DIAGNOSIS — C50912 Malignant neoplasm of unspecified site of left female breast: Secondary | ICD-10-CM

## 2023-07-18 NOTE — Progress Notes (Signed)
07/18/2023  History of Present Illness: Heather Mcintyre is a 57 y.o. female status post I&D of a left breast abscess on 04/11/2023.  Patient is a history of a left breast lumpectomy and sentinel lymph node biopsy on 11/07/2022 for breast cancer.  The abscess developed within the excision cavity.  She presents today for follow-up.  She reports that the wound is now fully healed and has not needed any dressing changes recently.  However she reports she still having some issues with discomfort in the left axilla/lateral breast particular when she sleeps at night depending on positioning.  Past Medical History: Past Medical History:  Diagnosis Date   Anxiety    Asthma    Breast cancer (HCC)    Cervical cancer (HCC)    Complication of anesthesia    woke up in the middle of surgery 2010   Degenerative joint disease/osteoarthritis    Depression    GERD (gastroesophageal reflux disease)    Hypertension    Lupus (HCC)    Pre-diabetes      Past Surgical History: Past Surgical History:  Procedure Laterality Date   ABDOMINAL HYSTERECTOMY     BREAST BIOPSY Left 10/04/2022   [place] LT BREAST BX W LOC DEV 1ST LESION IMG BX SPEC US GUIDE 10/04/2022 [facility]   BREAST LUMPECTOMY,RADIO FREQ LOCALIZER,AXILLARY SENTINEL LYMPH NODE BIOPSY Left 11/07/2022   Procedure: BREAST LUMPECTOMY,RADIO FREQ LOCALIZER,AXILLARY SENTINEL LYMPH NODE BIOPSY;  Surgeon: Henrene Dodge, MD;  Location: [facility];  Service: General;  Laterality: Left;   CHOLECYSTECTOMY N/A    COLONOSCOPY WITH ESOPHAGOGASTRODUODENOSCOPY (EGD)     INCISION AND DRAINAGE ABSCESS Left 04/11/2023   Procedure: INCISION AND DRAINAGE ABSCESS, breast;  Surgeon: Henrene Dodge, MD;  Location: [facility];  Service: General;  Laterality: Left;   JOINT REPLACEMENT     LAPAROSCOPIC GASTRIC BAND REMOVAL WITH LAPAROSCOPIC GASTRIC SLEEVE RESECTION     REPLACEMENT TOTAL KNEE BILATERAL     TUBAL LIGATION      Home Medications: Prior to Admission medications    Medication Sig Start Date End Date Taking? Authorizing Provider  acetaminophen (TYLENOL) 500 MG tablet Take 2 tablets (1,000 mg total) by mouth every 6 (six) hours as needed for mild pain. 11/07/22  Yes Clemons Salvucci, MD  ALOE VERA PO Take 1 tablet by mouth daily. Natural aloe   Yes [provider]  anastrozole (ARIMIDEX) 1 MG tablet Take 1 tablet (1 mg total) by mouth daily. 06/28/23  Yes Jeralyn Ruths, MD  ascorbic acid (VITAMIN C) 500 MG tablet Take 500 mg by mouth daily.   Yes [provider]  B COMPLEX VITAMINS PO Take 1 tablet by mouth daily.   Yes [provider]  celecoxib (CELEBREX) 200 MG capsule Take 1 capsule (200 mg total) by mouth 2 (two) times daily as needed for moderate pain. 11/07/22  Yes Paullette Mckain, Elita Quick, MD  cetirizine (ZYRTEC) 10 MG tablet Take 10 mg by mouth daily.   Yes [provider]  colchicine 0.6 MG tablet Take 1 tablet (0.6 mg total) by mouth daily. 01/11/23 01/11/24 Yes Ward, Layla Maw, DO  diphenhydrAMINE (BENADRYL) 25 mg capsule Take 25 mg by mouth every 6 (six) hours as needed.   Yes [provider]  fluticasone (FLONASE) 50 MCG/ACT nasal spray Place 1 spray into both nostrils daily.   Yes [provider]  furosemide (LASIX) 40 MG tablet Take 40 mg by mouth daily as needed.   Yes [provider]  gabapentin (NEURONTIN) 300 MG capsule  Take 300 mg by mouth at bedtime. 06/28/16  Yes [provider]  hydrOXYzine (ATARAX) 10 MG tablet Take 10 mg by mouth 2 (two) times daily. 09/26/22  Yes [provider]  indomethacin (INDOCIN) 50 MG capsule Take 1 capsule (50 mg total) by mouth 3 (three) times daily with meals. 01/11/23  Yes Ward, Layla Maw, DO  linaclotide (LINZESS) 290 MCG CAPS capsule Take 290 mcg by mouth daily before breakfast.   Yes [provider]  metoprolol succinate (TOPROL XL) 25 MG 24 hr tablet Take 1 tablet (25 mg total) by mouth daily. 05/03/23  Yes Marykay Lex, MD   montelukast (SINGULAIR) 10 MG tablet Take 10 mg by mouth at bedtime.   Yes [provider]  multivitamin-lutein (OCUVITE-LUTEIN) CAPS capsule Take 1 capsule by mouth as needed.   Yes [provider]  omeprazole (PRILOSEC) 40 MG capsule  09/13/17  Yes [provider]  ondansetron (ZOFRAN-ODT) 4 MG disintegrating tablet Take 1 tablet (4 mg total) by mouth every 6 (six) hours as needed for nausea or vomiting. 01/11/23  Yes Ward, Baxter Hire N, DO  tiZANidine (ZANAFLEX) 4 MG tablet Take 4 mg by mouth every 8 (eight) hours as needed for muscle spasms.   Yes [provider]  traZODone (DESYREL) 100 MG tablet Take 100-300 mg by mouth at bedtime.   Yes [provider]  valsartan-hydrochlorothiazide (DIOVAN HCT) 80-12.5 MG tablet Take 0.5 tablets by mouth daily. 05/03/23  Yes Marykay Lex, MD  metoprolol succinate (TOPROL-XL) 50 MG 24 hr tablet Take 50 mg by mouth daily. Take with or immediately following a meal.    [provider]    Allergies: Allergies  Allergen Reactions   Cephalexin Hives and Other (See Comments)   Etodolac Hives   Iodinated Contrast Media Hives   Latex Hives and Other (See Comments)   Penicillin G Hives   Fish-Derived Products Other (See Comments)   Iodine Other (See Comments)   Penicillins    Lisinopril Other (See Comments)    Review of Systems: Review of Systems  Constitutional:  Negative for chills and fever.  Respiratory:  Negative for shortness of breath.   Cardiovascular:  Negative for chest pain.  Gastrointestinal:  Negative for nausea and vomiting.  Skin:        Left breast wound healing.    Physical Exam BP (!) 175/81 (BP Location: Right Arm, Patient Position: Sitting, Cuff Size: Large)   Pulse (!) 57   Temp 98.2 F (36.8 C)   Wt 276 lb (125.2 kg)   SpO2 100%   BMI 41.97 kg/m  CONSTITUTIONAL: No acute distress, well nourished. HEENT:  Normocephalic, atraumatic, extraocular motion  intact. RESPIRATORY:  Normal respiratory effort without pathologic use of accessory muscles. CARDIOVASCULAR: Regular rhythm and rate. BREAST: Left breast wound in the upper outer quadrant is not fully healed.  The scar is sunken in but the wound is fully closed.  Left axillary incision is also well-healed.  Overall there are no palpable masses, skin changes between these 2 areas. NEUROLOGIC:  Motor and sensation is grossly normal.  Cranial nerves are grossly intact. PSYCH:  Alert and oriented to person, place and time. Affect is normal.  Assessment and Plan: This is a 57 y.o. female status post left breast lumpectomy and sentinel lymph node biopsy with abscess formation in the lumpectomy site.  - The patient's wound is now fully healed and no further dressings are needed at this point.  The pain that she is  having may be related to the scar tissue that has developed between her lumpectomy, sentinel lymph node biopsy, radiation changes, and infection resulting in I&D.  Depending how she is moving her positioning, that may be causing pulling or discomfort sensation.  For now, recommend that she can start using vitamin E lotion, or cocoa butter lotion to help with softening the scar tissue in that area.  If this is not helping enough, she can also start using over-the-counter medications like over-the-counter lidocaine ointment or patches or Salonpas patches. - Patient will be due for follow-up for her yearly mammogram in November 2024.  Will follow-up on her pain at that point as well.  I spent 20 minutes dedicated to the care of this patient on the date of this encounter to include pre-visit review of records, face-to-face time with the patient discussing diagnosis and management, and any post-visit coordination of care.   Howie Ill, MD [facility]

## 2023-08-08 ENCOUNTER — Ambulatory Visit: Payer: Medicare HMO | Admitting: Radiation Oncology

## 2023-09-17 ENCOUNTER — Ambulatory Visit
Admission: RE | Admit: 2023-09-17 | Discharge: 2023-09-17 | Disposition: A | Discharge status: Home / Self Care | Payer: Medicare HMO | Source: Ambulatory Visit | Attending: Oncology | Admitting: Oncology

## 2023-09-17 DIAGNOSIS — Z79811 Long term (current) use of aromatase inhibitors: Secondary | ICD-10-CM | POA: Diagnosis not present

## 2023-09-17 DIAGNOSIS — C50912 Malignant neoplasm of unspecified site of left female breast: Secondary | ICD-10-CM | POA: Diagnosis present

## 2023-09-17 DIAGNOSIS — Z483 Aftercare following surgery for neoplasm: Secondary | ICD-10-CM | POA: Diagnosis not present

## 2023-09-17 DIAGNOSIS — Z923 Personal history of irradiation: Secondary | ICD-10-CM | POA: Insufficient documentation

## 2023-09-17 DIAGNOSIS — C50412 Malignant neoplasm of upper-outer quadrant of left female breast: Secondary | ICD-10-CM | POA: Diagnosis present

## 2023-10-17 ENCOUNTER — Encounter: Payer: Self-pay | Admitting: Surgery

## 2023-10-17 ENCOUNTER — Ambulatory Visit (INDEPENDENT_AMBULATORY_CARE_PROVIDER_SITE_OTHER): Payer: Medicare HMO | Admitting: Surgery

## 2023-10-17 VITALS — BP 163/95 | HR 57 | Temp 98.7°F | Ht 68.0 in | Wt 281.6 lb

## 2023-10-17 DIAGNOSIS — Z170 Estrogen receptor positive status [ER+]: Secondary | ICD-10-CM | POA: Diagnosis not present

## 2023-10-17 DIAGNOSIS — Z09 Encounter for follow-up examination after completed treatment for conditions other than malignant neoplasm: Secondary | ICD-10-CM

## 2023-10-17 DIAGNOSIS — C50912 Malignant neoplasm of unspecified site of left female breast: Secondary | ICD-10-CM | POA: Diagnosis not present

## 2023-10-17 DIAGNOSIS — N611 Abscess of the breast and nipple: Secondary | ICD-10-CM

## 2023-10-17 NOTE — Patient Instructions (Signed)
 Breast Self-Awareness Breast self-awareness is knowing how your breasts look and feel. You need to: Check your breasts on a regular basis. Tell your doctor about any changes. Become familiar with the look and feel of your breasts. This can help you catch a breast problem while it is still small and can be treated. You should do breast self-exams even if you have breast implants. What you need: A mirror. A well-lit room. A pillow or other soft object. How to do a breast self-exam Follow these steps to do a breast self-exam: Look for changes  Take off all the clothes above your waist. Stand in front of a mirror in a room with good lighting. Put your hands down at your sides. Compare your breasts in the mirror. Look for any difference between them, such as: A difference in shape. A difference in size. Wrinkles, dips, and bumps in one breast and not the other. Look at each breast for changes in the skin, such as: Redness. Scaly areas. Skin that has gotten thicker. Dimpling. Open sores (ulcers). Look for changes in your nipples, such as: Fluid coming out of a nipple. Fluid around a nipple. Bleeding. Dimpling. Redness. A nipple that looks pushed in (retracted), or that has changed position. Feel for changes Lie on your back. Feel each breast. To do this: Pick a breast to feel. Place a pillow under the shoulder closest to that breast. Put the arm closest to that breast behind your head. Feel the nipple area of that breast using the hand of your other arm. Feel the area with the pads of your three middle fingers by making small circles with your fingers. Use light, medium, and firm pressure. Continue the overlapping circles, moving downward over the breast. Keep making circles with your fingers. Stop when you feel your ribs. Start making circles with your fingers again, this time going upward until you reach your collarbone. Then, make circles outward across your breast and into your  armpit area. Squeeze your nipple. Check for discharge and lumps. Repeat these steps to check your other breast. Sit or stand in the tub or shower. With soapy water on your skin, feel each breast the same way you did when you were lying down. Write down what you find Writing down what you find can help you remember what to tell your doctor. Write down: What is normal for each breast. Any changes you find in each breast. These include: The kind of changes you find. A tender or painful breast. Any lump you find. Write down its size and where it is. When you last had your monthly period (menstrual cycle). General tips If you are breastfeeding, the best time to check your breasts is after you feed your baby or after you use a breast pump. If you get monthly bleeding, the best time to check your breasts is 5-7 days after your monthly cycle ends. With time, you will become comfortable with the self-exam. You will also start to know if there are changes in your breasts. Contact a doctor if: You see a change in the shape or size of your breasts or nipples. You see a change in the skin of your breast or nipples, such as red or scaly skin. You have fluid coming from your nipples that is not normal. You find a new lump or thick area. You have breast pain. You have any concerns about your breast health. Summary Breast self-awareness includes looking for changes in your breasts and feeling for changes  within your breasts. You should do breast self-awareness in front of a mirror in a well-lit room. If you get monthly periods (menstrual cycles), the best time to check your breasts is 5-7 days after your period ends. Tell your doctor about any changes you see in your breasts. Changes include changes in size, changes on the skin, painful or tender breasts, or fluid from your nipples that is not normal. This information is not intended to replace advice given to you by your health care provider. Make sure  you discuss any questions you have with your health care provider. Document Revised: 04/20/2022 Document Reviewed: 09/15/2021 Elsevier Patient Education  2024 [employer].

## 2023-10-17 NOTE — Progress Notes (Signed)
10/17/2023  History of Present Illness: Heather Mcintyre is a 57 y.o. female status post left breast lumpectomy and sentinel lymph node biopsy on 11/07/2022 for left breast cancer.  Patient completed radiation therapy and is currently on Arimidex.  She had a complication of an abscess at the site of her lumpectomy which started on 04/11/2023.  The wound has finally healed after I&D and debridement.  She presents today for 1 year follow-up from her surgery.  She had her mammogram done on 09/17/2023 which showed appropriate postoperative changes in the left breast but otherwise no suspicious findings in either breast.  Past Medical History: Past Medical History:  Diagnosis Date   Anxiety    Asthma    Breast cancer (HCC)    Cervical cancer (HCC)    Complication of anesthesia    woke up in the middle of surgery 2010   Degenerative joint disease/osteoarthritis    Depression    GERD (gastroesophageal reflux disease)    Hypertension    Lupus    Pre-diabetes      Past Surgical History: Past Surgical History:  Procedure Laterality Date   ABDOMINAL HYSTERECTOMY     BREAST BIOPSY Left 10/04/2022   [place] LT BREAST BX W LOC DEV 1ST LESION IMG BX SPEC US GUIDE 10/04/2022 [facility]   BREAST LUMPECTOMY,RADIO FREQ LOCALIZER,AXILLARY SENTINEL LYMPH NODE BIOPSY Left 11/07/2022   Procedure: BREAST LUMPECTOMY,RADIO FREQ LOCALIZER,AXILLARY SENTINEL LYMPH NODE BIOPSY;  Surgeon: Henrene Dodge, MD;  Location: [facility];  Service: General;  Laterality: Left;   CHOLECYSTECTOMY N/A    COLONOSCOPY WITH ESOPHAGOGASTRODUODENOSCOPY (EGD)     INCISION AND DRAINAGE ABSCESS Left 04/11/2023   Procedure: INCISION AND DRAINAGE ABSCESS, breast;  Surgeon: Henrene Dodge, MD;  Location: [facility];  Service: General;  Laterality: Left;   JOINT REPLACEMENT     LAPAROSCOPIC GASTRIC BAND REMOVAL WITH LAPAROSCOPIC GASTRIC SLEEVE RESECTION     REPLACEMENT TOTAL KNEE BILATERAL     TUBAL LIGATION      Home Medications: Prior  to Admission medications   Medication Sig Start Date End Date Taking? Authorizing Provider  acetaminophen (TYLENOL) 500 MG tablet Take 2 tablets (1,000 mg total) by mouth every 6 (six) hours as needed for mild pain. 11/07/22  Yes Melyna Huron, MD  ALOE VERA PO Take 1 tablet by mouth daily. Natural aloe   Yes [provider]  anastrozole (ARIMIDEX) 1 MG tablet Take 1 tablet (1 mg total) by mouth daily. 06/28/23  Yes Jeralyn Ruths, MD  ascorbic acid (VITAMIN C) 500 MG tablet Take 500 mg by mouth daily.   Yes [provider]  B COMPLEX VITAMINS PO Take 1 tablet by mouth daily.   Yes [provider]  celecoxib (CELEBREX) 200 MG capsule Take 1 capsule (200 mg total) by mouth 2 (two) times daily as needed for moderate pain. 11/07/22  Yes Otilio Groleau, Elita Quick, MD  cetirizine (ZYRTEC) 10 MG tablet Take 10 mg by mouth daily.   Yes [provider]  colchicine 0.6 MG tablet Take 1 tablet (0.6 mg total) by mouth daily. 01/11/23 01/11/24 Yes Ward, Layla Maw, DO  diphenhydrAMINE (BENADRYL) 25 mg capsule Take 25 mg by mouth every 6 (six) hours as needed.   Yes [provider]  fluticasone (FLONASE) 50 MCG/ACT nasal spray Place 1 spray into both nostrils daily.   Yes [provider]  furosemide (LASIX) 40 MG tablet Take 40 mg by mouth daily as needed.   Yes [provider]  gabapentin (NEURONTIN) 300  MG capsule Take 300 mg by mouth at bedtime. 06/28/16  Yes [provider]  hydrOXYzine (ATARAX) 10 MG tablet Take 10 mg by mouth 2 (two) times daily. 09/26/22  Yes [provider]  indomethacin (INDOCIN) 50 MG capsule Take 1 capsule (50 mg total) by mouth 3 (three) times daily with meals. 01/11/23  Yes Ward, Layla Maw, DO  linaclotide (LINZESS) 290 MCG CAPS capsule Take 290 mcg by mouth daily before breakfast.   Yes [provider]  metoprolol succinate (TOPROL XL) 25 MG 24 hr tablet Take 1 tablet (25 mg total) by mouth daily. 05/03/23   Yes Marykay Lex, MD  montelukast (SINGULAIR) 10 MG tablet Take 10 mg by mouth at bedtime.   Yes [provider]  multivitamin-lutein (OCUVITE-LUTEIN) CAPS capsule Take 1 capsule by mouth as needed.   Yes [provider]  omeprazole (PRILOSEC) 40 MG capsule  09/13/17  Yes [provider]  tiZANidine (ZANAFLEX) 4 MG tablet Take 4 mg by mouth every 8 (eight) hours as needed for muscle spasms.   Yes [provider]  traZODone (DESYREL) 100 MG tablet Take 100-300 mg by mouth at bedtime.   Yes [provider]  valsartan-hydrochlorothiazide (DIOVAN HCT) 80-12.5 MG tablet Take 0.5 tablets by mouth daily. 05/03/23  Yes Marykay Lex, MD  metoprolol succinate (TOPROL-XL) 50 MG 24 hr tablet Take 50 mg by mouth daily. Take with or immediately following a meal.    [provider]    Allergies: Allergies  Allergen Reactions   Cephalexin Hives and Other (See Comments)   Etodolac Hives   Iodinated Contrast Media Hives   Latex Hives and Other (See Comments)   Penicillin G Hives   Fish-Derived Products Other (See Comments)   Iodine Other (See Comments)   Penicillins    Lisinopril Other (See Comments)    Review of Systems: Review of Systems  Constitutional:  Negative for chills and fever.  Respiratory:  Negative for shortness of breath.   Cardiovascular:  Negative for chest pain.  Gastrointestinal:  Negative for nausea and vomiting.  Skin:  Negative for rash.    Physical Exam BP (!) 163/95 (BP Location: Right Arm, Patient Position: Sitting, Cuff Size: Large)   Pulse (!) 57   Temp 98.7 F (37.1 C) (Oral)   Ht 5\' 8"  (1.727 m)   Wt 281 lb 9.6 oz (127.7 kg)   SpO2 97%   BMI 42.82 kg/m  CONSTITUTIONAL: No acute distress HEENT:  Normocephalic, atraumatic, extraocular motion intact. RESPIRATORY:  Normal respiratory effort without pathologic use of accessory muscles. CARDIOVASCULAR: Regular rhythm and rate. BREAST: Left breast status  post lumpectomy in the upper outer quadrant with scar well-healed although dimpled due to contraction and the abscess formation.  No palpable masses, other skin changes, or nipple changes.  Left axillary incision is well-healed without any palpable lymphadenopathy.  Right breast without any palpable masses, skin changes, or nipple changes.  No right axillary lymphadenopathy. NEUROLOGIC:  Motor and sensation is grossly normal.  Cranial nerves are grossly intact. PSYCH:  Alert and oriented to person, place and time. Affect is normal.  Labs/Imaging: Bilateral mammogram on 09/17/2023: FINDINGS: There is density and architectural distortion within the LEFT breast, consistent with postsurgical changes. These are new in comparison to prior. No suspicious mass, distortion, or microcalcifications are identified to suggest presence of malignancy.   IMPRESSION: No mammographic evidence of malignancy bilaterally.   RECOMMENDATION: Recommend bilateral diagnostic mammogram (with RIGHT and LEFT breast ultrasound if deemed  necessary) in 1 year.   I have discussed the findings and recommendations with the patient. If applicable, a reminder letter will be sent to the patient regarding the next appointment.   BI-RADS CATEGORY  2: Benign.  Assessment and Plan: This is a 57 y.o. female status post left breast lumpectomy and sentinel lymph node biopsy followed by I&D of left breast abscess.  - Patient overall is doing well now almost 1 year after her surgery.  Mammogram last month did not show any evidence of malignancy or suspicious findings.  Her left lumpectomy site is now fully healed with dimpled scar likely from contraction.  Otherwise no other issues. --Patient will follow up in 6 months for breast exam.  I spent 20 minutes dedicated to the care of this patient on the date of this encounter to include pre-visit review of records, face-to-face time with the patient discussing diagnosis and management, and  any post-visit coordination of care.   Howie Ill, MD [facility]

## 2023-12-10 ENCOUNTER — Emergency Department: Payer: Medicare HMO

## 2023-12-10 ENCOUNTER — Other Ambulatory Visit: Payer: Self-pay

## 2023-12-10 DIAGNOSIS — I10 Essential (primary) hypertension: Secondary | ICD-10-CM | POA: Diagnosis not present

## 2023-12-10 DIAGNOSIS — R200 Anesthesia of skin: Secondary | ICD-10-CM | POA: Insufficient documentation

## 2023-12-10 DIAGNOSIS — Z5321 Procedure and treatment not carried out due to patient leaving prior to being seen by health care provider: Secondary | ICD-10-CM | POA: Diagnosis not present

## 2023-12-10 LAB — BASIC METABOLIC PANEL
Anion gap: 12 (ref 5–15)
BUN: 19 mg/dL (ref 6–20)
CO2: 27 mmol/L (ref 22–32)
Calcium: 9.6 mg/dL (ref 8.9–10.3)
Chloride: 101 mmol/L (ref 98–111)
Creatinine, Ser: 1.14 mg/dL — ABNORMAL HIGH (ref 0.44–1.00)
GFR, Estimated: 56 mL/min — ABNORMAL LOW (ref >60)
Glucose, Bld: 91 mg/dL (ref 70–99)
Potassium: 3.8 mmol/L (ref 3.5–5.1)
Sodium: 140 mmol/L (ref 135–145)

## 2023-12-10 LAB — CBC
HCT: 41.8 % (ref 36.0–46.0)
Hemoglobin: 13.9 g/dL (ref 12.0–15.0)
MCH: 29.2 pg (ref 26.0–34.0)
MCHC: 33.3 g/dL (ref 30.0–36.0)
MCV: 87.8 fL (ref 80.0–100.0)
Platelets: 308 10*3/uL (ref 150–400)
RBC: 4.76 MIL/uL (ref 3.87–5.11)
RDW: 13.8 % (ref 11.5–15.5)
WBC: 8.9 10*3/uL (ref 4.0–10.5)
nRBC: 0 % (ref 0.0–0.2)

## 2023-12-10 LAB — TROPONIN I (HIGH SENSITIVITY): Troponin I (High Sensitivity): 30 ng/L — ABNORMAL HIGH (ref <18)

## 2023-12-10 NOTE — ED Notes (Signed)
 No answer when called several times from lobby

## 2023-12-10 NOTE — ED Triage Notes (Signed)
 Pt reports a few days ago her PCP switched her BP medication, she has been unable to control her BP since. Pt also reports left arm numbness, speech clear no other neuro deficits at this time. Pt denies chest pain sob.

## 2023-12-10 NOTE — ED Notes (Addendum)
 No answer when called several times from lobby

## 2023-12-11 ENCOUNTER — Emergency Department
Admission: EM | Admit: 2023-12-11 | Discharge: 2023-12-11 | Discharge status: Against Medical Advice | Payer: Medicare HMO | Attending: Emergency Medicine | Admitting: Emergency Medicine

## 2023-12-11 DIAGNOSIS — R200 Anesthesia of skin: Secondary | ICD-10-CM | POA: Diagnosis not present

## 2023-12-11 LAB — TROPONIN I (HIGH SENSITIVITY): Troponin I (High Sensitivity): 26 ng/L — ABNORMAL HIGH (ref <18)

## 2023-12-13 DIAGNOSIS — M47816 Spondylosis without myelopathy or radiculopathy, lumbar region: Secondary | ICD-10-CM | POA: Insufficient documentation

## 2023-12-13 DIAGNOSIS — M48061 Spinal stenosis, lumbar region without neurogenic claudication: Secondary | ICD-10-CM | POA: Insufficient documentation

## 2023-12-13 DIAGNOSIS — Z79899 Other long term (current) drug therapy: Secondary | ICD-10-CM | POA: Insufficient documentation

## 2023-12-13 DIAGNOSIS — M4807 Spinal stenosis, lumbosacral region: Secondary | ICD-10-CM | POA: Insufficient documentation

## 2023-12-13 DIAGNOSIS — G894 Chronic pain syndrome: Secondary | ICD-10-CM | POA: Insufficient documentation

## 2023-12-13 DIAGNOSIS — M899 Disorder of bone, unspecified: Secondary | ICD-10-CM | POA: Insufficient documentation

## 2023-12-13 DIAGNOSIS — Z789 Other specified health status: Secondary | ICD-10-CM | POA: Insufficient documentation

## 2023-12-13 DIAGNOSIS — R937 Abnormal findings on diagnostic imaging of other parts of musculoskeletal system: Secondary | ICD-10-CM | POA: Insufficient documentation

## 2023-12-13 DIAGNOSIS — M4316 Spondylolisthesis, lumbar region: Secondary | ICD-10-CM | POA: Insufficient documentation
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
## 2023-12-13 NOTE — Patient Instructions (Signed)

## 2023-12-17 ENCOUNTER — Ambulatory Visit (HOSPITAL_BASED_OUTPATIENT_CLINIC_OR_DEPARTMENT_OTHER): Payer: Medicare HMO | Admitting: Pain Medicine

## 2023-12-17 ENCOUNTER — Encounter: Payer: Self-pay | Admitting: Pain Medicine

## 2023-12-17 ENCOUNTER — Ambulatory Visit
Admission: RE | Admit: 2023-12-17 | Discharge: 2023-12-17 | Disposition: A | Discharge status: Home / Self Care | Payer: Medicare HMO | Source: Ambulatory Visit | Attending: Pain Medicine | Admitting: Pain Medicine

## 2023-12-17 VITALS — BP 152/61 | HR 58 | Temp 98.0°F | Resp 16 | Ht 68.0 in | Wt 286.0 lb

## 2023-12-17 DIAGNOSIS — M25571 Pain in right ankle and joints of right foot: Secondary | ICD-10-CM | POA: Insufficient documentation

## 2023-12-17 DIAGNOSIS — G8929 Other chronic pain: Secondary | ICD-10-CM | POA: Diagnosis present

## 2023-12-17 DIAGNOSIS — M899 Disorder of bone, unspecified: Secondary | ICD-10-CM | POA: Insufficient documentation

## 2023-12-17 DIAGNOSIS — M47816 Spondylosis without myelopathy or radiculopathy, lumbar region: Secondary | ICD-10-CM | POA: Insufficient documentation

## 2023-12-17 DIAGNOSIS — M4316 Spondylolisthesis, lumbar region: Secondary | ICD-10-CM | POA: Insufficient documentation

## 2023-12-17 DIAGNOSIS — Z79899 Other long term (current) drug therapy: Secondary | ICD-10-CM | POA: Insufficient documentation

## 2023-12-17 DIAGNOSIS — M25572 Pain in left ankle and joints of left foot: Secondary | ICD-10-CM

## 2023-12-17 DIAGNOSIS — G894 Chronic pain syndrome: Secondary | ICD-10-CM

## 2023-12-17 DIAGNOSIS — M47896 Other spondylosis, lumbar region: Secondary | ICD-10-CM

## 2023-12-17 DIAGNOSIS — M25562 Pain in left knee: Secondary | ICD-10-CM | POA: Insufficient documentation

## 2023-12-17 DIAGNOSIS — Z789 Other specified health status: Secondary | ICD-10-CM | POA: Insufficient documentation

## 2023-12-17 DIAGNOSIS — M25561 Pain in right knee: Secondary | ICD-10-CM | POA: Insufficient documentation

## 2023-12-17 DIAGNOSIS — M79605 Pain in left leg: Secondary | ICD-10-CM | POA: Insufficient documentation

## 2023-12-17 DIAGNOSIS — M48061 Spinal stenosis, lumbar region without neurogenic claudication: Secondary | ICD-10-CM | POA: Insufficient documentation

## 2023-12-17 DIAGNOSIS — M4807 Spinal stenosis, lumbosacral region: Secondary | ICD-10-CM

## 2023-12-17 DIAGNOSIS — M25551 Pain in right hip: Secondary | ICD-10-CM

## 2023-12-17 DIAGNOSIS — R937 Abnormal findings on diagnostic imaging of other parts of musculoskeletal system: Secondary | ICD-10-CM

## 2023-12-17 DIAGNOSIS — M47817 Spondylosis without myelopathy or radiculopathy, lumbosacral region: Secondary | ICD-10-CM

## 2023-12-17 DIAGNOSIS — M25552 Pain in left hip: Secondary | ICD-10-CM | POA: Insufficient documentation

## 2023-12-17 DIAGNOSIS — M542 Cervicalgia: Secondary | ICD-10-CM | POA: Insufficient documentation

## 2023-12-17 DIAGNOSIS — M545 Low back pain, unspecified: Secondary | ICD-10-CM

## 2023-12-17 DIAGNOSIS — M79604 Pain in right leg: Secondary | ICD-10-CM | POA: Insufficient documentation

## 2023-12-17 NOTE — Progress Notes (Signed)
Safety precautions to be maintained throughout the outpatient stay will include: orient to surroundings, keep bed in low position, maintain call bell within reach at all times, provide assistance with transfer out of bed and ambulation.  

## 2023-12-19 ENCOUNTER — Encounter: Payer: Self-pay | Admitting: *Deleted

## 2023-12-20 ENCOUNTER — Encounter: Payer: Self-pay | Admitting: Cardiology

## 2023-12-20 ENCOUNTER — Ambulatory Visit: Payer: Medicare HMO | Attending: Cardiology | Admitting: Cardiology

## 2023-12-20 VITALS — BP 146/78 | HR 55 | Ht 68.0 in | Wt 287.0 lb

## 2023-12-20 DIAGNOSIS — Z79899 Other long term (current) drug therapy: Secondary | ICD-10-CM

## 2023-12-20 DIAGNOSIS — I517 Cardiomegaly: Secondary | ICD-10-CM | POA: Diagnosis not present

## 2023-12-20 DIAGNOSIS — Z6841 Body Mass Index (BMI) 40.0 and over, adult: Secondary | ICD-10-CM

## 2023-12-20 DIAGNOSIS — R0789 Other chest pain: Secondary | ICD-10-CM | POA: Diagnosis not present

## 2023-12-20 DIAGNOSIS — I9789 Other postprocedural complications and disorders of the circulatory system, not elsewhere classified: Secondary | ICD-10-CM | POA: Diagnosis not present

## 2023-12-20 DIAGNOSIS — R0609 Other forms of dyspnea: Secondary | ICD-10-CM | POA: Diagnosis not present

## 2023-12-20 DIAGNOSIS — G4710 Hypersomnia, unspecified: Secondary | ICD-10-CM

## 2023-12-20 LAB — COMPLIANCE DRUG ANALYSIS, UR

## 2023-12-20 MED ORDER — VALSARTAN-HYDROCHLOROTHIAZIDE 80-12.5 MG PO TABS: 1.0000 | ORAL_TABLET | Freq: Every day | ORAL | 3 refills | Status: DC

## 2023-12-20 NOTE — Progress Notes (Signed)
Cardiology Office Note:  .   Date:  12/22/2023  ID:  Heather Mcintyre, DOB 15-Jan-1966, MRN [id number] PCP: Emogene Morgan, MD  [facility] Providers Cardiologist:  Bryan Lemma, MD     Chief Complaint  Patient presents with   1 month follow up     Patient was at [facility] ER twice in Jan. 2025. Patient had an elevated troponin level 12/11/2023 at the ER and continues to have chest pressure.    Hypertension    Patient Profile: .     Heather Mcintyre is a morbidly obese 58 y.o. female  with a PMH reviewed below who presents here for 75-month follow-up with plaints of poorly controlled hypertension at the request of Aycock, Ngwe A, MD.  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  She had also noted some heart rates down to the 40s presumably during procedures (apparently during a gynecologic I&D procedure).  At this visit, she felt fine and indicated that she had not and worn a monitor.  She was having radiation treatment for her invasive ductal carcinoma of the left breast.  Other than feeling fatigued she was doing okay.  Subjective  Discussed the use of AI scribe software for clinical note transcription with the patient, who gave verbal consent to proceed.  History of Present Illness   The patient, with a history of hypertension and recent surgery, presents with concerns of persistently elevated blood pressure despite taking Metoprolol 150mg  daily. She reports blood pressure readings as high as 235/100 over the past month, with a recent emergency department visit due to this concern -- there was concern about Tropoin level of 26 (trivial). The patient also notes intermittent chest discomfort in the area of her recent surgical site,  which is suspected to be related to post-surgical scar tissue.  She has been self-monitoring her blood pressure and has increased her Metoprolol dosage in response to high readings.  The patient also reports swelling, predominantly on the left side, and a lack of sensation in the left thigh. She denies any history of stroke. The patient has been experiencing shortness of breath, particularly upon exertion, and has been sleeping with the aid of a surgical pillow due to back pain.  The patient has a history of arthritis, which has been causing significant discomfort and stiffness, particularly in the knees and ankles. She has been managing this with the use of knee braces and a circulation sock. Despite these challenges, the patient remains active at work, although she reports significant pain and stiffness with movement.  The patient has previously tried Valsartan for blood pressure control but discontinued it due to feeling unwell. She is interested in exploring other options for blood pressure management and weight loss, as she recognizes the potential benefits of weight loss on her overall health.     Cardiovascular ROS: positive for - chest pain, dyspnea on exertion, edema, and exercise intolerance, labile blood pressures.  The chest pain is localized at her surgical site in the chest.  Unilateral edema.  Quite deconditioned. negative for - irregular heartbeat, loss of consciousness, orthopnea, palpitations, paroxysmal nocturnal dyspnea, rapid heart rate, shortness of breath, or syncope or near syncope, TIA or emesis BX, claudication  -  Metoprolol Succinate (Toprol XL) 50 mg - taking 150mg  daily    Objective   Studies Reviewed: Marland Kitchen   EKG Interpretation Date/Time:  Thursday December 20 2023 09:03:54 EST Ventricular Rate:  55 PR Interval:  160 QRS Duration:  82 QT Interval:  448 QTC Calculation: 428 R Axis:   1  Text Interpretation: Sinus bradycardia Possible Left atrial enlargement Minimal  voltage criteria for LVH, may be normal variant ( R in aVL ) with repolarization abnormality When compared with ECG of 10-Dec-2023 19:13, No significant change since last tracing Confirmed by Bryan Lemma ([id number]) on 12/20/2023 9:28:37 AM    No results found for: "CHOL", "HDL", "LDLCALC", "LDLDIRECT", "TRIG", "CHOLHDL" Lab Results  Component Value Date   NA 140 12/10/2023   CL 101 12/10/2023   K 3.8 12/10/2023   CO2 27 12/10/2023   BUN 19 12/10/2023   CREATININE 1.14 (H) 12/10/2023   GFRNONAA 56 (L) 12/10/2023   CALCIUM 9.6 12/10/2023   GLUCOSE 91 12/10/2023    Risk Assessment/Calculations:    HYPERTENSION CONTROL Vitals:   12/20/23 0852 12/20/23 0928  BP: (!) 152/80 (!) 146/78    The patient's blood pressure is elevated above target today.  In order to address the patient's elevated BP: A return visit for a blood pressure check with the RN or CMA will be arranged.; Labs and/or other diagnostics are currently pending prior to making blood pressure medication adjustments. (add back valsartan 40-12.5 (start @ 1/2 tgab x 2 wk & increase)           Physical Exam:   VS:  BP (!) 146/78   Pulse (!) 55   Ht 5\' 8"  (1.727 m)   Wt 287 lb (130.2 kg)   SpO2 98%   BMI 43.64 kg/m    Wt Readings from Last 3 Encounters:  12/20/23 287 lb (130.2 kg)  12/17/23 286 lb (129.7 kg)  12/10/23 267 lb (121.1 kg)    GEN: Well nourished, well developed in no acute distress; morbidly obese.  Well-groomed. NECK: No JVD; No carotid bruits CARDIAC: Normal S1, S2; RRR, no murmurs, rubs, gallops; notable tenderness along the left lateral chest/breasts midway between the sternum and the axilla RESPIRATORY:  Clear to auscultation without rales, wheezing or rhonchi ; nonlabored, good air movement. ABDOMEN: Soft, non-tender, non-distended EXTREMITIES:  No edema; No deformity      ASSESSMENT AND PLAN: .    Problem List Items Addressed This Visit       Cardiology Problems   LVH (left ventricular  hypertrophy) (Chronic)   LVH on EKG 40 controlled hypertension and elevated troponin levels. -Order echocardiogram to assess cardiac function in the context of uncontrolled hypertension and reported shortness of breath.      Relevant Medications   metoprolol succinate (TOPROL-XL) 50 MG 24 hr tablet   valsartan-hydrochlorothiazide (DIOVAN HCT) 80-12.5 MG tablet   Other Relevant Orders   ECHOCARDIOGRAM COMPLETE   Uncontrolled hypertension (Chronic)   Uncontrolled, with recent emergency department visit due to high blood pressure.  Patient self-adjusted Metoprolol dose to 150mg  daily due to high readings. -Reduce Metoprolol to 100mg  daily. -Initiate Valsartan/HCTZ (80-12.5 mg) at half dose, with plan to increase to full dose after one week if tolerated. -Check chemistry panel two weeks after starting Valsartan/HCTZ. -Consider addition of Spironolactone if further blood pressure control needed. To rule out as a secondary cause of hypertension. -Order renal artery Doppler ultrasound rule out renal hypertension -With evidence of LVH on EKG, elevated troponin levels will check 2D  echo to evaluate for hypertensive heart disease.      Relevant Medications   metoprolol succinate (TOPROL-XL) 50 MG 24 hr tablet   valsartan-hydrochlorothiazide (DIOVAN HCT) 80-12.5 MG tablet     Other   Anterior chest wall pain   Somewhat reproducible on exam and localized at the site of her previous surgery Likely related to previous breast surgery, with tenderness at surgical site. No exertional symptoms reported.      Bradycardia following surgery - Primary (Chronic)   Bradycardic but on high-dose beta-blocker.  Would like to is back down on her beta-blocker and add an ARB-HCTZ. -Reduce metoprolol dose to 100 mg daily -Would like to further decrease dose if she is able to tolerate ARB-hydrochlorothiazide  (Would actually potentially prefer to convert to carvedilol if we can get her down to a lower dose of  beta-blocker)      Relevant Orders   EKG 12-Lead (Completed)   ECHOCARDIOGRAM COMPLETE   DOE (dyspnea on exertion)   Reported with exertion, in the context of uncontrolled hypertension. -Order echocardiogram to assess cardiac function in the context of uncontrolled hypertension and reported shortness of breath.      Morbid obesity with BMI of 40.0-44.9, adult (HCC) (Chronic)   The patient understands the need to lose weight with diet and exercise. We have discussed specific strategies for this.Patient expressed interest in weight loss medications. -Discuss options such as Zepbound or Wegovy at future visit.      Relevant Medications   BYETTA 5 MCG PEN 5 MCG/0.02ML SOPN injection   Other Visit Diagnoses       Chest pressure       Relevant Orders   EKG 12-Lead (Completed)   ECHOCARDIOGRAM COMPLETE     Medication management       Relevant Orders   Comprehensive metabolic panel   Lipid panel       Follow-up -Return in 3-4 weeks to assess blood pressure control and medication tolerance. -Subsequent follow-up in 2 months with physician.        Follow-Up: Return in about 3 weeks (around 01/10/2024) for Follow-up with APP, 3-4 month follow-up, Routine follow up with me.     Signed, Marykay Lex, MD, MS Bryan Lemma, M.D., M.S. Interventional Cardiologist  [facility]  Pager # [phone] Phone # [phone] [address] [place], [place] [address]

## 2023-12-20 NOTE — Patient Instructions (Signed)
Medication Instructions:  Please start Diovan-HCT 80-12.5 MG take 0.5 tablet daily for 2 weeks and then increase to 1 tablet daily.    *If you need a refill on your cardiac medications before your next appointment, please call your pharmacy*   Lab Work: Your provider would like for you to return in 2 weeks to have the following labs drawn: CMP and Lipids.   Please go to [facility] [address] ([facility]) [address], [place] [address] You do not need an appointment.  They are open from 8 am- 4:30 pm.  Lunch from 1:00 pm- 2:00 pm You will need to be fasting.   You may also go to one of the following [facility]:  [address] [place], [place] [address] Phone: [phone] Lab hours: Mon-Fri 8 am- 5 pm    Lunch 12 pm- 1 pm  [address] [place],  [place]  [address]  [place] Phone: [phone] Lab hours: 7 am- 4 pm Lunch 12 pm-1 pm   [address] [place],  [place]  [address]  [place] Phone: [phone] Lab hours: Mon-Fri 8 am- 5 pm    Lunch 12 pm- 1 pm  If you have labs (blood work) drawn today and your tests are completely normal, you will receive your results only by: MyChart Message (if you have MyChart) OR A paper copy in the mail If you have any lab test that is abnormal or we need to change your treatment, we will call you to review the results.   Testing/Procedures: Your physician has requested that you have an echocardiogram. Echocardiography is a painless test that uses sound waves to create images of your heart. It provides your doctor with information about the size and shape of your heart and how well your heart's chambers and valves are working.   You may receive an ultrasound enhancing agent through an IV if needed to better visualize your heart during the echo. This procedure takes approximately one hour.  There are no restrictions for this procedure.  This will take place at [address] [facility]) [address], [place]  [address]  Please note: We ask at that you not bring children with you during ultrasound (echo/ vascular) testing. Due to room size and safety concerns, children are not allowed in the ultrasound rooms during exams. Our front office staff cannot provide observation of children in our lobby area while testing is being conducted. An adult accompanying a patient to their appointment will only be allowed in the ultrasound room at the discretion of the ultrasound technician under special circumstances. We apologize for any inconvenience.   Your physician has requested that you have a renal artery duplex. During this test, an ultrasound is used to evaluate blood flow to the kidneys. Take your medications as you usually do. This will take place at [address] [facility]) [address], [place] [address].  No food after 11PM the night before.  Water is OK. (Don't drink liquids if you have been instructed not to for ANOTHER test). Avoid foods that produce bowel gas, for 24 hours prior to exam (see below). No breakfast, no chewing gum, no smoking or carbonated beverages. Patient may take morning medications with water. Come in for test at least 15 minutes early to register.  Please note: We ask at that you not bring children with you during ultrasound (echo/ vascular) testing. Due to room size and safety concerns, children are not allowed in the ultrasound rooms during exams. Our  front office staff cannot provide observation of children in our lobby area while testing is being conducted. An adult accompanying a patient to their appointment will only be allowed in the ultrasound room at the discretion of the ultrasound technician under special circumstances. We apologize for any inconvenience.    Follow-Up: At [facility], you and your health needs are our priority.  As part of our continuing mission to provide you with exceptional heart care, we have created designated Provider Care Teams.   These Care Teams include your primary Cardiologist (physician) and Advanced Practice Providers (APPs -  Physician Assistants and Nurse Practitioners) who all work together to provide you with the care you need, when you need it.  We recommend signing up for the patient portal called "MyChart".  Sign up information is provided on this After Visit Summary.  MyChart is used to connect with patients for Virtual Visits (Telemedicine).  Patients are able to view lab/test results, encounter notes, upcoming appointments, etc.  Non-urgent messages can be sent to your provider as well.   To learn more about what you can do with MyChart, go to [email or web address].    Your next appointment:   3 - 4 week(s) with APP 3 month Dr. Herbie Baltimore  Provider:   You may see Bryan Lemma, MD or one of the following Advanced Practice Providers on your designated Care Team:   Nicolasa Ducking, NP Eula Listen, PA-C Cadence Fransico Michael, PA-C Charlsie Quest, NP Carlos Levering, NP

## 2023-12-21 ENCOUNTER — Encounter: Payer: Self-pay | Admitting: Cardiology

## 2023-12-21 DIAGNOSIS — R0789 Other chest pain: Secondary | ICD-10-CM | POA: Insufficient documentation

## 2023-12-21 NOTE — Assessment & Plan Note (Addendum)
Bradycardic but on high-dose beta-blocker.  Would like to is back down on her beta-blocker and add an ARB-HCTZ. -Reduce metoprolol dose to 100 mg daily -Would like to further decrease dose if she is able to tolerate ARB-hydrochlorothiazide  (Would actually potentially prefer to convert to carvedilol if we can get her down to a lower dose of beta-blocker)

## 2023-12-21 NOTE — Assessment & Plan Note (Signed)
Somewhat reproducible on exam and localized at the site of her previous surgery Likely related to previous breast surgery, with tenderness at surgical site. No exertional symptoms reported.

## 2023-12-21 NOTE — Assessment & Plan Note (Addendum)
The patient understands the need to lose weight with diet and exercise. We have discussed specific strategies for this.Patient expressed interest in weight loss medications. -Discuss options such as Zepbound or Wegovy at future visit.

## 2023-12-21 NOTE — Assessment & Plan Note (Signed)
LVH on EKG 40 controlled hypertension and elevated troponin levels. -Order echocardiogram to assess cardiac function in the context of uncontrolled hypertension and reported shortness of breath.

## 2023-12-21 NOTE — Assessment & Plan Note (Signed)
Reported with exertion, in the context of uncontrolled hypertension. -Order echocardiogram to assess cardiac function in the context of uncontrolled hypertension and reported shortness of breath.

## 2023-12-21 NOTE — Assessment & Plan Note (Signed)
Uncontrolled, with recent emergency department visit due to high blood pressure.  Patient self-adjusted Metoprolol dose to 150mg  daily due to high readings. -Reduce Metoprolol to 100mg  daily. -Initiate Valsartan/HCTZ (80-12.5 mg) at half dose, with plan to increase to full dose after one week if tolerated. -Check chemistry panel two weeks after starting Valsartan/HCTZ. -Consider addition of Spironolactone if further blood pressure control needed. To rule out as a secondary cause of hypertension. -Order renal artery Doppler ultrasound rule out renal hypertension -With evidence of LVH on EKG, elevated troponin levels will check 2D echo to evaluate for hypertensive heart disease.

## 2023-12-22 ENCOUNTER — Encounter: Payer: Self-pay | Admitting: Cardiology

## 2023-12-24 ENCOUNTER — Other Ambulatory Visit: Payer: Self-pay | Admitting: Oncology

## 2023-12-27 ENCOUNTER — Emergency Department: Payer: Medicare HMO

## 2023-12-27 ENCOUNTER — Other Ambulatory Visit: Payer: Self-pay

## 2023-12-27 ENCOUNTER — Encounter: Payer: Self-pay | Admitting: Emergency Medicine

## 2023-12-27 ENCOUNTER — Telehealth: Payer: Self-pay | Admitting: Cardiology

## 2023-12-27 ENCOUNTER — Emergency Department
Admission: EM | Admit: 2023-12-27 | Discharge: 2023-12-27 | Disposition: A | Discharge status: Home / Self Care | Payer: Medicare HMO | Attending: Emergency Medicine | Admitting: Emergency Medicine

## 2023-12-27 DIAGNOSIS — R001 Bradycardia, unspecified: Secondary | ICD-10-CM | POA: Diagnosis not present

## 2023-12-27 DIAGNOSIS — R079 Chest pain, unspecified: Secondary | ICD-10-CM | POA: Diagnosis present

## 2023-12-27 DIAGNOSIS — R519 Headache, unspecified: Secondary | ICD-10-CM | POA: Diagnosis not present

## 2023-12-27 DIAGNOSIS — R7989 Other specified abnormal findings of blood chemistry: Secondary | ICD-10-CM | POA: Diagnosis not present

## 2023-12-27 DIAGNOSIS — R0789 Other chest pain: Secondary | ICD-10-CM | POA: Diagnosis present

## 2023-12-27 DIAGNOSIS — I159 Secondary hypertension, unspecified: Secondary | ICD-10-CM | POA: Insufficient documentation

## 2023-12-27 DIAGNOSIS — I10 Essential (primary) hypertension: Secondary | ICD-10-CM

## 2023-12-27 DIAGNOSIS — Z853 Personal history of malignant neoplasm of breast: Secondary | ICD-10-CM | POA: Insufficient documentation

## 2023-12-27 LAB — URINALYSIS, ROUTINE W REFLEX MICROSCOPIC
Bacteria, UA: NONE SEEN
Bilirubin Urine: NEGATIVE
Glucose, UA: NEGATIVE mg/dL
Ketones, ur: NEGATIVE mg/dL
Leukocytes,Ua: NEGATIVE
Nitrite: NEGATIVE
Protein, ur: 30 mg/dL — AB
Specific Gravity, Urine: 1.028 (ref 1.005–1.030)
pH: 5 (ref 5.0–8.0)

## 2023-12-27 LAB — CBC
HCT: 39.8 % (ref 36.0–46.0)
Hemoglobin: 13.4 g/dL (ref 12.0–15.0)
MCH: 28.8 pg (ref 26.0–34.0)
MCHC: 33.7 g/dL (ref 30.0–36.0)
MCV: 85.4 fL (ref 80.0–100.0)
Platelets: 294 10*3/uL (ref 150–400)
RBC: 4.66 MIL/uL (ref 3.87–5.11)
RDW: 14.2 % (ref 11.5–15.5)
WBC: 6.6 10*3/uL (ref 4.0–10.5)
nRBC: 0 % (ref 0.0–0.2)

## 2023-12-27 LAB — D-DIMER, QUANTITATIVE: D-Dimer, Quant: 1.94 ug{FEU}/mL — ABNORMAL HIGH (ref 0.00–0.50)

## 2023-12-27 LAB — BASIC METABOLIC PANEL
Anion gap: 10 (ref 5–15)
BUN: 18 mg/dL (ref 6–20)
CO2: 26 mmol/L (ref 22–32)
Calcium: 9.5 mg/dL (ref 8.9–10.3)
Chloride: 104 mmol/L (ref 98–111)
Creatinine, Ser: 0.93 mg/dL (ref 0.44–1.00)
GFR, Estimated: >60 mL/min (ref >60)
Glucose, Bld: 87 mg/dL (ref 70–99)
Potassium: 3.9 mmol/L (ref 3.5–5.1)
Sodium: 140 mmol/L (ref 135–145)

## 2023-12-27 LAB — TROPONIN I (HIGH SENSITIVITY)
Troponin I (High Sensitivity): 36 ng/L — ABNORMAL HIGH (ref <18)
Troponin I (High Sensitivity): 35 ng/L — ABNORMAL HIGH (ref <18)
Troponin I (High Sensitivity): 33 ng/L — ABNORMAL HIGH (ref <18)

## 2023-12-27 MED ORDER — IRBESARTAN 75 MG PO TABS
75.0000 mg | ORAL_TABLET | Freq: Once | ORAL | Status: AC
  Administered 2023-12-27: 75 mg via ORAL
  Filled 2023-12-27: qty 1

## 2023-12-27 MED ORDER — ASPIRIN 81 MG PO CHEW
324.0000 mg | CHEWABLE_TABLET | Freq: Once | ORAL | Status: AC
  Administered 2023-12-27: 324 mg via ORAL
  Filled 2023-12-27: qty 4

## 2023-12-27 MED ORDER — METOPROLOL SUCCINATE ER 100 MG PO TB24: 100.0000 mg | ORAL_TABLET | Freq: Every day | ORAL | 3 refills | Status: DC

## 2023-12-27 MED ORDER — HYDROCHLOROTHIAZIDE 12.5 MG PO TABS
12.5000 mg | ORAL_TABLET | Freq: Once | ORAL | Status: AC
  Administered 2023-12-27: 12.5 mg via ORAL
  Filled 2023-12-27: qty 1

## 2023-12-27 MED ORDER — TECHNETIUM TO 99M ALBUMIN AGGREGATED
4.0000 | Freq: Once | INTRAVENOUS | Status: AC | PRN
  Administered 2023-12-27: 4.2 via INTRAVENOUS

## 2023-12-27 MED ORDER — VALSARTAN-HYDROCHLOROTHIAZIDE 80-12.5 MG PO TABS: 1.0000 | ORAL_TABLET | Freq: Every day | ORAL | 3 refills | Status: DC

## 2023-12-27 NOTE — ED Provider Notes (Signed)
-----------------------------------------   3:14 PM on 12/27/2023 -----------------------------------------  Blood pressure (!) 174/75, pulse (!) 50, temperature 98.5 F (36.9 C), temperature source Oral, resp. rate 18, height 5\' 8"  (1.727 m), weight 129.7 kg, SpO2 99%.  Assuming care from Dr. Arnoldo Morale.  In short, Heather Mcintyre is a 58 y.o. female with a chief complaint of Hypertension .  Refer to the original H&P for additional details.  The current plan of care is to follow-up perfusion scan results.  ----------------------------------------- 8:00 PM on 12/27/2023 ----------------------------------------- Perfusion imaging is unremarkable, low suspicion for PE at this time.  Case discussed with hospitalist for admission given concern for high risk chest pain.  She was evaluated by Dr. Huel Cote of the hospitalist service, chest pain seems atypical for ACS and troponin has been stable.  Patient did start higher dose of metoprolol, likely contributing to her bradycardia, but symptoms seem unlikely to be related to bradycardia.  Hospitalist ordered dose of patient's new blood pressure medication prescribed by cardiology, blood pressure now continues to improve.  Third set of troponin is once again stable, hospitalist recommends discharge home with cardiology follow-up, which patient has scheduled along with repeat echocardiogram.  I discussed this with the patient, who is comfortable going home for now and returning to the ED for any new or worsening symptoms.         Chesley Noon, MD 12/27/23 2002

## 2023-12-27 NOTE — Assessment & Plan Note (Addendum)
In the setting of high dose metoprolol.  - Cautioned patient to no longer take double dose of metoprolol (300 mg taken in past 24 hours) - Per cardiology notes, decrease dose to 100 mg daily

## 2023-12-27 NOTE — ED Triage Notes (Signed)
Pt states that she has been having issues with her bp, states that her meds were changed again, states yesterday her bp was 203/105, 200/89 today, states that her head feels tight, having left lower side pain, pt is scheduled for an echo feb 14th, occasionally she feels a pinpoint pressing sensation in her chest

## 2023-12-27 NOTE — Assessment & Plan Note (Signed)
Mildly elevated troponin with a flat trend and continued stability.  No ischemic appearing changes on EKG.

## 2023-12-27 NOTE — Assessment & Plan Note (Signed)
Patient is presenting with severe hypertension with associated mild headache that has since resolved.  Per chart review, outpatient blood pressure has ranged between systolic 149 - 175.    Discussed with on-call heart care cardiologist.  Recommended starting antihypertensives here in the ED and rechecking a third troponin.  Otherwise, symptoms are nonspecific and given stability, no need for urgent/emergent cardiac workup.  Her blood pressure has gradually improved since arriving to the ED without any antihypertensive therapy.  It is now back to her baseline range.  Will give a dose of irbesartan given valsartan is not on formulary and HCTZ with instructions to pick up her prescription from the pharmacy tomorrow and start tomorrow.  - S/p one-time dose irbesartan 75 mg and HCTZ 12.5 mg - Start outpatient valsartan-HCTZ tomorrow - Keep cardiology office visit in 2 weeks.  Will send staff message to Dr. Herbie Baltimore

## 2023-12-27 NOTE — Consult Note (Signed)
Initial Consultation Note   Patient: Heather Mcintyre DOB: [birth year] PCP: Heather Morgan, MD DOA: 12/27/2023 DOS: the patient was seen and examined on 12/27/2023 Primary service: Heather Noon, MD  Referring physician: Dr. Larinda Mcintyre Reason for consult: Hypertension and chest pain  Assessment/Plan:  Severe Hypertension Patient is presenting with severe hypertension with associated mild headache that has since resolved.  Per chart review, outpatient blood pressure has ranged between systolic 149 - 175.    Discussed with on-call heart care cardiologist.  Recommended starting antihypertensives here in the ED and rechecking a third troponin.  Otherwise, symptoms are nonspecific and given stability, no need for urgent/emergent cardiac workup.  Her blood pressure has gradually improved since arriving to the ED without any antihypertensive therapy.  It is now back to her baseline range.  Will give a dose of irbesartan given valsartan is not on formulary and HCTZ with instructions to pick up her prescription from the pharmacy tomorrow and start tomorrow.  - S/p one-time dose irbesartan 75 mg and HCTZ 12.5 mg - Start outpatient valsartan-HCTZ tomorrow - Keep cardiology office visit in 2 weeks.  Will send staff message to Heather Mcintyre  Atypical chest pain Patient has been experiencing several weeks of atypical chest pain described as a pressure like sensation.  It is occasionally brought on by exertion but not relieved by rest.  Troponins today are reassuring as is her EKG.  Low suspicion for ACS at this time.  - Outpatient echocardiogram scheduled in 2 weeks - Keep cardiology office visit in 2 weeks  Elevated troponin Mildly elevated troponin with a flat trend and continued stability.  No ischemic appearing changes on EKG.  Sinus bradycardia In the setting of high dose metoprolol.  - Cautioned patient to no longer take double dose of metoprolol (300 mg taken in past 24 hours) -  Per cardiology notes, decrease dose to 100 mg daily   [facility] will sign off at present, please call us again when needed.  HPI: Heather Mcintyre is a 58 y.o. female with past medical history of Uncontrolled hypertension, invasive ductal carcinoma of the left breast s/p resection and radiation therapy, chronic pain syndrome, prediabetes, who presents to the ED due to chest pain.  Per chart review, patient was recently seen by cardiology on 12/19/2022 at which time she had noted on and off chest pain described as a pressure-like sensation in addition to dyspnea on exertion.  She was started on valsartan-HCTZ and her metoprolol was reduced to 100 mg daily.  Patient states she was not able to pick this up from the pharmacy yet and remains on metoprolol 150 mg daily.  [name] states that she had her blood pressure checked at work and noted it to be high.  She subsequently developed a dull right-sided headache and was concerned that this was due to her blood pressure.  She had checked it multiple times at home and remains significantly elevated.  Due to this, she took an extra dose of metoprolol 150 mg at home with some improvement in her symptoms.  However, due to continued blood pressure elevation, she came to the ED today.  She endorses continued chest pressure and dyspnea on exertion that is unchanged in nature.  Exertion makes her symptoms worse but rest does not necessarily make it better.  She notes that since she is arrived to the ED, her symptoms are overall improved.  ED course: On arrival to the ED, patient was hypertensive at 196/88 with heart  rate of 50.  She was saturating at 100% on room air.  She was afebrile at 98.5.  Blood pressure subsequently decreased to 159/96 after receiving aspirin only.  Initial workup notable for unremarkable BMP and CBC.  Troponin mildly elevated at 36 with downtrend to 35 and then 33.  D-dimer elevated, VQ scan obtained and negative.  CT of the head was obtained  and negative.  Urinalysis with proteinuria.  Due to concern of elevated blood pressure and chest pressure, [facility] contacted for admission.  Review of Systems: As mentioned in the history of present illness. All other systems reviewed and are negative.  Past Medical History:  Diagnosis Date   Anxiety    Asthma    Breast cancer (HCC)    Cervical cancer (HCC)    Complication of anesthesia    woke up in the middle of surgery 2010   Degenerative joint disease/osteoarthritis    Depression    GERD (gastroesophageal reflux disease)    Hypertension    Lupus    Pre-diabetes    Past Surgical History:  Procedure Laterality Date   ABDOMINAL HYSTERECTOMY     BREAST BIOPSY Left 10/04/2022   [place] LT BREAST BX W LOC DEV 1ST LESION IMG BX SPEC US GUIDE 10/04/2022 [facility]   BREAST LUMPECTOMY,RADIO FREQ LOCALIZER,AXILLARY SENTINEL LYMPH NODE BIOPSY Left 11/07/2022   Procedure: BREAST LUMPECTOMY,RADIO FREQ LOCALIZER,AXILLARY SENTINEL LYMPH NODE BIOPSY;  Surgeon: Henrene Dodge, MD;  Location: [facility];  Service: General;  Laterality: Left;   CHOLECYSTECTOMY N/A    COLONOSCOPY WITH ESOPHAGOGASTRODUODENOSCOPY (EGD)     INCISION AND DRAINAGE ABSCESS Left 04/11/2023   Procedure: INCISION AND DRAINAGE ABSCESS, breast;  Surgeon: Henrene Dodge, MD;  Location: [facility];  Service: General;  Laterality: Left;   JOINT REPLACEMENT     LAPAROSCOPIC GASTRIC BAND REMOVAL WITH LAPAROSCOPIC GASTRIC SLEEVE RESECTION     REPLACEMENT TOTAL KNEE BILATERAL     TUBAL LIGATION     Social History:  reports that she has never smoked. She has never been exposed to tobacco smoke. She has never used smokeless tobacco. She reports that she does not drink alcohol and does not use drugs.  Allergies  Allergen Reactions   Cephalexin Hives and Other (See Comments)   Etodolac Hives   Iodinated Contrast Media Hives   Latex Hives and Other (See Comments)   Penicillin G Hives   Fish-Derived Products Other (See Comments)   Iodine  Other (See Comments)   Penicillins    Lisinopril Other (See Comments)    Family History  Problem Relation Age of Onset   Hypertension Mother    Hypertension Father    Breast cancer Neg Hx     Prior to Admission medications   Medication Sig Start Date End Date Taking? Authorizing Provider  acetaminophen (TYLENOL) 500 MG tablet Take 2 tablets (1,000 mg total) by mouth every 6 (six) hours as needed for mild pain. 11/07/22   Piscoya, Elita Quick, MD  ALOE VERA PO Take 1 tablet by mouth daily. Natural aloe    [provider]  anastrozole (ARIMIDEX) 1 MG tablet TAKE 1 TABLET(1 MG) BY MOUTH DAILY 12/24/23   Jeralyn Ruths, MD  ascorbic acid (VITAMIN C) 500 MG tablet Take 500 mg by mouth daily.    [provider]  B COMPLEX VITAMINS PO Take 1 tablet by mouth daily.    [provider]  BYETTA 5 MCG PEN 5 MCG/0.02ML SOPN injection Inject 5 mcg into the skin 2 (two) times daily  with a meal. 12/06/23   [provider]  celecoxib (CELEBREX) 200 MG capsule Take 1 capsule (200 mg total) by mouth 2 (two) times daily as needed for moderate pain. 11/07/22   Henrene Dodge, MD  cetirizine (ZYRTEC) 10 MG tablet Take 10 mg by mouth daily.    [provider]  colchicine 0.6 MG tablet Take 1 tablet (0.6 mg total) by mouth daily. 01/11/23 01/11/24  Ward, Layla Maw, DO  diphenhydrAMINE (BENADRYL) 25 mg capsule Take 25 mg by mouth every 6 (six) hours as needed.    [provider]  fluticasone (FLONASE) 50 MCG/ACT nasal spray Place 1 spray into both nostrils daily.    [provider]  furosemide (LASIX) 40 MG tablet Take 40 mg by mouth daily as needed.    [provider]  gabapentin (NEURONTIN) 300 MG capsule Take 300 mg by mouth at bedtime. 06/28/16   [provider]  hydrOXYzine (ATARAX) 10 MG tablet Take 10 mg by mouth 2 (two) times daily. 09/26/22   [provider]  indomethacin (INDOCIN) 50 MG capsule Take 1 capsule (50 mg total)  by mouth 3 (three) times daily with meals. 01/11/23   Ward, Layla Maw, DO  linaclotide (LINZESS) 290 MCG CAPS capsule Take 290 mcg by mouth daily before breakfast.    [provider]  metoprolol succinate (TOPROL-XL) 100 MG 24 hr tablet Take 1 tablet (100 mg total) by mouth daily. 12/27/23   Marykay Lex, MD  montelukast (SINGULAIR) 10 MG tablet Take 10 mg by mouth at bedtime.    [provider]  multivitamin-lutein (OCUVITE-LUTEIN) CAPS capsule Take 1 capsule by mouth as needed.    [provider]  omeprazole (PRILOSEC) 40 MG capsule PRN 09/13/17   [provider]  tiZANidine (ZANAFLEX) 4 MG tablet Take 4 mg by mouth every 8 (eight) hours as needed for muscle spasms.    [provider]  valsartan-hydrochlorothiazide (DIOVAN HCT) 80-12.5 MG tablet Take 1 tablet by mouth daily. 12/27/23   Marykay Lex, MD  metoprolol succinate (TOPROL-XL) 50 MG 24 hr tablet Take 50 mg by mouth daily. Take with or immediately following a meal. Patient not taking: No sig reported    [provider]   Physical Exam: Vitals:   12/27/23 1104 12/27/23 1503 12/27/23 1750 12/27/23 1812  BP:  (!) 174/75 133/84 (!) 159/96  Pulse:  (!) 50 (!) 58 (!) 49  Resp:  18 18 18   Temp:   98 F (36.7 C)   TempSrc:   Oral Esophageal  SpO2:  99% 99% 99%  Weight: 129.7 kg     Height: 5\' 8"  (1.727 m)      Physical Exam Vitals and nursing note reviewed.  Constitutional:      General: She is not in acute distress.    Appearance: She is obese.  Cardiovascular:     Rate and Rhythm: Regular rhythm. Bradycardia present.     Heart sounds: No murmur heard.    No gallop.  Pulmonary:     Effort: Pulmonary effort is normal. No respiratory distress.     Breath sounds: Normal breath sounds. No wheezing, rhonchi or rales.  Abdominal:     General: Bowel sounds are normal.     Palpations: Abdomen is soft.  Musculoskeletal:     Right lower leg: No edema.     Left lower leg: No  edema.  Skin:    General: Skin is warm and dry.  Neurological:  Mental Status: She is alert and oriented to person, place, and time. Mental status is at baseline.  Psychiatric:        Mood and Affect: Mood normal.        Behavior: Behavior normal.    Data Reviewed:  CBC with WBC of 6.6, hemoglobin of 13.4, MCV of 85.4, platelets of 294 BMP with sodium of 140, potassium 3.9, bicarb 26, BUN 18, creatinine 0.93 with GFR above 60 Troponin 36, 35 and then 33 D-dimer 1.94 Urinalysis with moderate hematuria and proteinuria  EKG personally reviewed.  Sinus rhythm with a rate of 52.  No axis deviation.  No ischemic appearing changes.  NM Pulmonary Perfusion Result Date: 12/27/2023 CLINICAL DATA:  Positive D-dimer.  Concern for pulmonary edema. EXAM: NUCLEAR MEDICINE PERFUSION LUNG SCAN TECHNIQUE: Perfusion images were obtained in multiple projections after intravenous injection of radiopharmaceutical. Ventilation scans intentionally deferred if perfusion scan and chest x-ray adequate for interpretation during COVID 19 epidemic. RADIOPHARMACEUTICALS:  4.2 mCi Tc-29m MAA IV COMPARISON:  Chest radiograph dated 12/27/2023. FINDINGS: There is uniform perfusion of the lungs.  No perfusion defect noted. IMPRESSION: Normal perfusion scan. Electronically Signed   By: Elgie Collard M.D.   On: 12/27/2023 16:13   CT Head Wo Contrast Result Date: 12/27/2023 CLINICAL DATA:  Headache.  Hypertension. EXAM: CT HEAD WITHOUT CONTRAST TECHNIQUE: Contiguous axial images were obtained from the base of the skull through the vertex without intravenous contrast. RADIATION DOSE REDUCTION: This exam was performed according to the departmental dose-optimization program which includes automated exposure control, adjustment of the mA and/or kV according to patient size and/or use of iterative reconstruction technique. COMPARISON:  Head CT dated 12/11/2023. FINDINGS: Brain: The ventricles and sulci are appropriate size for the  patient's age. The gray-white matter discrimination is preserved. There is no acute intracranial hemorrhage. No mass effect or midline shift. No extra-axial fluid collection. Vascular: No hyperdense vessel or unexpected calcification. Skull: Normal. Negative for fracture or focal lesion. Sinuses/Orbits: No acute finding. Other: None IMPRESSION: No acute intracranial pathology. Electronically Signed   By: Elgie Collard M.D.   On: 12/27/2023 14:40   DG Chest 2 View Result Date: 12/27/2023 CLINICAL DATA:  High blood pressure.  Left lower quadrant pain. EXAM: CHEST - 2 VIEW COMPARISON:  12/10/2023 FINDINGS: Normal heart size. No pleural fluid, interstitial edema, or airspace consolidation. No acute osseous findings. IMPRESSION: No acute cardiopulmonary disease. Electronically Signed   By: Signa Kell M.D.   On: 12/27/2023 11:49   There are no new results to review at this time.   Family Communication: No family at bedside Primary team communication: Discussed with Heather Mcintyre  Thank you very much for involving [place] in the care of your patient.  Author: Verdene Lennert, MD 12/27/2023 7:55 PM  For on call review [email or web address].

## 2023-12-27 NOTE — Telephone Encounter (Signed)
Pt c/o BP issue: STAT if pt c/o blurred vision, one-sided weakness or slurred speech  1. What are your last 5 BP readings? 202/165   2. Are you having any other symptoms (ex. Dizziness, headache, blurred vision, passed out)? Headache   3. What is your BP issue? Pt called in stating she has not received new BP medication and her bp is now up. Call transferred to triage

## 2023-12-27 NOTE — ED Provider Triage Note (Signed)
Emergency Medicine Provider Triage Evaluation Note  Heather Mcintyre , a 58 y.o. female  was evaluated in triage.  Pt complains of  high blood pressure.  States that her head is "tight".   LLQ pain.  Denies CP  Review of Systems  Positive: + HTN uncontrolled Negative: No CP, SOB or difficulty breathing  Physical Exam  There were no vitals taken for this visit. Gen:   Awake, no distress   Resp:  Normal effort,  Lungs clear MSK:   Moves extremities without difficulty  Other:    Medical Decision Making  Medically screening exam initiated at 11:02 AM.  Appropriate orders placed.  Heather Mcintyre was informed that the remainder of the evaluation will be completed by another provider, this initial triage assessment does not replace that evaluation, and the importance of remaining in the ED until their evaluation is complete.     Tommi Rumps, PA-C 12/27/23 1114

## 2023-12-27 NOTE — Telephone Encounter (Signed)
Spoke with patient and reviewed Dr. Elissa Hefty recommendations. Also confirmed scheduled appointment coming soon. She verbalized understanding and is currently enroute to ED.

## 2023-12-27 NOTE — Telephone Encounter (Signed)
Called [facility] ([facility]), He stated they did received the Valsartan-hydrochlorothiazide and they will call pt once its ready

## 2023-12-27 NOTE — ED Notes (Signed)
Called [facility] to placw pt on cardiac monitoring

## 2023-12-27 NOTE — Assessment & Plan Note (Signed)
Patient has been experiencing several weeks of atypical chest pain described as a pressure like sensation.  It is occasionally brought on by exertion but not relieved by rest.  Troponins today are reassuring as is her EKG.  Low suspicion for ACS at this time.  - Outpatient echocardiogram scheduled in 2 weeks - Keep cardiology office visit in 2 weeks

## 2023-12-27 NOTE — Telephone Encounter (Signed)
Patient called in with elevated blood pressures and unable to get her BP medication. She states that [facility] did not have prescription on file. Reviewed that order was sent on 1/23 to [facility] and that I would call to confirm receipt with them.   Blood pressure readings are unsafely high. Instructed her to proceed to ED for further assessment and treatment. She verbalized understanding with no further questions at this time.   She reports taking Metoprolol 125 mg once daily and that she has not been able to get her medication at [facility]. Advised will update provider.   Verbal discussion with provider in regards to her blood pressure reading and medications.  Recommendations by provider are that she should take the following:  Metoprolol 100 mg once daily Valsartan-hydrochlorothiazide 80-12.5 mg once daily   Will call patient back to review these updates.

## 2023-12-27 NOTE — ED Provider Notes (Signed)
[facility] Provider Note    Event Date/Time   First MD Initiated Contact with Patient 12/27/23 1236     (approximate)   History   Hypertension   HPI  Heather Mcintyre is a 58 y.o. female past medical history significant for hypertension, obesity, history of breast cancer, presents to the emergency department with shortness of breath and chest pain.  Patient states that she has been having shortness of breath and chest pain that has been ongoing and she has not been feeling well for the past 3 weeks.  States that when she checks her blood pressure it has been significantly elevated.  She does take metoprolol and 2 other antihypertensive medications.  States that she was recently evaluated and they switched her to Cardizem for metoprolol but she is just now able to get that medication and has not picked it up yet.  States that today she had chest pressure that felt like something was sitting on her chest which made her checked her blood pressure and come into the emergency department.  Endorses some shortness of breath that is mild.  States that she has been having intermittent episodes of chest pain and shortness of breath over the past 3 weeks.  Worse with exertion but does not believe that it gets any better at rest.  Initially thought that it was acid reflux.  Denies any active chest pain at this time.  No history of DVT or PE.  In remission from breast cancer from 1 year ago.  Does endorse some swelling to the left side of her body.  No recent travel.  Denies significant cough.  No prior stress testing or cardiac catheterization.  Denies tobacco use, alcohol use or marijuana use.  Also complaining of headache to the right side of her head.  Denies any falls or trauma.  No sudden onset of headache.  No severe headache.     Physical Exam   Triage Vital Signs: ED Triage Vitals  Encounter Vitals Group     BP 12/27/23 1103 (!) 196/88     Systolic BP Percentile --       Diastolic BP Percentile --      Pulse Rate 12/27/23 1103 (!) 50     Resp 12/27/23 1103 16     Temp 12/27/23 1103 98.5 F (36.9 C)     Temp Source 12/27/23 1103 Oral     SpO2 12/27/23 1103 100 %     Weight 12/27/23 1104 286 lb (129.7 kg)     Height 12/27/23 1104 5\' 8"  (1.727 m)     Head Circumference --      Peak Flow --      Pain Score 12/27/23 1104 8     Pain Loc --      Pain Education --      Exclude from Growth Chart --     Most recent vital signs: Vitals:   12/27/23 1103 12/27/23 1503  BP: (!) 196/88 (!) 174/75  Pulse: (!) 50 (!) 50  Resp: 16 18  Temp: 98.5 F (36.9 C)   SpO2: 100% 99%    Physical Exam Constitutional:      Appearance: She is well-developed.  HENT:     Head: Atraumatic.  Eyes:     Conjunctiva/sclera: Conjunctivae normal.  Cardiovascular:     Rate and Rhythm: Regular rhythm.  Pulmonary:     Effort: No respiratory distress.  Abdominal:     General: There is no distension.  Tenderness: There is no abdominal tenderness.  Musculoskeletal:        General: Normal range of motion.     Cervical back: Normal range of motion.     Right lower leg: Edema present.     Left lower leg: No edema.  Skin:    General: Skin is warm.     Capillary Refill: Capillary refill takes less than 2 seconds.  Neurological:     Mental Status: She is alert. Mental status is at baseline.  Psychiatric:        Mood and Affect: Mood normal.     IMPRESSION / MDM / ASSESSMENT AND PLAN / ED COURSE  I reviewed the triage vital signs and the nursing notes.  On chart review patient has recently been evaluated by cardiology and is on 3 antihypertensive medications.  No prior cardiac catheterization or stress testing on my evaluation.  On arrival patient hypertensive with a blood pressure of 186/88.  Differential diagnosis including hypertensive emergency, ACS, pulmonary embolism, pulmonary edema, new onset heart failure  EKG  I, Corena Herter, the attending physician,  personally viewed and interpreted this ECG.  Sinus bradycardia.  Normal intervals.  No chamber enlargement.  No significant ST elevation or depression.  No signs of acute ischemia or dysrhythmia.   RADIOLOGY I independently reviewed imaging, my interpretation of imaging: Chest x-ray with no signs of pneumonia VQ scan is pending  LABS (all labs ordered are listed, but only abnormal results are displayed) Labs interpreted as -    Labs Reviewed  URINALYSIS, ROUTINE W REFLEX MICROSCOPIC - Abnormal; Notable for the following components:      Result Value   Color, Urine YELLOW (*)    APPearance HAZY (*)    Hgb urine dipstick MODERATE (*)    Protein, ur 30 (*)    All other components within normal limits  D-DIMER, QUANTITATIVE - Abnormal; Notable for the following components:   D-Dimer, Quant 1.94 (*)    All other components within normal limits  TROPONIN I (HIGH SENSITIVITY) - Abnormal; Notable for the following components:   Troponin I (High Sensitivity) 36 (*)    All other components within normal limits  TROPONIN I (HIGH SENSITIVITY) - Abnormal; Notable for the following components:   Troponin I (High Sensitivity) 35 (*)    All other components within normal limits  BASIC METABOLIC PANEL  CBC     MDM  EKG with no findings of acute ischemia or dysrhythmia.  Patient was given aspirin given her chest pressure and is currently chest pain-free.  Initial troponin is elevated at 36.  Does have a mildly chronically elevated troponin but I do not see that she has any prior workup with cardiac catheterization or stress testing.  Does have a heart score of 5.  I can see that she has a echocardiogram ordered in the future.  Low risk Wells criteria, D-dimer is positive.  Patient has an allergy to contrast dye.  Ordered VQ scan to further risk ratified and evaluate for pulmonary embolism.  Currently VQ scan is pending.  Care transferred to incoming provider.  If positive will admit for further  evaluation for pulmonary embolism otherwise we will offer admission for possible ACS workup.     PROCEDURES:  Critical Care performed: No  Procedures  Patient's presentation is most consistent with acute presentation with potential threat to life or bodily function.   MEDICATIONS ORDERED IN ED: Medications  aspirin chewable tablet 324 mg (324 mg Oral Given 12/27/23  1351)  technetium albumin aggregated (MAA) injection solution 4 millicurie (4.2 millicuries Intravenous Contrast Given 12/27/23 1517)    FINAL CLINICAL IMPRESSION(S) / ED DIAGNOSES   Final diagnoses:  Secondary hypertension  Chest pain, unspecified type     Rx / DC Orders   ED Discharge Orders     None        Note:  This document was prepared using Dragon voice recognition software and may include unintentional dictation errors.   Corena Herter, MD 12/27/23 1544

## 2024-01-07 ENCOUNTER — Ambulatory Visit: Payer: Medicare HMO | Admitting: Pain Medicine

## 2024-01-07 NOTE — Progress Notes (Unsigned)
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    hydrOXYzine (ATARAX) 10 MG tablet, Take 10 mg by mouth 2 (two) times daily., Disp: , Rfl:    indomethacin (INDOCIN) 50 MG capsule, Take 1 capsule (50 mg total) by mouth 3 (three) times daily with meals., Disp: 21 capsule, Rfl: 0   linaclotide (LINZESS) 290 MCG CAPS capsule, Take 290 mcg by mouth daily before breakfast., Disp: , Rfl:    metoprolol succinate (TOPROL-XL) 100 MG 24 hr tablet, Take 1 tablet (100 mg total) by mouth daily., Disp: 90 tablet, Rfl: 3   montelukast (SINGULAIR) 10 MG tablet, Take 10 mg by mouth at bedtime., Disp: , Rfl:    multivitamin-lutein (OCUVITE-LUTEIN) CAPS capsule, Take 1 capsule by mouth as needed., Disp: , Rfl:    omeprazole (PRILOSEC) 40 MG capsule, PRN, Disp: , Rfl:    tiZANidine (ZANAFLEX) 4 MG tablet, Take 4 mg by mouth every 8 (eight) hours as needed for muscle  spasms., Disp: , Rfl:    valsartan-hydrochlorothiazide (DIOVAN HCT) 80-12.5 MG tablet, Take 1 tablet by mouth daily., Disp: 90 tablet, Rfl: 3  ROS  Constitutional: Denies any fever or chills Gastrointestinal: No reported hemesis, hematochezia, vomiting, or acute GI distress Musculoskeletal: Denies any acute onset joint swelling, redness, loss of ROM, or weakness Neurological: No reported episodes  of acute onset apraxia, aphasia, dysarthria, agnosia, amnesia, paralysis, loss of coordination, or loss of consciousness  Allergies  Heather Mcintyre is allergic to cephalexin, etodolac, iodinated contrast media, latex, penicillin g, fish-derived products, iodine, penicillins, and lisinopril.  PFSH  Drug: Heather Mcintyre  reports no history of drug use. Alcohol:  reports no history of alcohol use. Tobacco:  reports that she has never smoked. She has never been exposed to tobacco smoke. She has never used smokeless tobacco. Medical:  has a past medical history of Anxiety, Asthma, Breast cancer (HCC), Cervical cancer (HCC), Complication of anesthesia, Degenerative joint disease/osteoarthritis, Depression, GERD (gastroesophageal reflux disease), Hypertension, Lupus, and Pre-diabetes. 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 Family: family history includes Hypertension in her father and mother.  Constitutional Exam  General appearance: Well nourished, well developed, and well hydrated. In no apparent acute distress There were no vitals filed for this visit. BMI  Assessment: Estimated body mass index is 43.49 kg/m as calculated from the following:   Height as of 12/27/23: 5\' 8"  (1.727 m).   Weight as of 12/27/23: 286 lb (129.7 kg).  BMI interpretation table: BMI level Category Range association with higher incidence of chronic pain  <18 kg/m2 Underweight   18.5-24.9 kg/m2 Ideal body weight   25-29.9 kg/m2 Overweight Increased incidence by 20%  30-34.9 kg/m2 Obese (Class I) Increased incidence by 68%  35-39.9 kg/m2 Severe obesity (Class II) Increased incidence by 136%  >40 kg/m2 Extreme obesity (Class III) Increased incidence by 254%   Patient's current BMI Ideal Body weight  There is no height or weight on file to calculate BMI. Ideal body weight: 63.9 kg (140 lb 14 oz) Adjusted ideal body weight: 90.2 kg (198 lb 14.8 oz)   BMI Readings from Last 4 Encounters:  12/27/23 43.49 kg/m  12/20/23 43.64 kg/m  12/17/23 43.49 kg/m  12/10/23 40.60 kg/m   Wt Readings from Last 4 Encounters:  12/27/23 286 lb (129.7 kg)  12/20/23 287 lb (130.2 kg)  12/17/23 286 lb (129.7 kg)  12/10/23 267 lb (121.1 kg)    Psych/Mental status: Alert, oriented x 3 (person, place, & time)       Eyes: PERLA Respiratory: No evidence of acute respiratory distress  Assessment & Plan  Primary Diagnosis & Pertinent Problem List: The primary encounter diagnosis was Chronic low back pain (1ry area of Pain) (Bilateral) (L>R) w/o sciatica. Diagnoses of Chronic lower extremity pain (2ry area of Pain) (Bilateral), Chronic hip pain (3ry area of Pain) (Bilateral), Chronic knee pain (4th area of Pain) (Bilateral), Chronic ankle pain (5th area of Pain) (Bilateral), and Chronic neck pain (6th area of Pain) (Bilateral) were also pertinent to this visit. Visit Diagnosis: 1. Chronic low back pain (1ry area of Pain) (Bilateral) (L>R) w/o sciatica   2. Chronic lower extremity pain (2ry area of Pain) (Bilateral)   3. Chronic hip pain (3ry area of Pain) (Bilateral)   4. Chronic knee pain  (4th area of Pain) (Bilateral)   5. Chronic ankle pain (5th area of Pain) (Bilateral)   6. Chronic neck pain (6th area of Pain) (Bilateral)    Problems updated and reviewed during this visit: No problems updated.  Plan of Care  Assessment and  Plan             Pharmacotherapy (Medications Ordered): No orders of the defined types were placed in this encounter.  Procedure Orders    No procedure(s) ordered today   Lab Orders  No laboratory test(s) ordered today   Imaging Orders  No imaging studies ordered today   Referral Orders  No referral(s) requested today    Pharmacological management:  Opioid Analgesics: I will not be prescribing any opioids at this time Membrane stabilizer: I will not be prescribing any at this time Muscle relaxant: I will not be prescribing any at this time NSAID: I will not be prescribing any at this time Other analgesic(s): I will not be prescribing any at this time      Interventional Therapies  Risk Factors  Considerations  Medical Comorbidities:  ALLERGY: Contrast, Latex, Iodine, NSAIDS, PCN  MO (BMI>40)  GERD  HTN  Hx. Bradycardia     Planned  Pending:      Under consideration:   Pending   Completed:   None at this time   Therapeutic  Palliative (PRN) options:   None established   Completed by other providers:   None reported     Provider-requested follow-up: No follow-ups on file. Recent Visits Date Type Provider Dept  12/17/23 Office Visit Delano Metz, MD [facility]  Showing recent visits within past 90 days and meeting all other requirements Future Appointments Date Type Provider Dept  01/09/24 Appointment Delano Metz, MD [facility]  Showing future appointments within next 90 days and meeting all other requirements   Primary Care Physician: Emogene Morgan, MD  Duration of encounter: *** minutes.  Total time on encounter, as per AMA guidelines included both the  face-to-face and non-face-to-face time personally spent by the physician and/or other qualified health care professional(s) on the day of the encounter (includes time in activities that require the physician or other qualified health care professional and does not include time in activities normally performed by clinical staff). Physician's time may include the following activities when performed: Preparing to see the patient (e.g., pre-charting review of records, searching for previously ordered imaging, lab work, and nerve conduction tests) Review of prior analgesic pharmacotherapies. Reviewing PMP Interpreting ordered tests (e.g., lab work, imaging, nerve conduction tests) Performing post-procedure evaluations, including interpretation of diagnostic procedures Obtaining and/or reviewing separately obtained history Performing a medically appropriate examination and/or evaluation Counseling and educating the patient/family/caregiver Ordering medications, tests, or procedures Referring and communicating with other health care professionals (when not separately reported) Documenting clinical information in the electronic or other health record Independently interpreting results (not separately reported) and communicating results to the patient/ family/caregiver Care coordination (not separately reported)  Note by: Oswaldo Done, MD (TTS technology used. I apologize for any typographical errors that were not detected and corrected.) Date: 01/09/2024; Time: 8:01 AM

## 2024-01-08 NOTE — Patient Instructions (Incomplete)
______________________________________________________________________    Procedure instructions  Stop blood-thinners  Do not eat or drink fluids (other than water) for 6 hours before your procedure  No water for 2 hours before your procedure  Take your blood pressure medicine with a sip of water  Arrive 30 minutes before your appointment  If sedation is planned, bring suitable driver. Pennie Banter, Benedetto Goad, & public transportation are NOT APPROVED)  Carefully read the "Preparing for your procedure" detailed instructions  If you have questions call us at [phone]  Procedure appointments are for procedures only. NO medication refills or new problem evaluations.   ______________________________________________________________________      ______________________________________________________________________    Preparing for your procedure  Appointments: If you think you may not be able to keep your appointment, call 24-48 hours in advance to cancel. We need time to make it available to others.  Procedure visits are for procedures only. During your procedure appointment there will be: NO Prescription Refills*. NO medication changes or discussions*. NO discussion of disability issues*. NO unrelated pain problem evaluations*. NO evaluations to order other pain procedures*. *These will be addressed at a separate and distinct evaluation encounter on the provider's evaluation schedule and not during procedure days.  Instructions: Food intake: Avoid eating anything solid for at least 8 hours prior to your procedure. Clear liquid intake: You may take clear liquids such as water up to 2 hours prior to your procedure. (No carbonated drinks. No soda.) Transportation: Unless otherwise stated by your physician, bring a driver. (Driver cannot be a Market researcher, Pharmacist, community, or any other form of public transportation.) Morning Medicines: Except for blood thinners, take all of your other morning  medications with a sip of water. Make sure to take your heart and blood pressure medicines. If your blood pressure's lower number is above 100, the case will be rescheduled. Blood thinners: Make sure to stop your blood thinners as instructed.  If you take a blood thinner, but were not instructed to stop it, call our office [phone] and ask to talk to a nurse. Not stopping a blood thinner prior to certain procedures could lead to serious complications. Diabetics on insulin: Notify the staff so that you can be scheduled 1st case in the morning. If your diabetes requires high dose insulin, take only  of your normal insulin dose the morning of the procedure and notify the staff that you have done so. Preventing infections: Shower with an antibacterial soap the morning of your procedure.  Build-up your immune system: Take 1000 mg of Vitamin C with every meal (3 times a day) the day prior to your procedure. Antibiotics: Inform the nursing staff if you are taking any antibiotics or if you have any conditions that may require antibiotics prior to procedures. (Example: recent joint implants)   Pregnancy: If you are pregnant make sure to notify the nursing staff. Not doing so may result in injury to the fetus, including death.  Sickness: If you have a cold, fever, or any active infections, call and cancel or reschedule your procedure. Receiving steroids while having an infection may result in complications. Arrival: You must be in the facility at least 30 minutes prior to your scheduled procedure. Tardiness: Your scheduled time is also the cutoff time. If you do not arrive at least 15 minutes prior to your procedure, you will be rescheduled.  Children: Do not bring any children with you. Make arrangements to keep them home. Dress appropriately: There is always a possibility that your clothing may get  soiled. Avoid long dresses. Valuables: Do not bring any jewelry or valuables.  Reasons to call and  reschedule or cancel your procedure: (Following these recommendations will minimize the risk of a serious complication.) Surgeries: Avoid having procedures within 2 weeks of any surgery. (Avoid for 2 weeks before or after any surgery). Flu Shots: Avoid having procedures within 2 weeks of a flu shots or . (Avoid for 2 weeks before or after immunizations). Barium: Avoid having a procedure within 7-10 days after having had a radiological study involving the use of radiological contrast. (Myelograms, Barium swallow or enema study). Heart attacks: Avoid any elective procedures or surgeries for the initial 6 months after a "Myocardial Infarction" (Heart Attack). Blood thinners: It is imperative that you stop these medications before procedures. Let us know if you if you take any blood thinner.  Infection: Avoid procedures during or within two weeks of an infection (including chest colds or gastrointestinal problems). Symptoms associated with infections include: Localized redness, fever, chills, night sweats or profuse sweating, burning sensation when voiding, cough, congestion, stuffiness, runny nose, sore throat, diarrhea, nausea, vomiting, cold or Flu symptoms, recent or current infections. It is specially important if the infection is over the area that we intend to treat. Heart and lung problems: Symptoms that may suggest an active cardiopulmonary problem include: cough, chest pain, breathing difficulties or shortness of breath, dizziness, ankle swelling, uncontrolled high or unusually low blood pressure, and/or palpitations. If you are experiencing any of these symptoms, cancel your procedure and contact your primary care physician for an evaluation.  Remember:  Regular Business hours are:  Monday to Thursday 8:00 AM to 4:00 PM  Provider's Schedule: Delano Metz, MD:  Procedure days: Tuesday and Thursday 7:30 AM to 4:00 PM  Edward Jolly, MD:  Procedure days: Monday and Wednesday 7:30 AM to 4:00  PM Last  Updated: 11/06/2023 ______________________________________________________________________      ______________________________________________________________________    General Risks and Possible Complications  Patient Responsibilities: It is important that you read this as it is part of your informed consent. It is our duty to inform you of the risks and possible complications associated with treatments offered to you. It is your responsibility as a patient to read this and to ask questions about anything that is not clear or that you believe was not covered in this document.  Patient's Rights: You have the right to refuse treatment. You also have the right to change your mind, even after initially having agreed to have the treatment done. However, under this last option, if you wait until the last second to change your mind, you may be charged for the materials used up to that point.  Introduction: Medicine is not an Visual merchandiser. Everything in Medicine, including the lack of treatment(s), carries the potential for danger, harm, or loss (which is by definition: Risk). In Medicine, a complication is a secondary problem, condition, or disease that can aggravate an already existing one. All treatments carry the risk of possible complications. The fact that a side effects or complications occurs, does not imply that the treatment was conducted incorrectly. It must be clearly understood that these can happen even when everything is done following the highest safety standards.  No treatment: You can choose not to proceed with the proposed treatment alternative. The "PRO(s)" would include: avoiding the risk of complications associated with the therapy. The "CON(s)" would include: not getting any of the treatment benefits. These benefits fall under one of three categories: diagnostic; therapeutic; and/or  palliative. Diagnostic benefits include: getting information which can ultimately lead to  improvement of the disease or symptom(s). Therapeutic benefits are those associated with the successful treatment of the disease. Finally, palliative benefits are those related to the decrease of the primary symptoms, without necessarily curing the condition (example: decreasing the pain from a flare-up of a chronic condition, such as incurable terminal cancer).  General Risks and Complications: These are associated to most interventional treatments. They can occur alone, or in combination. They fall under one of the following six (6) categories: no benefit or worsening of symptoms; bleeding; infection; nerve damage; allergic reactions; and/or death. No benefits or worsening of symptoms: In Medicine there are no guarantees, only probabilities. No healthcare provider can ever guarantee that a medical treatment will work, they can only state the probability that it may. Furthermore, there is always the possibility that the condition may worsen, either directly, or indirectly, as a consequence of the treatment. Bleeding: This is more common if the patient is taking a blood thinner, either prescription or over the counter (example: Goody Powders, Fish oil, Aspirin, Garlic, etc.), or if suffering a condition associated with impaired coagulation (example: Hemophilia, cirrhosis of the liver, low platelet counts, etc.). However, even if you do not have one on these, it can still happen. If you have any of these conditions, or take one of these drugs, make sure to notify your treating physician. Infection: This is more common in patients with a compromised immune system, either due to disease (example: diabetes, cancer, human immunodeficiency virus [HIV], etc.), or due to medications or treatments (example: therapies used to treat cancer and rheumatological diseases). However, even if you do not have one on these, it can still happen. If you have any of these conditions, or take one of these drugs, make sure to notify  your treating physician. Nerve Damage: This is more common when the treatment is an invasive one, but it can also happen with the use of medications, such as those used in the treatment of cancer. The damage can occur to small secondary nerves, or to large primary ones, such as those in the spinal cord and brain. This damage may be temporary or permanent and it may lead to impairments that can range from temporary numbness to permanent paralysis and/or brain death. Allergic Reactions: Any time a substance or material comes in contact with our body, there is the possibility of an allergic reaction. These can range from a mild skin rash (contact dermatitis) to a severe systemic reaction (anaphylactic reaction), which can result in death. Death: In general, any medical intervention can result in death, most of the time due to an unforeseen complication. ______________________________________________________________________     ____________________________________________________________________________________________  Spondylolisthesis  Spondylolisthesis is a condition that occurs when a vertebra in the spine slips out of place, usually in the lower back. Symptoms can vary from mild to severe, and a person may have no symptoms.  Some common symptoms include:  Back pain, especially chronic pain  Pain that radiates down the legs  Pain that worsens with exercise  Tightness in the hamstrings  Neck stiffness  Loss of spine flexibility  Weakness in the legs or trouble walking  Numbness and tingling in the groin and/or buttocks   Some causes of spondylolisthesis include: Birth defects. Sudden injury. Abnormal wear on the cartilage and bones, such as arthritis. Bone disease and fractures. Certain sports activities, such as gymnastics, weightlifting, and football.  A doctor can diagnose spondylolisthesis with a physical  exam, X-rays, and possibly a CT scan.    Forward slippage is known as  "Anterolisthesis".  Backward slippage is known as "Retrolisthesis".   Pathophysiology of Spondylolisthesis:   Grading Classification of Spondylolisthesis Grade I spondylolisthesis is 1 to 25% slippage, grade II is up to 50% slippage, grade III is up to 75% slippage, and grade IV is 76-100% slippage. If there is more than 100% slippage, it is known as spondyloptosis or grade V spondylolisthesis.       ____________________________________________________________________________________________

## 2024-01-09 ENCOUNTER — Ambulatory Visit: Payer: Medicare HMO | Attending: Pain Medicine | Admitting: Pain Medicine

## 2024-01-09 ENCOUNTER — Encounter: Payer: Self-pay | Admitting: Pain Medicine

## 2024-01-09 VITALS — BP 158/80 | HR 55 | Temp 97.3°F | Resp 16 | Ht 68.0 in | Wt 278.0 lb

## 2024-01-09 DIAGNOSIS — M545 Low back pain, unspecified: Secondary | ICD-10-CM | POA: Diagnosis present

## 2024-01-09 DIAGNOSIS — M25561 Pain in right knee: Secondary | ICD-10-CM | POA: Insufficient documentation

## 2024-01-09 DIAGNOSIS — G8929 Other chronic pain: Secondary | ICD-10-CM | POA: Insufficient documentation

## 2024-01-09 DIAGNOSIS — M542 Cervicalgia: Secondary | ICD-10-CM | POA: Insufficient documentation

## 2024-01-09 DIAGNOSIS — M25572 Pain in left ankle and joints of left foot: Secondary | ICD-10-CM | POA: Diagnosis present

## 2024-01-09 DIAGNOSIS — Z789 Other specified health status: Secondary | ICD-10-CM | POA: Insufficient documentation

## 2024-01-09 DIAGNOSIS — M25562 Pain in left knee: Secondary | ICD-10-CM | POA: Diagnosis present

## 2024-01-09 DIAGNOSIS — M5459 Other low back pain: Secondary | ICD-10-CM | POA: Diagnosis present

## 2024-01-09 DIAGNOSIS — M79605 Pain in left leg: Secondary | ICD-10-CM | POA: Diagnosis present

## 2024-01-09 DIAGNOSIS — M25551 Pain in right hip: Secondary | ICD-10-CM | POA: Insufficient documentation

## 2024-01-09 DIAGNOSIS — Z79899 Other long term (current) drug therapy: Secondary | ICD-10-CM | POA: Diagnosis present

## 2024-01-09 DIAGNOSIS — G894 Chronic pain syndrome: Secondary | ICD-10-CM | POA: Diagnosis present

## 2024-01-09 DIAGNOSIS — M25571 Pain in right ankle and joints of right foot: Secondary | ICD-10-CM | POA: Insufficient documentation

## 2024-01-09 DIAGNOSIS — M47817 Spondylosis without myelopathy or radiculopathy, lumbosacral region: Secondary | ICD-10-CM | POA: Diagnosis present

## 2024-01-09 DIAGNOSIS — M47816 Spondylosis without myelopathy or radiculopathy, lumbar region: Secondary | ICD-10-CM | POA: Insufficient documentation

## 2024-01-09 DIAGNOSIS — M899 Disorder of bone, unspecified: Secondary | ICD-10-CM | POA: Diagnosis present

## 2024-01-09 DIAGNOSIS — M25552 Pain in left hip: Secondary | ICD-10-CM | POA: Insufficient documentation

## 2024-01-09 DIAGNOSIS — M79604 Pain in right leg: Secondary | ICD-10-CM | POA: Diagnosis not present

## 2024-01-09 DIAGNOSIS — M4316 Spondylolisthesis, lumbar region: Secondary | ICD-10-CM | POA: Diagnosis present

## 2024-01-09 NOTE — Progress Notes (Signed)
Safety precautions to be maintained throughout the outpatient stay will include: orient to surroundings, keep bed in low position, maintain call bell within reach at all times, provide assistance with transfer out of bed and ambulation.

## 2024-01-09 NOTE — Progress Notes (Deleted)
   Cardiology Clinic Note   Date: 01/09/2024 ID: Heather Mcintyre, DOB January 03, 1966, MRN [id number]  Primary Cardiologist:  Bryan Lemma, MD  Chief Complaint   Heather Mcintyre is a 58 y.o. female who presents to the clinic today for ***  Patient Profile   Heather Mcintyre is followed by Dr. Herbie Baltimore for the history outlined below.      Past medical history significant for: Dyspnea/chest pain. Hypertension.    In summary, ***  Patient was last seen in the office by ***     History of Present Illness    Today, patient ***  ***  ROS: All other systems reviewed and are otherwise negative except as noted in History of Present Illness.  EKGs/Labs Reviewed        12/27/2023: BUN 18; Creatinine, Ser 0.93; Potassium 3.9; Sodium 140   12/27/2023: Hemoglobin 13.4; WBC 6.6   No results found for requested labs within last 365 days.   No results found for requested labs within last 365 days.  ***  Risk Assessment/Calculations    {Does this patient have ATRIAL FIBRILLATION?:[id number]}          Physical Exam    VS:  There were no vitals taken for this visit. , BMI There is no height or weight on file to calculate BMI.  GEN: Well nourished, well developed, in no acute distress. Neck: No JVD or carotid bruits. Cardiac: *** RRR. No murmurs. No rubs or gallops.   Respiratory:  Respirations regular and unlabored. Clear to auscultation without rales, wheezing or rhonchi. GI: Soft, nontender, nondistended. Extremities: Radials/DP/PT 2+ and equal bilaterally. No clubbing or cyanosis. No edema ***  Skin: Warm and dry, no rash. Neuro: Strength intact.  Assessment & Plan   ***  Disposition: ***     {Are you ordering a CV Procedure (e.g. stress test, cath, DCCV, TEE, etc)?   Press F2        :[id number]}   Signed, Etta Grandchild. Emery Dupuy, DNP, NP-C

## 2024-01-10 ENCOUNTER — Other Ambulatory Visit: Payer: Self-pay | Admitting: Cardiology

## 2024-01-10 DIAGNOSIS — I9789 Other postprocedural complications and disorders of the circulatory system, not elsewhere classified: Secondary | ICD-10-CM

## 2024-01-10 DIAGNOSIS — G4710 Hypersomnia, unspecified: Secondary | ICD-10-CM

## 2024-01-10 DIAGNOSIS — I517 Cardiomegaly: Secondary | ICD-10-CM

## 2024-01-10 DIAGNOSIS — Z79899 Other long term (current) drug therapy: Secondary | ICD-10-CM

## 2024-01-10 DIAGNOSIS — R0609 Other forms of dyspnea: Secondary | ICD-10-CM

## 2024-01-10 DIAGNOSIS — I10 Essential (primary) hypertension: Secondary | ICD-10-CM

## 2024-01-10 DIAGNOSIS — R0789 Other chest pain: Secondary | ICD-10-CM

## 2024-01-11 ENCOUNTER — Ambulatory Visit (INDEPENDENT_AMBULATORY_CARE_PROVIDER_SITE_OTHER): Payer: Medicare HMO

## 2024-01-11 ENCOUNTER — Ambulatory Visit: Payer: Medicare HMO | Admitting: Student

## 2024-01-11 ENCOUNTER — Ambulatory Visit: Payer: Medicare HMO | Attending: Cardiology

## 2024-01-11 DIAGNOSIS — I10 Essential (primary) hypertension: Secondary | ICD-10-CM | POA: Diagnosis not present

## 2024-01-11 DIAGNOSIS — I9789 Other postprocedural complications and disorders of the circulatory system, not elsewhere classified: Secondary | ICD-10-CM

## 2024-01-11 DIAGNOSIS — G4710 Hypersomnia, unspecified: Secondary | ICD-10-CM | POA: Diagnosis not present

## 2024-01-11 DIAGNOSIS — R0789 Other chest pain: Secondary | ICD-10-CM | POA: Diagnosis not present

## 2024-01-11 DIAGNOSIS — I517 Cardiomegaly: Secondary | ICD-10-CM | POA: Diagnosis not present

## 2024-01-11 LAB — ECHOCARDIOGRAM COMPLETE
AR max vel: 3.66 cm2
AV Area VTI: 3.28 cm2
AV Area mean vel: 3.41 cm2
AV Mean grad: 2 mm[Hg]
AV Peak grad: 4.5 mm[Hg]
Ao pk vel: 1.06 m/s
Area-P 1/2: 3.65 cm2
Calc EF: 50.3 %
S' Lateral: 3.3 cm
Single Plane A2C EF: 52.2 %
Single Plane A4C EF: 53.5 %

## 2024-01-12 ENCOUNTER — Encounter: Payer: Self-pay | Admitting: Cardiology

## 2024-01-14 ENCOUNTER — Inpatient Hospital Stay: Payer: Medicare HMO | Admitting: Oncology

## 2024-01-15 ENCOUNTER — Other Ambulatory Visit: Payer: Self-pay

## 2024-01-15 DIAGNOSIS — Z91040 Latex allergy status: Secondary | ICD-10-CM | POA: Insufficient documentation

## 2024-01-15 DIAGNOSIS — Z853 Personal history of malignant neoplasm of breast: Secondary | ICD-10-CM | POA: Diagnosis not present

## 2024-01-15 DIAGNOSIS — Z79899 Other long term (current) drug therapy: Secondary | ICD-10-CM | POA: Diagnosis not present

## 2024-01-15 DIAGNOSIS — I10 Essential (primary) hypertension: Secondary | ICD-10-CM | POA: Diagnosis not present

## 2024-01-15 DIAGNOSIS — J45909 Unspecified asthma, uncomplicated: Secondary | ICD-10-CM | POA: Insufficient documentation

## 2024-01-15 DIAGNOSIS — L500 Allergic urticaria: Secondary | ICD-10-CM | POA: Diagnosis not present

## 2024-01-15 DIAGNOSIS — T7840XA Allergy, unspecified, initial encounter: Secondary | ICD-10-CM | POA: Diagnosis present

## 2024-01-15 NOTE — ED Triage Notes (Signed)
Pt reports itching all over, pt states she was here earlier as an outpatient for a scan and was given something IV, pt is unsure if this is what is causing her reaction. Denies difficulty breathing or swallowing, Pt took benadryl last at 1730.

## 2024-01-16 ENCOUNTER — Emergency Department
Admission: EM | Admit: 2024-01-16 | Discharge: 2024-01-16 | Disposition: A | Discharge status: Home / Self Care | Payer: Medicare HMO | Attending: Emergency Medicine | Admitting: Emergency Medicine

## 2024-01-16 DIAGNOSIS — L500 Allergic urticaria: Secondary | ICD-10-CM | POA: Diagnosis not present

## 2024-01-16 DIAGNOSIS — T7840XA Allergy, unspecified, initial encounter: Secondary | ICD-10-CM

## 2024-01-16 DIAGNOSIS — L509 Urticaria, unspecified: Secondary | ICD-10-CM

## 2024-01-16 LAB — VITAMIN B12: Vitamin B-12: 591 pg/mL (ref 232–1245)

## 2024-01-16 LAB — C-REACTIVE PROTEIN: CRP: 2 mg/L (ref 0–10)

## 2024-01-16 LAB — 25-HYDROXY VITAMIN D LCMS D2+D3
25-Hydroxy, Vitamin D-2: <1 ng/mL
25-Hydroxy, Vitamin D-3: 26 ng/mL
25-Hydroxy, Vitamin D: 26 ng/mL — ABNORMAL LOW

## 2024-01-16 LAB — SEDIMENTATION RATE: Sed Rate: 31 mm/h (ref 0–40)

## 2024-01-16 LAB — MAGNESIUM: Magnesium: 1.8 mg/dL (ref 1.6–2.3)

## 2024-01-16 MED ORDER — METHYLPREDNISOLONE SODIUM SUCC 125 MG IJ SOLR
125.0000 mg | Freq: Once | INTRAMUSCULAR | Status: AC
  Administered 2024-01-16: 125 mg via INTRAMUSCULAR
  Filled 2024-01-16: qty 2

## 2024-01-16 MED ORDER — FAMOTIDINE 20 MG PO TABS
20.0000 mg | ORAL_TABLET | Freq: Once | ORAL | Status: AC
Start: 2024-01-16 — End: 2024-01-16
  Administered 2024-01-16: 20 mg via ORAL
  Filled 2024-01-16: qty 1

## 2024-01-16 MED ORDER — HYDROXYZINE HCL 25 MG PO TABS
50.0000 mg | ORAL_TABLET | Freq: Once | ORAL | Status: AC
  Administered 2024-01-16: 50 mg via ORAL
  Filled 2024-01-16: qty 2

## 2024-01-16 MED ORDER — PREDNISONE 20 MG PO TABS: ORAL_TABLET | ORAL | 0 refills | Status: DC

## 2024-01-16 MED ORDER — FAMOTIDINE 20 MG PO TABS: 20.0000 mg | ORAL_TABLET | Freq: Two times a day (BID) | ORAL | 0 refills | Status: DC

## 2024-01-16 NOTE — ED Notes (Signed)
 ED Provider at bedside.

## 2024-01-16 NOTE — ED Provider Notes (Signed)
[facility] Provider Note    Event Date/Time   First MD Initiated Contact with Patient 01/16/24 0222     (approximate)   History   Allergic Reaction   HPI  Heather Mcintyre is a 58 y.o. female who presents to the ED from home with a chief complaint of allergic reaction and itching.  Patient had outpatient CT scan with IV contrast and noted diffuse hives with itching afterwards.  Last took Benadryl around 5:30 PM.  Denies new medicines, foods or environmental exposures.  Denies airway difficulty, chest pain, shortness of breath, nausea, vomiting or dizziness.     Past Medical History   Past Medical History:  Diagnosis Date   Anxiety    Asthma    Breast cancer (HCC)    Cervical cancer (HCC)    Complication of anesthesia    woke up in the middle of surgery 2010   Degenerative joint disease/osteoarthritis    Depression    GERD (gastroesophageal reflux disease)    Hypertension    Lupus    Pre-diabetes      Active Problem List   Patient Active Problem List   Diagnosis Date Noted   Lumbar facet joint pain 01/09/2024   Lumbar facet joint syndrome 01/09/2024   Spondylosis without myelopathy or radiculopathy, lumbosacral region 01/09/2024   Severe Hypertension 12/27/2023   Elevated troponin 12/27/2023   Sinus bradycardia 12/27/2023   Atypical chest pain 12/21/2023   DOE (dyspnea on exertion) 12/20/2023   LVH (left ventricular hypertrophy) 12/20/2023   Lumbosacral facet arthropathy 12/17/2023   Chronic lower extremity pain (2ry area of Pain) (Bilateral) 12/17/2023   Chronic hip pain (3ry area of Pain) (Bilateral) 12/17/2023   Chronic knee pain (4th area of Pain) (Bilateral) 12/17/2023   Chronic ankle pain (5th area of Pain) (Bilateral) 12/17/2023   Chronic neck pain (6th area of Pain) (Bilateral) 12/17/2023   Chronic pain syndrome 12/13/2023   Pharmacologic therapy 12/13/2023   Disorder of skeletal system 12/13/2023   Problems influencing  health status 12/13/2023   Abnormal MRI, lumbar spine (03/14/2019) 12/13/2023   Lumbar facet hypertrophy (Multilevel) (Bilateral) 12/13/2023   Lumbar foraminal stenosis (Multilevel) (Bilateral) 12/13/2023   Lumbosacral foraminal stenosis (Severe) (L5-S1) (Bilateral) 12/13/2023   Grade 1 Anterolisthesis of lumbar spine (L4/L5) 12/13/2023   Osteoarthritis of facet joint of lumbar spine 12/13/2023   Osteoarthritis of lumbar spine 12/13/2023   Left breast abscess 04/11/2023   Bradycardia following surgery 12/13/2022   Acquired talipes planus, left 11/24/2022   Lumbar spondylosis 11/24/2022   Mixed anxiety and depressive disorder 11/24/2022   Obesity 11/24/2022   Osteoarthritis 11/24/2022   Osteoarthritis of ankle 11/24/2022   Malignant neoplasm of left breast in female, estrogen receptor positive (HCC) 11/07/2022   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 [facility]) 10/10/2022   Spondylolisthesis, lumbar region 05/22/2022   Arthropathy of lumbar facet joint 05/22/2022   Sacroiliac joint pain 05/22/2022   Pain in joint of shoulder (Left) 04/27/2021   Generalized edema 02/23/2021   GERD (gastroesophageal reflux disease) 02/23/2021   Chronic low back pain (1ry area of Pain) (Bilateral) (L>R) w/o sciatica 02/04/2019   Pes planovalgus, acquired (Left) 01/15/2019   Pes planovalgus, acquired (Right) 01/15/2019   Radiculopathy of lumbar region 01/15/2019   Uncontrolled hypertension 08/02/2017   Weight gain status post gastric bypass 08/02/2017   Chronic joint pain 06/28/2016   History of knee replacement (Bilateral) 06/28/2016   Morbid obesity with BMI of 40.0-44.9, adult (HCC) 06/28/2016  Abnormal laboratory test result 06/28/2016     Past Surgical History   Past Surgical History:  Procedure Laterality Date   ABDOMINAL HYSTERECTOMY     BREAST BIOPSY Left 10/04/2022   [place] LT BREAST BX W LOC DEV 1ST LESION IMG BX SPEC US GUIDE 10/04/2022 [facility]   BREAST LUMPECTOMY,RADIO  FREQ LOCALIZER,AXILLARY SENTINEL LYMPH NODE BIOPSY Left 11/07/2022   Procedure: BREAST LUMPECTOMY,RADIO FREQ LOCALIZER,AXILLARY SENTINEL LYMPH NODE BIOPSY;  Surgeon: Henrene Dodge, MD;  Location: [facility] ORS;  Service: General;  Laterality: Left;   CHOLECYSTECTOMY N/A    COLONOSCOPY WITH ESOPHAGOGASTRODUODENOSCOPY (EGD)     INCISION AND DRAINAGE ABSCESS Left 04/11/2023   Procedure: INCISION AND DRAINAGE ABSCESS, breast;  Surgeon: Henrene Dodge, MD;  Location: [facility]S;  Service: General;  Laterality: Left;   JOINT REPLACEMENT     LAPAROSCOPIC GASTRIC BAND REMOVAL WITH LAPAROSCOPIC GASTRIC SLEEVE RESECTION     REPLACEMENT TOTAL KNEE BILATERAL     TUBAL LIGATION       Home Medications   Prior to Admission medications   Medication Sig Start Date End Date Taking? Authorizing Provider  famotidine (PEPCID) 20 MG tablet Take 1 tablet (20 mg total) by mouth 2 (two) times daily. 01/16/24  Yes Irean Hong, MD  predniSONE (DELTASONE) 20 MG tablet 3 tablets daily x 4 days 01/16/24  Yes Irean Hong, MD  acetaminophen (TYLENOL) 500 MG tablet Take 2 tablets (1,000 mg total) by mouth every 6 (six) hours as needed for mild pain. 11/07/22   Piscoya, Elita Quick, MD  ALOE VERA PO Take 1 tablet by mouth daily. Natural aloe    [provider]  anastrozole (ARIMIDEX) 1 MG tablet TAKE 1 TABLET(1 MG) BY MOUTH DAILY 12/24/23   Jeralyn Ruths, MD  ascorbic acid (VITAMIN C) 500 MG tablet Take 500 mg by mouth daily.    [provider]  B COMPLEX VITAMINS PO Take 1 tablet by mouth daily.    [provider]  BYETTA 5 MCG PEN 5 MCG/0.02ML SOPN injection Inject 5 mcg into the skin 2 (two) times daily with a meal. 12/06/23   [provider]  celecoxib (CELEBREX) 200 MG capsule Take 1 capsule (200 mg total) by mouth 2 (two) times daily as needed for moderate pain. 11/07/22   Henrene Dodge, MD  cetirizine (ZYRTEC) 10 MG tablet Take 10 mg by mouth daily.    [provider]  colchicine 0.6  MG tablet Take 1 tablet (0.6 mg total) by mouth daily. 01/11/23 01/11/24  Ward, Layla Maw, DO  diphenhydrAMINE (BENADRYL) 25 mg capsule Take 25 mg by mouth every 6 (six) hours as needed.    [provider]  fluticasone (FLONASE) 50 MCG/ACT nasal spray Place 1 spray into both nostrils daily.    [provider]  furosemide (LASIX) 40 MG tablet Take 40 mg by mouth daily as needed.    [provider]  gabapentin (NEURONTIN) 300 MG capsule Take 300 mg by mouth at bedtime. 06/28/16   [provider]  hydrOXYzine (ATARAX) 10 MG tablet Take 10 mg by mouth 2 (two) times daily. 09/26/22   [provider]  linaclotide (LINZESS) 290 MCG CAPS capsule Take 290 mcg by mouth daily before breakfast.    [provider]  metoprolol succinate (TOPROL-XL) 100 MG 24 hr tablet Take 1 tablet (100 mg total) by mouth daily. 12/27/23   Marykay Lex, MD  montelukast (SINGULAIR) 10 MG tablet Take 10 mg by mouth at bedtime.  [provider]  multivitamin-lutein (OCUVITE-LUTEIN) CAPS capsule Take 1 capsule by mouth as needed.    [provider]  omeprazole (PRILOSEC) 40 MG capsule PRN 09/13/17   [provider]  tiZANidine (ZANAFLEX) 4 MG tablet Take 4 mg by mouth every 8 (eight) hours as needed for muscle spasms.    [provider]  valsartan-hydrochlorothiazide (DIOVAN HCT) 80-12.5 MG tablet Take 1 tablet by mouth daily. 12/27/23   Marykay Lex, MD  metoprolol succinate (TOPROL-XL) 50 MG 24 hr tablet Take 50 mg by mouth daily. Take with or immediately following a meal. Patient not taking: No sig reported    [provider]     Allergies  Cephalexin, Etodolac, Iodinated contrast media, Latex, Penicillin g, Fish-derived products, Iodine, Penicillins, Shellfish allergy, and Lisinopril   Family History   Family History  Problem Relation Age of Onset   Hypertension Mother    Hypertension Father    Breast cancer Neg Hx       Physical Exam  Triage Vital Signs: ED Triage Vitals  Encounter Vitals Group     BP 01/15/24 2234 (!) 181/102     Systolic BP Percentile --      Diastolic BP Percentile --      Pulse Rate 01/15/24 2234 65     Resp 01/15/24 2234 18     Temp 01/15/24 2234 98.4 F (36.9 C)     Temp Source 01/15/24 2234 Oral     SpO2 01/15/24 2234 99 %     Weight 01/15/24 2233 280 lb (127 kg)     Height 01/15/24 2233 5\' 8"  (1.727 m)     Head Circumference --      Peak Flow --      Pain Score 01/15/24 2233 0     Pain Loc --      Pain Education --      Exclude from Growth Chart --     Updated Vital Signs: BP (!) 163/62 (BP Location: Right Arm)   Pulse 62   Temp 98.6 F (37 C) (Oral)   Resp 18   Ht 5\' 8"  (1.727 m)   Wt 127 kg   SpO2 99%   BMI 42.57 kg/m    General: Awake, no distress.  CV:  RRR.  Good peripheral perfusion.  Resp:  Normal effort.  CTAB. Abd:  No distention.  Other:  Scattered diffuse urticaria noted.  No angioedema.  Posterior oropharynx is clear.  Phonation intact.  There is no hoarse or muffled voice.  There is no drooling.  Tolerating secretions well.  No neck swelling.   ED Results / Procedures / Treatments  Labs (all labs ordered are listed, but only abnormal results are displayed) Labs Reviewed - No data to display   EKG  None   RADIOLOGY None   Official radiology report(s): No results found.   PROCEDURES:  Critical Care performed: No  Procedures   MEDICATIONS ORDERED IN ED: Medications  hydrOXYzine (ATARAX) tablet 50 mg (50 mg Oral Given 01/16/24 0232)  methylPREDNISolone sodium succinate (SOLU-MEDROL) 125 mg/2 mL injection 125 mg (125 mg Intramuscular Given 01/16/24 0233)  famotidine (PEPCID) tablet 20 mg (20 mg Oral Given 01/16/24 0232)     IMPRESSION / MDM / ASSESSMENT AND PLAN / ED COURSE  I reviewed the triage vital signs and the nursing notes.  58 year old female presenting with urticaria and itching  after having CT scan with IV contrast.  She has known allergy to IV contrast.  I personally reviewed patient's records but do not see a record of her having CT scan.  Looks like she had echo and renal artery duplex done recently and results were messaged to her on 01/12/2024.  Patient's presentation is most consistent with acute, uncomplicated illness.  Will treat with Atarax, Solu-Medrol, Pepcid.  Clinical Course as of 01/16/24 0552  Wed Jan 16, 2024  0551 Addendum on chart review: Patient was feeling better after medications.  She was discharged home on prednisone burst, Pepcid and encouraged to follow-up with her PCP.  Strict return precautions were given.  Patient verbalized understanding and agreed with plan of care. [JS]    Clinical Course User Index [JS] Irean Hong, MD   FINAL CLINICAL IMPRESSION(S) / ED DIAGNOSES   Final diagnoses:  Allergic reaction, initial encounter  Urticaria     Rx / DC Orders   ED Discharge Orders          Ordered    predniSONE (DELTASONE) 20 MG tablet        01/16/24 0313    famotidine (PEPCID) 20 MG tablet  2 times daily        01/16/24 5643             Note:  This document was prepared using Dragon voice recognition software and may include unintentional dictation errors.   Irean Hong, MD 01/16/24 [phone]

## 2024-01-16 NOTE — Discharge Instructions (Signed)
 1. Take the following medicines for the next 4 days: Prednisone 60mg  daily Pepcid 20mg  twice daily 2. Take Benadryl as needed for itching. 3.  Return to the ER for worsening symptoms, persistent vomiting, difficulty breathing or other concerns.

## 2024-01-19 NOTE — Progress Notes (Unsigned)
 Cardiology Clinic Note   Date: 01/22/2024 ID: Bergen, Melle 10-11-1966, MRN [id number]  Primary Cardiologist:  Bryan Lemma, MD  Chief Complaint   Heather Mcintyre is a 58 y.o. female who presents to the clinic today for follow up after medication changes and testing.   Patient Profile   Heather Mcintyre is followed by Dr. Herbie Baltimore for the history outlined below.      Past medical history significant for: Dyspnea/chest pain. Echo 01/11/2024: EF 55 to 60%.  No RWMA.  Normal diastolic parameters.  Normal RV size/function.  Mild MR. Bradycardia. Hypertension. Renal artery ultrasound 01/11/2024: Right: Normal size kidney, resistive index, cortical thickness.  1 to 59% stenosis of the renal artery (high end of the stenotic range).  Left: Normal cortical thickness.  No evidence of renal artery stenosis.  The left kidney was largely obscured. GERD. Left breast cancer. S/p lumpectomy with sentinel node biopsy 11/07/2022.  In summary, patient was first evaluated by Dr. Herbie Baltimore on 12/13/2022 for bradycardia and occasional chest pain at the request of Dr. Letta Pate.  Patient described brief pressure-like sensation in her chest that typically occur while at rest and sometimes with sleeping.  Discomfort is not exertional and not necessarily associated with dyspnea.  She expressed concern over low heart rates occurring during her recent procedures with rates as low as in the 40s.  She denied palpitations, lightheadedness, dizziness, near-syncope, syncope.  A ZIO monitor was ordered but patient chose not to wear it.  Upon follow-up in June 2024 patient continued to have bradycardia.  Her dose of metoprolol was decreased and Diovan was added.  Patient presented to the ED on 12/10/2023 with complaints of elevated BP.  Patient reported PCP switched her medications and she had been unable to control her BP since.  Labs at that time showed normal blood counts and electrolytes, creatinine 1.14, BUN 19.   Troponin 30>> 26.  Patient left without being seen.  Patient was last seen in the office by Dr. Herbie Baltimore on 12/20/2023 for evaluation of elevated BP.  Patient reported blood pressure readings as high as 235/100 over the past month.  She self increased metoprolol dose to 150 mg daily.  She also reported previously tried valsartan in the past but discontinued it due to feeling unwell.  She reported lower extremity edema L>R, DOE, exercise intolerance, and chronic chest pain.  Chest pain is localized to the surgical site of her lumpectomy.  BP at the time of her visit was 152/80 and 146/78 on recheck.  Echo and renal artery ultrasound were ordered (detailed above).  Toprol was decreased to 100 mg daily and Diovan was added.  Patient presented to ED on 12/27/2023 for elevated BP to 200/89 and chest pressure. D-dimer was elevated. VQ scan was negative for PE. Troponin 36>>35>>33.   Patient presented to ED on 01/15/2024 with complaint of itching. She denied difficulty breathing or swallowing. She was treated with atarax, solu-medrol and Pepcid. She took benadryl prior to arrival.      History of Present Illness    Today, patient reports she has been holding Diovan since yesterday secondary to developing widespread itching without rash.  She reports she has taken valsartan in the past and feeling general malaise with it. She is not sure if the valsartan is causing her itching or if it is recent start of Byetta. She also states she has been eating a lot of blueberries and wonders if it could be that. She has never undergone allergy  testing. Her BP is elevated today at 165/100 on intake and 160/90 on my recheck. Home BP has been as high as 180/100 recently. She reports feeling poorly when BP is elevated. She has chronic vision fluctuations and is unsure if this is related to elevated BP. She does follow a low sodium diet. She feels very frustrated with the state of her health. She would like to get better control of  her BP. Discussed results of renal US and correspondence between Dr. Herbie Baltimore and Dr. Kirke Corin. Dr. Kirke Corin felt the [place] was poor quality and suggested CT to further evaluate renal arteries. Dr. Herbie Baltimore stated he does not have a high suspicion for renal artery stenosis. All questions were answered.      ROS: All other systems reviewed and are otherwise negative except as noted in History of Present Illness.  EKGs/Labs Reviewed        12/27/2023: BUN 18; Creatinine, Ser 0.93; Potassium 3.9; Sodium 140   12/27/2023: Hemoglobin 13.4; WBC 6.6    Risk Assessment/Calculations      HYPERTENSION CONTROL Vitals:   01/22/24 0830 01/22/24 1204  BP: (!) 165/100 (!) 160/90    The patient's blood pressure is elevated above target today.  In order to address the patient's elevated BP: A new medication was prescribed today.      STOP-Bang Score:  6       Physical Exam    VS:  BP (!) 160/90 (BP Location: Right Arm, Patient Position: Sitting, Cuff Size: Large)   Pulse (!) 58   Ht 5\' 8"  (1.727 m)   Wt 278 lb 9.6 oz (126.4 kg)   SpO2 98%   BMI 42.36 kg/m  , BMI Body mass index is 42.36 kg/m.  GEN: Well nourished, well developed, in no acute distress. Neck: No JVD or carotid bruits. Cardiac:  RRR. No murmurs. No rubs or gallops.   Respiratory:  Respirations regular and unlabored. Clear to auscultation without rales, wheezing or rhonchi. GI: Soft, nontender, nondistended. Extremities: Radials/DP/PT 2+ and equal bilaterally. No clubbing or cyanosis. No edema.  Skin: Warm and dry, no rash. Neuro: Strength intact.  Assessment & Plan   Dyspnea Echo February 2025 showed normal LV/RV function, no RWMA, normal diastolic parameters, mild MR.  Patient denies dyspnea today. Breath sounds clear to auscultation.  -No further testing indicated at this time.   Bradycardia Patient has noted HR in the 40s at times.  At last visit with Dr. Herbie Baltimore in January patient had been taking metoprolol 150 mg. She  was reduced to 100 mg daily. HR today 58 bpm.  -Changing Toprol to carvedilol (see below).   Hypertension Renal artery ultrasound February 2025 demonstrated 1 to 59% stenosis of right renal artery and no stenosis of left renal artery however left kidney was poorly visualized. Patient has been holding Diovan since yesterday secondary to developing itching after she started the medication. She feels itching is slightly improved since stopping it but she is also taking zyrtec and benadryl.  BP today 165/100 on intake and 160/90 on my recheck. Discussed the possibility of getting CT for further evaluation of renal arteries given poor quality of [place]. She would like to hold off for now.  -Stop Toprol and Diovan. -Start carvedilol 12.5 mg bid and hydrochlorothiazide 12.5 mg daily.  -Keep BP log.   Itching without rash Patient has been holding Diovan since yesterday. She feels itching has improved but she is also taking zyrtec and benadryl. She is not sure if Diovan is  the culprit of her itching, as she has also started byetta and has been eating a lot of blueberries. She has not stopped the byetta.  -Refer to allergist for further evaluation.   Sleep disordered breathing Patient reports she has been told she snores and stops breathing in her sleep. She constantly feels somnolent. Stop bang score 6.  -Refer to pulmonology.   Disposition: Stop Toprol and Diovan. Start carvedilol 12.5 mg bid and hydrochlorothiazide 12.5 mg daily. Refer to allergist and pulmonology. Return in 3 weeks or sooner as needed.          Signed, Heather Mcintyre. Heather Avery, DNP, NP-C

## 2024-01-22 ENCOUNTER — Ambulatory Visit: Payer: Medicare HMO | Attending: Student | Admitting: Student

## 2024-01-22 ENCOUNTER — Encounter: Payer: Self-pay | Admitting: Student

## 2024-01-22 VITALS — BP 160/90 | HR 58 | Ht 68.0 in | Wt 278.6 lb

## 2024-01-22 DIAGNOSIS — L299 Pruritus, unspecified: Secondary | ICD-10-CM | POA: Diagnosis not present

## 2024-01-22 DIAGNOSIS — G4730 Sleep apnea, unspecified: Secondary | ICD-10-CM

## 2024-01-22 DIAGNOSIS — I10 Essential (primary) hypertension: Secondary | ICD-10-CM

## 2024-01-22 DIAGNOSIS — R001 Bradycardia, unspecified: Secondary | ICD-10-CM

## 2024-01-22 DIAGNOSIS — R0609 Other forms of dyspnea: Secondary | ICD-10-CM | POA: Diagnosis not present

## 2024-01-22 MED ORDER — CARVEDILOL 12.5 MG PO TABS: 12.5000 mg | ORAL_TABLET | Freq: Two times a day (BID) | ORAL | 3 refills | Status: DC

## 2024-01-22 MED ORDER — HYDROCHLOROTHIAZIDE 12.5 MG PO CAPS: 12.5000 mg | ORAL_CAPSULE | Freq: Every day | ORAL | 3 refills | Status: DC

## 2024-01-22 NOTE — Patient Instructions (Signed)
 Medication Instructions:  Your physician has recommended you make the following change in your medication:   STOP Metoprolol STOP Diovan START Carvedilol 12.5 mg twice daily  START hydrochlorothiazide 12.5 mg once daily   *If you need a refill on your cardiac medications before your next appointment, please call your pharmacy*   Lab Work: None  If you have labs (blood work) drawn today and your tests are completely normal, you will receive your results only by: MyChart Message (if you have MyChart) OR A paper copy in the mail If you have any lab test that is abnormal or we need to change your treatment, we will call you to review the results.   Testing/Procedures: None   Follow-Up: At [facility], you and your health needs are our priority.  As part of our continuing mission to provide you with exceptional heart care, we have created designated Provider Care Teams.  These Care Teams include your primary Cardiologist (physician) and Advanced Practice Providers (APPs -  Physician Assistants and Nurse Practitioners) who all work together to provide you with the care you need, when you need it.  We recommend signing up for the patient portal called "MyChart".  Sign up information is provided on this After Visit Summary.  MyChart is used to connect with patients for Virtual Visits (Telemedicine).  Patients are able to view lab/test results, encounter notes, upcoming appointments, etc.  Non-urgent messages can be sent to your provider as well.   To learn more about what you can do with MyChart, go to [email or web address].    Your next appointment:   3 week(s)  Provider:   Carlos Levering, NP    Other Instructions BP log for monitoring. Tips  Please monitor blood pressures and keep a log of your readings.   Make sure to check 2 hours after your medications.   AVOID these things for 30 minutes before checking your blood pressure: No Drinking caffeine. No Drinking  alcohol. No Eating. No Smoking. No Exercising.  Five minutes before checking your blood pressure: Pee. Sit in a dining chair. Avoid sitting in a soft couch or armchair. Be quiet. Do not talk.  Referral to Pulmonary their number is [phone]  Referral to Allergist their number is [phone]

## 2024-01-23 ENCOUNTER — Telehealth: Payer: Self-pay | Admitting: Cardiology

## 2024-01-23 MED ORDER — HYDROCHLOROTHIAZIDE 25 MG PO TABS: 12.5000 mg | ORAL_TABLET | Freq: Every day | ORAL | 3 refills | Status: DC

## 2024-01-23 NOTE — Telephone Encounter (Signed)
 Patient identification verified by 2 forms. Marilynn Rail, RN    Called and spoke to patient  Patient states:   -picked up hydrochlorothiazide capsule yesterday   -shortly after taking developed itching   -she has sensitivity to dyes   -she will need tablet form of Rx to relieve itching   -requests tablet sent to pharmacy  Informed patient:   -Rx for tablet sent for tablet   -she will need to take 0.5 tablet daily  Patient verbalized understanding, no questions at this time

## 2024-01-23 NOTE — Telephone Encounter (Signed)
 Pt c/o medication issue:  1. Name of Medication: hydrochlorothiazide (MICROZIDE) 12.5 MG capsule   2. How are you currently taking this medication (dosage and times per day)? a  3. Are you having a reaction (difficulty breathing--STAT)? no  4. What is your medication issue? Patient states that medication causes her to itch really bad. She states that she needs the medication in white. Please advise

## 2024-01-24 ENCOUNTER — Encounter: Payer: Self-pay | Admitting: Sleep Medicine

## 2024-01-24 ENCOUNTER — Ambulatory Visit (INDEPENDENT_AMBULATORY_CARE_PROVIDER_SITE_OTHER): Payer: Medicare HMO | Admitting: Sleep Medicine

## 2024-01-24 VITALS — BP 130/72 | HR 65 | Temp 97.6°F | Ht 68.0 in | Wt 277.8 lb

## 2024-01-24 DIAGNOSIS — R351 Nocturia: Secondary | ICD-10-CM | POA: Diagnosis not present

## 2024-01-24 DIAGNOSIS — R0683 Snoring: Secondary | ICD-10-CM | POA: Diagnosis not present

## 2024-01-24 DIAGNOSIS — G4700 Insomnia, unspecified: Secondary | ICD-10-CM

## 2024-01-24 DIAGNOSIS — Z6841 Body Mass Index (BMI) 40.0 and over, adult: Secondary | ICD-10-CM

## 2024-01-24 DIAGNOSIS — R400 Somnolence: Secondary | ICD-10-CM

## 2024-01-24 DIAGNOSIS — I10 Essential (primary) hypertension: Secondary | ICD-10-CM | POA: Diagnosis not present

## 2024-01-24 DIAGNOSIS — F5104 Psychophysiologic insomnia: Secondary | ICD-10-CM

## 2024-01-24 DIAGNOSIS — G4733 Obstructive sleep apnea (adult) (pediatric): Secondary | ICD-10-CM

## 2024-01-24 NOTE — Progress Notes (Signed)
 Name:Heather Mcintyre MRN: [id number] DOB: 02/08/1966   CHIEF COMPLAINT:  EXCESSIVE DAYTIME SLEEPINESS   HISTORY OF PRESENT ILLNESS:  Heather Mcintyre is a 58 y.o. w/ a h/o HTN, OA, breast CA and morbid obesity who present for c/o snoring and excessive daytime sleepiness which has been present for several years. Reports nocturnal awakenings due to nocturia and has difficulty falling back to sleep. Reports a 15 lb weight gain over the last year. Admits to dry mouth and night sweats. Denies morning headaches, dream enactment, cataplexy, hypnagogic or hypnapompic hallucinations. Denies a family history of sleep apnea. Admits to drowsy driving. Drinks 1 cup of decaf coffee daily, soda occasionally, denies alcohol, tobacco or illicit drug use.   Bedtime 8 pm Sleep onset 10 mins Rise time 9 am   EPWORTH SLEEP SCORE 2    01/24/2024    9:32 AM  Results of the Epworth flowsheet  Sitting and reading 1  Watching TV 1  Sitting, inactive in a public place (e.g. a theatre or a meeting) 0  As a passenger in a car for an hour without a break 0  Lying down to rest in the afternoon when circumstances permit 0  Sitting and talking to someone 0  Sitting quietly after a lunch without alcohol 0  In a car, while stopped for a few minutes in traffic 0  Total score 2     PAST MEDICAL HISTORY :   has a past medical history of Anxiety, Asthma, Breast cancer (HCC), Cervical cancer (HCC), Complication of anesthesia, Degenerative joint disease/osteoarthritis, Depression, GERD (gastroesophageal reflux disease), Hypertension, Lupus, and Pre-diabetes.  has a past surgical history that includes Abdominal hysterectomy; Replacement total knee bilateral; Cholecystectomy (N/A); Laparoscopic gastric band removal with laparoscopic gastric sleeve resection; Tubal ligation; Breast biopsy (Left, 10/04/2022); Joint replacement; Colonoscopy with esophagogastroduodenoscopy (egd); Breast lumpectomy,radio freq  localizer,axillary sentinel lymph node biopsy (Left, 11/07/2022); and Incision and drainage abscess (Left, 04/11/2023). Prior to Admission medications   Medication Sig Start Date End Date Taking? Authorizing Provider  acetaminophen (TYLENOL) 500 MG tablet Take 2 tablets (1,000 mg total) by mouth every 6 (six) hours as needed for mild pain. 11/07/22  Yes Piscoya, Jose, MD  ALOE VERA PO Take 1 tablet by mouth daily. Natural aloe   Yes [provider]  anastrozole (ARIMIDEX) 1 MG tablet TAKE 1 TABLET(1 MG) BY MOUTH DAILY 12/24/23  Yes Jeralyn Ruths, MD  ascorbic acid (VITAMIN C) 500 MG tablet Take 500 mg by mouth daily.   Yes [provider]  B COMPLEX VITAMINS PO Take 1 tablet by mouth daily.   Yes [provider]  BYETTA 5 MCG PEN 5 MCG/0.02ML SOPN injection Inject 5 mcg into the skin 2 (two) times daily with a meal. 12/06/23  Yes [provider]  carvedilol (COREG) 12.5 MG tablet Take 1 tablet (12.5 mg total) by mouth 2 (two) times daily. 01/22/24 04/21/24 Yes Wittenborn, Gavin Pound, NP  celecoxib (CELEBREX) 200 MG capsule Take 1 capsule (200 mg total) by mouth 2 (two) times daily as needed for moderate pain. 11/07/22  Yes Piscoya, Elita Quick, MD  cetirizine (ZYRTEC) 10 MG tablet Take 10 mg by mouth daily.   Yes [provider]  diphenhydrAMINE (BENADRYL) 25 mg capsule Take 25 mg by mouth every 6 (six) hours as needed.   Yes [provider]  famotidine (PEPCID) 20 MG tablet Take 1 tablet (20 mg total) by mouth 2 (two) times daily. 01/16/24  Yes Chiquita Loth  J, MD  fluticasone (FLONASE) 50 MCG/ACT nasal spray Place 1 spray into both nostrils daily.   Yes [provider]  furosemide (LASIX) 40 MG tablet Take 40 mg by mouth daily as needed.   Yes [provider]  gabapentin (NEURONTIN) 300 MG capsule Take 300 mg by mouth at bedtime. 06/28/16  Yes [provider]  hydrochlorothiazide (HYDRODIURIL) 25 MG tablet Take 0.5 tablets (12.5 mg  total) by mouth daily. 01/23/24 04/22/24 Yes Wittenborn, Gavin Pound, NP  hydrOXYzine (ATARAX) 10 MG tablet Take 10 mg by mouth 2 (two) times daily. 09/26/22  Yes [provider]  linaclotide (LINZESS) 290 MCG CAPS capsule Take 290 mcg by mouth daily before breakfast.   Yes [provider]  montelukast (SINGULAIR) 10 MG tablet Take 10 mg by mouth at bedtime.   Yes [provider]  multivitamin-lutein (OCUVITE-LUTEIN) CAPS capsule Take 1 capsule by mouth as needed.   Yes [provider]  omeprazole (PRILOSEC) 40 MG capsule PRN 09/13/17  Yes [provider]  predniSONE (DELTASONE) 20 MG tablet 3 tablets daily x 4 days 01/16/24  Yes Irean Hong, MD  tiZANidine (ZANAFLEX) 4 MG tablet Take 4 mg by mouth every 8 (eight) hours as needed for muscle spasms.   Yes [provider]  colchicine 0.6 MG tablet Take 1 tablet (0.6 mg total) by mouth daily. 01/11/23 01/11/24  Ward, Layla Maw, DO  metoprolol succinate (TOPROL-XL) 50 MG 24 hr tablet Take 50 mg by mouth daily. Take with or immediately following a meal. Patient not taking: No sig reported    [provider]   Allergies  Allergen Reactions   Cephalexin Hives and Other (See Comments)   Etodolac Hives   Iodinated Contrast Media Hives   Latex Hives and Other (See Comments)   Penicillin G Hives   Fish-Derived Products Other (See Comments)   Iodine Hives   Penicillins    Shellfish Allergy Hives   Lisinopril Other (See Comments)    FAMILY HISTORY:  family history includes Hypertension in her father and mother. SOCIAL HISTORY:  reports that she has never smoked. She has never been exposed to tobacco smoke. She has never used smokeless tobacco. She reports that she does not drink alcohol and does not use drugs.   Review of Systems:  Gen:  Denies  fever, sweats, chills weight loss  HEENT: Denies blurred vision, double vision, ear pain, eye pain, hearing loss, nose bleeds, sore  throat Cardiac:  No dizziness, chest pain or heaviness, chest tightness,edema, No JVD Resp:   No cough, -sputum production, -shortness of breath,-wheezing, -hemoptysis,  Gi: Denies swallowing difficulty, stomach pain, nausea or vomiting, diarrhea, constipation, bowel incontinence Gu:  Denies bladder incontinence, burning urine Ext:   Denies Joint pain, stiffness or swelling Skin: Denies  skin rash, easy bruising or bleeding or hives Endoc:  Denies polyuria, polydipsia , polyphagia or weight change Psych:   Denies depression, insomnia or hallucinations  Other:  All other systems negative  VITAL SIGNS: BP 130/72 (BP Location: Right Arm, Patient Position: Sitting, Cuff Size: Normal)   Pulse 65   Temp 97.6 F (36.4 C) (Temporal)   Ht 5\' 8"  (1.727 m)   Wt 277 lb 12.8 oz (126 kg)   SpO2 98%   BMI 42.24 kg/m    Physical Examination:   General Appearance: No distress  EYES PERRLA, EOM intact.   NECK Supple, No JVD Pulmonary: normal breath sounds, No wheezing.  CardiovascularNormal S1,S2.  No m/r/g.   Abdomen:  Benign, Soft, non-tender. Skin:   warm, no rashes, no ecchymosis  Extremities: normal, no cyanosis, clubbing. Neuro:without focal findings,  speech normal  PSYCHIATRIC: Mood, affect within normal limits.   ASSESSMENT AND PLAN  OSA I suspect that OSA is likely present due to clinical presentation. Discussed the consequences of untreated sleep apnea. Advised not to drive drowsy for safety of patient and others. Will complete further evaluation with a home sleep study and follow up to review results.    HTN Stable, on current management. Following with PCP.   Morbid obesity Counseled patient on diet and lifestyle modification.  Insomnia Counseled patient on stimulus control and improve sleep hygiene practices.    MEDICATION ADJUSTMENTS/LABS AND TESTS ORDERED: Recommend Sleep Study   Patient  satisfied with Plan of action and management. All questions  answered  Follow up to review HST results and treatment plan.   I spent a total of 45 minutes reviewing chart data, face-to-face evaluation with the patient, counseling and coordination of care as detailed above.    Tempie Hoist, M.D.  Sleep Medicine [facility]

## 2024-01-24 NOTE — Patient Instructions (Signed)
 Marland Kitchen

## 2024-01-28 ENCOUNTER — Telehealth: Payer: Self-pay

## 2024-01-28 NOTE — Telephone Encounter (Signed)
 noted

## 2024-01-28 NOTE — Telephone Encounter (Signed)
 She was having trouble with itching internally so her doctor put her on 20 days of prednisone. She is on the tenth day. Since she has been taking it, she has not had any back pain. She wants to hold off on the procedure for now and will call when the pain returns.

## 2024-02-04 ENCOUNTER — Inpatient Hospital Stay: Payer: Medicare HMO | Attending: Oncology | Admitting: Oncology

## 2024-02-04 DIAGNOSIS — C50912 Malignant neoplasm of unspecified site of left female breast: Secondary | ICD-10-CM

## 2024-02-04 NOTE — Progress Notes (Unsigned)
 [facility]  [phone] [phone]  ID: Heather Mcintyre OB: Nov 07, 1966  MR#: [id number]  JJO#:[id number]  Patient Care Team: Emogene Morgan, MD as PCP - General (Family Medicine) Marykay Lex, MD as PCP - Cardiology (Cardiology) Hulen Luster, RN as Oncology Nurse Navigator Orlie Dakin, Tollie Pizza, MD as Consulting Physician (Oncology) Carmina Miller, MD as Consulting Physician (Radiation Oncology) Henrene Dodge, MD as Consulting Physician (General Surgery) Alanda Slim, MD as Consulting Physician (Sleep Medicine)  I connected with Heather Mcintyre on 02/05/24 at  2:30 PM EDT by video enabled telemedicine visit and verified that I am speaking with the correct person using two identifiers.   I discussed the limitations, risks, security and privacy concerns of performing an evaluation and management service by telemedicine and the availability of in-person appointments. I also discussed with the patient that there may be a patient responsible charge related to this service. The patient expressed understanding and agreed to proceed.   Other persons participating in the visit and their role in the encounter: Patient, MD.  Patient's location: Work. Provider's location: Clinic.  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  Oncotype Dx score 0.  INTERVAL HISTORY: Patient agreed to video-assisted telemedicine visit for her routine 67-month evaluation.  She continues to have hot flashes with anastrozole, but they do not affect her day-to-day activity.  She otherwise feels well and is asymptomatic. She has no neurologic complaints.  She denies any recent fevers or illnesses.  She has a good appetite and denies weight loss.  She has no chest pain, shortness of breath, cough, or hemoptysis.  She denies any nausea, vomiting, constipation, or diarrhea.  She has no urinary complaints.  Patient offers  no further specific complaints today.  REVIEW OF SYSTEMS:   Review of Systems  Constitutional: Negative.  Negative for fever, malaise/fatigue and weight loss.  Respiratory: Negative.  Negative for cough, hemoptysis and shortness of breath.   Cardiovascular: Negative.  Negative for chest pain and leg swelling.  Gastrointestinal: Negative.  Negative for abdominal pain.  Genitourinary: Negative.  Negative for dysuria.  Musculoskeletal: Negative.  Negative for back pain.  Skin: Negative.  Negative for rash.  Neurological:  Positive for sensory change. Negative for dizziness, focal weakness, weakness and headaches.  Psychiatric/Behavioral: Negative.  The patient is not nervous/anxious.     As per HPI. Otherwise, a complete review of systems is negative.  PAST MEDICAL HISTORY: Past Medical History:  Diagnosis Date   Anxiety    Asthma    Breast cancer (HCC)    Cervical cancer (HCC)    Complication of anesthesia    woke up in the middle of surgery 2010   Degenerative joint disease/osteoarthritis    Depression    GERD (gastroesophageal reflux disease)    Hypertension    Lupus    Pre-diabetes     PAST SURGICAL HISTORY: Past Surgical History:  Procedure Laterality Date   ABDOMINAL HYSTERECTOMY     BREAST BIOPSY Left 10/04/2022   [place] LT BREAST BX W LOC DEV 1ST LESION IMG BX SPEC US GUIDE 10/04/2022 [facility]   BREAST LUMPECTOMY,RADIO FREQ LOCALIZER,AXILLARY SENTINEL LYMPH NODE BIOPSY Left 11/07/2022   Procedure: BREAST LUMPECTOMY,RADIO FREQ LOCALIZER,AXILLARY SENTINEL LYMPH NODE BIOPSY;  Surgeon: Henrene Dodge, MD;  Location: [facility];  Service: General;  Laterality: Left;   CHOLECYSTECTOMY N/A    COLONOSCOPY WITH ESOPHAGOGASTRODUODENOSCOPY (EGD)     INCISION AND DRAINAGE ABSCESS Left  04/11/2023   Procedure: INCISION AND DRAINAGE ABSCESS, breast;  Surgeon: Henrene Dodge, MD;  Location: [facility];  Service: General;  Laterality: Left;   JOINT REPLACEMENT     LAPAROSCOPIC GASTRIC  BAND REMOVAL WITH LAPAROSCOPIC GASTRIC SLEEVE RESECTION     REPLACEMENT TOTAL KNEE BILATERAL     TUBAL LIGATION      FAMILY HISTORY: Family History  Problem Relation Age of Onset   Hypertension Mother    Hypertension Father    Breast cancer Neg Hx     ADVANCED DIRECTIVES (Y/N):  N  HEALTH MAINTENANCE: Social History   Tobacco Use   Smoking status: Never    Passive exposure: Never   Smokeless tobacco: Never  Vaping Use   Vaping status: Never Used  Substance Use Topics   Alcohol use: No   Drug use: Never     Colonoscopy:  PAP:  Bone density:  Lipid panel:  Allergies  Allergen Reactions   Cephalexin Hives and Other (See Comments)   Etodolac Hives   Iodinated Contrast Media Hives   Latex Hives and Other (See Comments)   Penicillin G Hives   Fish-Derived Products Other (See Comments)   Iodine Hives   Penicillins    Shellfish Allergy Hives   Lisinopril Other (See Comments)    Current Outpatient Medications  Medication Sig Dispense Refill   acetaminophen (TYLENOL) 500 MG tablet Take 2 tablets (1,000 mg total) by mouth every 6 (six) hours as needed for mild pain.     ALOE VERA PO Take 1 tablet by mouth daily. Natural aloe     anastrozole (ARIMIDEX) 1 MG tablet TAKE 1 TABLET(1 MG) BY MOUTH DAILY 30 tablet 3   ascorbic acid (VITAMIN C) 500 MG tablet Take 500 mg by mouth daily.     azelastine (ASTELIN) 0.1 % nasal spray Place 2 sprays into both nostrils 2 (two) times daily.     B COMPLEX VITAMINS PO Take 1 tablet by mouth daily.     BYETTA 5 MCG PEN 5 MCG/0.02ML SOPN injection Inject 5 mcg into the skin 2 (two) times daily with a meal.     carvedilol (COREG) 12.5 MG tablet Take 1 tablet (12.5 mg total) by mouth 2 (two) times daily. 180 tablet 3   celecoxib (CELEBREX) 200 MG capsule Take 1 capsule (200 mg total) by mouth 2 (two) times daily as needed for moderate pain. 40 capsule 1   cetirizine (ZYRTEC) 10 MG tablet Take 10 mg by mouth daily.     colchicine 0.6 MG  tablet Take 1 tablet (0.6 mg total) by mouth daily. 30 tablet 0   diphenhydrAMINE (BENADRYL) 25 mg capsule Take 25 mg by mouth every 6 (six) hours as needed.     EPINEPHrine 0.3 mg/0.3 mL IJ SOAJ injection Inject 0.3 mg into the muscle.     famotidine (PEPCID) 20 MG tablet Take 1 tablet (20 mg total) by mouth 2 (two) times daily. 8 tablet 0   fluticasone (FLONASE) 50 MCG/ACT nasal spray Place 1 spray into both nostrils daily.     furosemide (LASIX) 40 MG tablet Take 40 mg by mouth daily as needed.     gabapentin (NEURONTIN) 300 MG capsule Take 300 mg by mouth at bedtime.     hydrochlorothiazide (HYDRODIURIL) 25 MG tablet Take 0.5 tablets (12.5 mg total) by mouth daily. 45 tablet 3   hydrOXYzine (ATARAX) 10 MG tablet Take 10 mg by mouth 2 (two) times daily.     linaclotide (LINZESS) 290 MCG CAPS  capsule Take 290 mcg by mouth daily before breakfast.     montelukast (SINGULAIR) 10 MG tablet Take 10 mg by mouth at bedtime.     multivitamin-lutein (OCUVITE-LUTEIN) CAPS capsule Take 1 capsule by mouth as needed.     omeprazole (PRILOSEC) 40 MG capsule PRN     predniSONE (DELTASONE) 20 MG tablet 3 tablets daily x 4 days 12 tablet 0   tiZANidine (ZANAFLEX) 4 MG tablet Take 4 mg by mouth every 8 (eight) hours as needed for muscle spasms.     VENTOLIN HFA 108 (90 Base) MCG/ACT inhaler Inhale 2 puffs into the lungs every 6 (six) hours as needed.     No current facility-administered medications for this visit.    OBJECTIVE: There were no vitals filed for this visit.    There is no height or weight on file to calculate BMI.    ECOG FS:0 - Asymptomatic  General: Well-developed, well-nourished, no acute distress. HEENT: Normocephalic. Neuro: Alert, answering all questions appropriately. Cranial nerves grossly intact. Psych: Normal affect.  LAB RESULTS:  Lab Results  Component Value Date   NA 140 12/27/2023   K 3.9 12/27/2023   CL 104 12/27/2023   CO2 26 12/27/2023   GLUCOSE 87 12/27/2023   BUN  18 12/27/2023   CREATININE 0.93 12/27/2023   CALCIUM 9.5 12/27/2023   GFRNONAA >60 12/27/2023   GFRAA >60 02/10/2017    Lab Results  Component Value Date   WBC 6.6 12/27/2023   NEUTROABS 4.9 04/11/2023   HGB 13.4 12/27/2023   HCT 39.8 12/27/2023   MCV 85.4 12/27/2023   PLT 294 12/27/2023     STUDIES: ECHOCARDIOGRAM COMPLETE Result Date: 01/11/2024    ECHOCARDIOGRAM REPORT   Patient Name:   KIYA ENO Date of Exam: 01/11/2024 Medical Rec #:  [id number]       Height:       68.0 in Accession #:    [id number]      Weight:       278.0 lb Date of Birth:  [birth year]      BSA:          2.351 m Patient Age:    57 years        BP:           158/80 mmHg Patient Gender: F               HR:           58 bpm. Exam Location:  [place] Procedure: 2D Echo, 3D Echo, Cardiac Doppler and Color Doppler (Both Spectral            and Color Flow Doppler were utilized during procedure). Indications:    R00.1 Bradycardia, unspecified; R07.9* Chest pain, unspecified  History:        Patient has no prior history of Echocardiogram examinations.                 Arrythmias:Bradycardia, Signs/Symptoms:Chest Pain and Shortness                 of Breath; Risk Factors:Non-Smoker.  Sonographer:    Quentin Ore RDMS, RVT, RDCS Referring Phys: [id number] DAVID W HARDING IMPRESSIONS  1. Left ventricular ejection fraction, by estimation, is 55 to 60%. The left ventricle has normal function. The left ventricle has no regional wall motion abnormalities. Left ventricular diastolic parameters were normal.  2. Right ventricular systolic function is normal. The right ventricular size is normal.  3. The mitral valve is normal  in structure. Mild mitral valve regurgitation.  4. The aortic valve is tricuspid. Aortic valve regurgitation is not visualized.  5. The inferior vena cava is normal in size with greater than 50% respiratory variability, suggesting right atrial pressure of 3 mmHg. FINDINGS  Left Ventricle: Left ventricular ejection  fraction, by estimation, is 55 to 60%. The left ventricle has normal function. The left ventricle has no regional wall motion abnormalities. Strain imaging was not performed. The left ventricular internal cavity  size was normal in size. There is no left ventricular hypertrophy. Left ventricular diastolic parameters were normal. Right Ventricle: The right ventricular size is normal. No increase in right ventricular wall thickness. Right ventricular systolic function is normal. Left Atrium: Left atrial size was normal in size. Right Atrium: Right atrial size was normal in size. Pericardium: There is no evidence of pericardial effusion. Mitral Valve: The mitral valve is normal in structure. Mild mitral valve regurgitation. Tricuspid Valve: The tricuspid valve is normal in structure. Tricuspid valve regurgitation is mild. Aortic Valve: The aortic valve is tricuspid. Aortic valve regurgitation is not visualized. Aortic valve mean gradient measures 2.0 mmHg. Aortic valve peak gradient measures 4.5 mmHg. Aortic valve area, by VTI measures 3.28 cm. Pulmonic Valve: The pulmonic valve was not well visualized. Pulmonic valve regurgitation is not visualized. Aorta: The aortic root and ascending aorta are structurally normal, with no evidence of dilitation. Venous: The inferior vena cava is normal in size with greater than 50% respiratory variability, suggesting right atrial pressure of 3 mmHg. IAS/Shunts: No atrial level shunt detected by color flow Doppler. Additional Comments: 3D was performed not requiring image post processing on an independent workstation and was indeterminate.  LEFT VENTRICLE PLAX 2D LVIDd:         5.10 cm      Diastology LVIDs:         3.30 cm      LV e' medial:    7.07 cm/s LV PW:         0.90 cm      LV E/e' medial:  13.0 LV IVS:        1.00 cm      LV e' lateral:   10.70 cm/s LVOT diam:     2.10 cm      LV E/e' lateral: 8.6 LV SV:         88 LV SV Index:   38 LVOT Area:     3.46 cm                               3D Volume EF: LV Volumes (MOD)            3D EF:        59 % LV vol d, MOD A2C: 144.0 ml LV EDV:       237 ml LV vol d, MOD A4C: 119.0 ml LV ESV:       97 ml LV vol s, MOD A2C: 68.8 ml  LV SV:        140 ml LV vol s, MOD A4C: 55.3 ml LV SV MOD A2C:     75.2 ml LV SV MOD A4C:     119.0 ml LV SV MOD BP:      67.4 ml RIGHT VENTRICLE             IVC RV Basal diam:  3.90 cm     IVC diam: 1.40 cm RV  S prime:     12.00 cm/s TAPSE (M-mode): 2.8 cm LEFT ATRIUM             Index        RIGHT ATRIUM           Index LA diam:        3.80 cm 1.62 cm/m   RA Area:     18.40 cm LA Vol (A2C):   82.2 ml 34.97 ml/m  RA Volume:   52.30 ml  22.25 ml/m LA Vol (A4C):   55.8 ml 23.74 ml/m LA Biplane Vol: 67.7 ml 28.80 ml/m  AORTIC VALVE                    PULMONIC VALVE AV Area (Vmax):    3.66 cm     PV Vmax:       0.84 m/s AV Area (Vmean):   3.41 cm     PV Peak grad:  2.8 mmHg AV Area (VTI):     3.28 cm AV Vmax:           106.00 cm/s AV Vmean:          71.900 cm/s AV VTI:            0.269 m AV Peak Grad:      4.5 mmHg AV Mean Grad:      2.0 mmHg LVOT Vmax:         112.00 cm/s LVOT Vmean:        70.700 cm/s LVOT VTI:          0.255 m LVOT/AV VTI ratio: 0.95  AORTA Ao Root diam: 3.10 cm Ao Asc diam:  3.00 cm Ao Arch diam: 2.9 cm MITRAL VALVE               TRICUSPID VALVE MV Area (PHT): 3.65 cm    TR Peak grad:   22.7 mmHg MV Decel Time: 208 msec    TR Vmax:        238.00 cm/s MV E velocity: 91.70 cm/s MV A velocity: 66.30 cm/s  SHUNTS MV E/A ratio:  1.38        Systemic VTI:  0.26 m                            Systemic Diam: 2.10 cm Debbe Odea MD Electronically signed by Debbe Odea MD Signature Date/Time: 01/11/2024/5:57:21 PM    Final    VAS US RENAL ARTERY DUPLEX Result Date: 01/11/2024 ABDOMINAL VISCERAL Patient Name:  ZYARA RILING  Date of Exam:   01/11/2024 Medical Rec #: [id number]        Accession #:    [id number] Date of Birth: 1966-09-23       Patient Gender: F Patient Age:   57 years Exam  Location:  [place] Procedure:      VAS US RENAL ARTERY DUPLEX Referring Phys: [id number] DAVID W HARDING -------------------------------------------------------------------------------- Indications: Hyperftension High Risk Factors: No history of smoking. Other Factors: Last blood pressure                Normal labs 12/27/23. Limitations: Air/bowel gas and obesity. Comparison Study: No previous Performing Technologist: Quentin Ore RDMS, RVT, RDCS  Examination Guidelines: A complete evaluation includes B-mode imaging, spectral Doppler, color Doppler, and power Doppler as needed of all accessible portions of each vessel. Bilateral testing is considered an integral part of a complete examination. Limited examinations for reoccurring  indications may be performed as noted.  Duplex Findings: +--------------------+--------+--------+------+------------+ Mesenteric          PSV cm/sEDV cm/sPlaque  Comments   +--------------------+--------+--------+------+------------+ Aorta Prox             73                 2.3 x 2.7 cm +--------------------+--------+--------+------+------------+ Aorta Mid             103                              +--------------------+--------+--------+------+------------+ Aorta Distal           96                              +--------------------+--------+--------+------+------------+ Celiac Artery Origin  139                              +--------------------+--------+--------+------+------------+ SMA Proximal          142      22                      +--------------------+--------+--------+------+------------+    +------------------+--------+--------+-------+ Right Renal ArteryPSV cm/sEDV cm/sComment +------------------+--------+--------+-------+ Origin              299      42           +------------------+--------+--------+-------+ Proximal            257      43           +------------------+--------+--------+-------+ Mid                  100      31           +------------------+--------+--------+-------+ Distal               76                   +------------------+--------+--------+-------+ +-----------------+--------+--------+-------+ Left Renal ArteryPSV cm/sEDV cm/sComment +-----------------+--------+--------+-------+ Origin              93      25           +-----------------+--------+--------+-------+ Proximal           103      26           +-----------------+--------+--------+-------+ Mid                106      25           +-----------------+--------+--------+-------+ +------------+--------+--------+----+-----------+--------+--------+---+ Right KidneyPSV cm/sEDV cm/sRI  Left KidneyPSV cm/sEDV cm/sRI  +------------+--------+--------+----+-----------+--------+--------+---+ Upper Pole                      Upper Pole                     +------------+--------+--------+----+-----------+--------+--------+---+ Mid         25      7       0.                            +------------+--------+--------+----+-----------+--------+--------+---+ Lower Pole  30      7       0.75Lower Pole                     +------------+--------+--------+----+-----------+--------+--------+---+  Hilar       58      14      0.76Hilar                          +------------+--------+--------+----+-----------+--------+--------+---+ +------------------+--------+------------------+--------+ Right Kidney              Left Kidney                +------------------+--------+------------------+--------+ RAR                       RAR                        +------------------+--------+------------------+--------+ RAR (manual)      2.9     RAR (manual)      1.0      +------------------+--------+------------------+--------+ Cortex                    Cortex                     +------------------+--------+------------------+--------+ Cortex thickness  15.00 mmCorex thickness   14.00  mm +------------------+--------+------------------+--------+ Kidney length (cm)12.93   Kidney length (cm)         +------------------+--------+------------------+--------+  Summary: Largest Aortic Diameter: 2.7 cm  Renal:  Right: Normal size right kidney. Normal right Resisitive Index.        Normal cortical thickness of right kidney. RRV flow present.        1-59% stenosis of the right renal artery. High end of the        1-59% stenotic range. Left:  Normal cortical thickness of the left kidney. No evidence of        left renal artery stenosis.         The left kidney was largely obscured.  Mesenteric: Normal Celiac artery and Superior Mesenteric artery findings.  *See table(s) above for measurements and observations.  Diagnosing physician: Charlton Haws MD  Electronically signed by Charlton Haws MD on 01/11/2024 at 12:33:12 PM.    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  Oncotype Dx score 0.  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  Oncotype Dx score was 0 indicating low risk and no benefit of adjuvant chemotherapy.  Patient completed adjuvant XRT in February 2024.  Letrozole has been discontinued secondary to hot flashes the patient was given a prescription for anastrozole.  Continue anastrozole for total of 5 years completing treatment in February 2029.  Patient's most recent mammogram on September 17, 2023 was reported as BI-RADS 2.  Repeat in October 2025.  Return to clinic after her next mammogram with video-assisted telemedicine visit.   Bone health: Bone mineral density on February 22, 2023 reported T-score of -0.3 which is considered normal.  Repeat in March 2026. Hot flashes: Chronic and unchanged.  Monitor.   I provided 20 minutes of face-to-face video visit time during this encounter which included chart review,  counseling, and coordination of care as documented above.   Patient expressed understanding and was in agreement with this plan. She also understands that She can call clinic at any time with any questions, concerns, or complaints.    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

## 2024-02-05 ENCOUNTER — Other Ambulatory Visit: Payer: Self-pay

## 2024-02-05 ENCOUNTER — Encounter: Payer: Self-pay | Admitting: Internal Medicine

## 2024-02-05 ENCOUNTER — Ambulatory Visit (INDEPENDENT_AMBULATORY_CARE_PROVIDER_SITE_OTHER): Payer: Medicare HMO | Admitting: Internal Medicine

## 2024-02-05 VITALS — BP 132/84 | HR 64 | Temp 98.4°F | Ht 67.72 in | Wt 274.5 lb

## 2024-02-05 DIAGNOSIS — J3089 Other allergic rhinitis: Secondary | ICD-10-CM | POA: Diagnosis not present

## 2024-02-05 DIAGNOSIS — J4520 Mild intermittent asthma, uncomplicated: Secondary | ICD-10-CM

## 2024-02-05 DIAGNOSIS — L500 Allergic urticaria: Secondary | ICD-10-CM

## 2024-02-05 NOTE — Patient Instructions (Addendum)
 Mild Intermittent Asthma: - Rescue inhaler: Albuterol 2 puffs every 4-6 hours as needed for respiratory symptoms of shortness of breath, or wheezing Asthma control goals:  Full participation in all desired activities (may need albuterol before activity) Albuterol use two times or less a week on average (not counting use with activity) Cough interfering with sleep two times or less a month Oral steroids no more than once a year No hospitalizations  Other Allergic Rhinitis: - Use nasal saline rinses before nose sprays such as with Neilmed Sinus Rinse.  Use distilled water.   - Use Flonase 2 sprays each nostril daily. Aim upward and outward. - Use Azelastine 2 sprays each nostril twice daily as needed for runny nose, drainage, sneezing, congestion. Aim upward and outward. - Use Zyrtec 10 mg daily as needed for runny nose, sneezing, itchy watery eyes.   Urticaria (Hives): - At this time etiology of hives and swelling is unknown. Hives can be caused by a variety of different triggers including illness/infection, pressure, vibrations, extremes of temperature to name a few however majority of the time there is no identifiable trigger.  -If hives/swelling recur, start Zyrtec 10mg  twice daily.  -If no improvement in 2-3 days, add Pepcid 20mg  twice daily and continue Zyrtec 10mg  twice daily.  Food Allergy:  - please strictly avoid shellfish and fish.  - for SKIN only reaction, okay to take Benadryl 25mg  capsules every 6 hours as needed - for SKIN + ANY additional symptoms, OR IF concern for LIFE THREATENING reaction = Epipen Autoinjector EpiPen 0.3 mg. - If using Epinephrine autoinjector, call 911 or go to the ER.    Hold all anti-histamines (Xyzal, Allegra, Zyrtec, Claritin, Benadryl, Pepcid) 3 days prior to next visit.  Follow up: 9 AM on 3/18 for skin testing

## 2024-02-05 NOTE — Progress Notes (Signed)
 NEW PATIENT  Date of Service/Encounter:  02/05/24  Consult requested by: Emogene Morgan, MD   Subjective:   Heather Mcintyre (DOB: 1966/01/27) is a 58 y.o. female who presents to the clinic on 02/05/2024 with a chief complaint of Asthma, Food Allergy, Allergic Rhinitis , Urticaria, and Establish Care .    History obtained from: chart review and patient.   Asthma:  Reports asthma was worsen when she was younger but has been controlled in the recent years.  Does not need her albuterol too frequently.  Never controller inhaler.  Using rescue inhaler: unable to quantify exactly but not frequently.  Limitations to daily activity: none 0 ED visits/UC visits and 0 oral steroids in the past year 0 number of lifetime hospitalizations, 0 number of lifetime intubations.  Identified Triggers: not sure  Prior PFTs or spirometry: none Previously used therapies: none   Current regimen:  Maintenance: none Rescue: Albuterol 2 puffs q4-6 hrs PRN  Rhinitis:  Started since she was young.  Symptoms include: nasal congestion, rhinorrhea, post nasal drainage, and itchy eyes  Occurs seasonally-Spring Potential triggers: not sure   Treatments tried:  Flonase/Azelastine PRN  Previous allergy testing: no History of sinus surgery: no Nonallergic triggers: none    Concern for Food Allergy:  Foods of concern: fish, shellfish  History of reaction: hives  Previous allergy testing no  Carries an epinephrine autoinjector: yes  Chronic Urticaria/Angioedema: Symptoms: started age 53.    Angioedema Episodes:  Having both hives/swelling about 2-3 times a year and lasting about a week.  No scarring or pain  Zyrtec/benadryl PRN does help . Many times also requires oral prednisone.     Reviewed:  01/24/2024: seen by Dr Rozell Searing for snoring/daytime sleepiness.  Concern for OSA, discussed home sleep study.   01/22/2024: seen by Cardiology for itching without rash, thought to be related to  Diovan/blueberry. Referred to Allergy.   01/16/2024: seen in ED for itching and hives. Scattered urticaria noted on exam. No IV contrast was used; had an Echo and renal artery duplex done. Given atarax, solumedrol, pepcid.   Past Medical History: Past Medical History:  Diagnosis Date   Anxiety    Asthma    Breast cancer (HCC)    Cervical cancer (HCC)    Complication of anesthesia    woke up in the middle of surgery 2010   Degenerative joint disease/osteoarthritis    Depression    GERD (gastroesophageal reflux disease)    Hypertension    Lupus    Pre-diabetes     Past Surgical History: Past Surgical History:  Procedure Laterality Date   ABDOMINAL HYSTERECTOMY     BREAST BIOPSY Left 10/04/2022   [place] LT BREAST BX W LOC DEV 1ST LESION IMG BX SPEC US GUIDE 10/04/2022 [facility]   BREAST LUMPECTOMY,RADIO FREQ LOCALIZER,AXILLARY SENTINEL LYMPH NODE BIOPSY Left 11/07/2022   Procedure: BREAST LUMPECTOMY,RADIO FREQ LOCALIZER,AXILLARY SENTINEL LYMPH NODE BIOPSY;  Surgeon: Henrene Dodge, MD;  Location: [facility];  Service: General;  Laterality: Left;   CHOLECYSTECTOMY N/A    COLONOSCOPY WITH ESOPHAGOGASTRODUODENOSCOPY (EGD)     INCISION AND DRAINAGE ABSCESS Left 04/11/2023   Procedure: INCISION AND DRAINAGE ABSCESS, breast;  Surgeon: Henrene Dodge, MD;  Location: [facility];  Service: General;  Laterality: Left;   JOINT REPLACEMENT     LAPAROSCOPIC GASTRIC BAND REMOVAL WITH LAPAROSCOPIC GASTRIC SLEEVE RESECTION     REPLACEMENT TOTAL KNEE BILATERAL     TUBAL LIGATION      Family History: Family History  Problem Relation Age of Onset   Hypertension Mother    Hypertension Father    Breast cancer Neg Hx     Social History:  Flooring in bedroom: wood Pets: none Tobacco use/exposure: none Job: cook   Medication List:  Allergies as of 02/05/2024       Reactions   Cephalexin Hives, Other (See Comments)   Etodolac Hives   Iodinated Contrast Media Hives   Latex Hives, Other (See  Comments)   Penicillin G Hives   Fish-derived Products Other (See Comments)   Iodine Hives   Penicillins    Shellfish Allergy Hives   Lisinopril Other (See Comments)        Medication List        Accurate as of February 05, 2024 11:56 AM. If you have any questions, ask your nurse or doctor.          acetaminophen 500 MG tablet Commonly known as: TYLENOL Take 2 tablets (1,000 mg total) by mouth every 6 (six) hours as needed for mild pain.   ALOE VERA PO Take 1 tablet by mouth daily. Natural aloe   anastrozole 1 MG tablet Commonly known as: ARIMIDEX TAKE 1 TABLET(1 MG) BY MOUTH DAILY   ascorbic acid 500 MG tablet Commonly known as: VITAMIN C Take 500 mg by mouth daily.   azelastine 0.1 % nasal spray Commonly known as: ASTELIN Place 2 sprays into both nostrils 2 (two) times daily.   B COMPLEX VITAMINS PO Take 1 tablet by mouth daily.   Byetta 5 MCG Pen 5 MCG/0.02ML Sopn injection Generic drug: exenatide Inject 5 mcg into the skin 2 (two) times daily with a meal.   carvedilol 12.5 MG tablet Commonly known as: COREG Take 1 tablet (12.5 mg total) by mouth 2 (two) times daily.   celecoxib 200 MG capsule Commonly known as: CeleBREX Take 1 capsule (200 mg total) by mouth 2 (two) times daily as needed for moderate pain.   cetirizine 10 MG tablet Commonly known as: ZYRTEC Take 10 mg by mouth daily.   colchicine 0.6 MG tablet Take 1 tablet (0.6 mg total) by mouth daily.   diphenhydrAMINE 25 mg capsule Commonly known as: BENADRYL Take 25 mg by mouth every 6 (six) hours as needed.   EPINEPHrine 0.3 mg/0.3 mL Soaj injection Commonly known as: EPI-PEN Inject 0.3 mg into the muscle.   famotidine 20 MG tablet Commonly known as: Pepcid Take 1 tablet (20 mg total) by mouth 2 (two) times daily.   fluticasone 50 MCG/ACT nasal spray Commonly known as: FLONASE Place 1 spray into both nostrils daily.   furosemide 40 MG tablet Commonly known as: LASIX Take 40 mg by  mouth daily as needed.   gabapentin 300 MG capsule Commonly known as: NEURONTIN Take 300 mg by mouth at bedtime.   hydrochlorothiazide 25 MG tablet Commonly known as: HYDRODIURIL Take 0.5 tablets (12.5 mg total) by mouth daily.   hydrOXYzine 10 MG tablet Commonly known as: ATARAX Take 10 mg by mouth 2 (two) times daily.   linaclotide 290 MCG Caps capsule Commonly known as: LINZESS Take 290 mcg by mouth daily before breakfast.   montelukast 10 MG tablet Commonly known as: SINGULAIR Take 10 mg by mouth at bedtime.   multivitamin-lutein Caps capsule Take 1 capsule by mouth as needed.   omeprazole 40 MG capsule Commonly known as: PRILOSEC PRN   predniSONE 20 MG tablet Commonly known as: DELTASONE 3 tablets daily x 4 days   tiZANidine 4 MG tablet  Commonly known as: ZANAFLEX Take 4 mg by mouth every 8 (eight) hours as needed for muscle spasms.   Ventolin HFA 108 (90 Base) MCG/ACT inhaler Generic drug: albuterol Inhale 2 puffs into the lungs every 6 (six) hours as needed.         REVIEW OF SYSTEMS: Pertinent positives and negatives discussed in HPI.   Objective:   Physical Exam: BP 132/84 (BP Location: Right Leg, Patient Position: Sitting, Cuff Size: Large)   Pulse 64   Temp 98.4 F (36.9 C) (Temporal)   Ht 5' 7.72" (1.72 m)   Wt 274 lb 8 oz (124.5 kg)   SpO2 97%   BMI 42.09 kg/m  Body mass index is 42.09 kg/m. GEN: alert, well developed HEENT: clear conjunctiva, TM grey and translucent, nose with + mild inferior turbinate hypertrophy, pink nasal mucosa, slight clear rhinorrhea, + cobblestoning HEART: regular rate and rhythm, no murmur LUNGS: clear to auscultation bilaterally, no coughing, unlabored respiration ABDOMEN: soft, non distended  SKIN: no rashes or lesions  Spirometry:  Tracings reviewed. Her effort: Good reproducible efforts. FVC: 2.89L, 94% predicted FEV1: 2.34L, 97% predicted FEV1/FVC ratio: 81% Interpretation: Spirometry consistent  with normal pattern.  Please see scanned spirometry results for details.   Assessment:   1. Other allergic rhinitis   2. 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 Mild intermittent asthma without complication     Plan/Recommendations:  Mild Intermittent Asthma: - Controlled with infrequent use of Albuterol. Spirometry today was normal.  - Maintenance inhaler: none  - Rescue inhaler: Albuterol 2 puffs every 4-6 hours as needed for respiratory symptoms of shortness of breath, or wheezing Asthma control goals:  Full participation in all desired activities (may need albuterol before activity) Albuterol use two times or less a week on average (not counting use with activity) Cough interfering with sleep two times or less a month Oral steroids no more than once a year No hospitalizations  Other Allergic Rhinitis: - Due to turbinate hypertrophy, seasonal symptoms, recurrent hives and unresponsive to over the counter meds, will perform skin testing to identify aeroallergen triggers.   - Use nasal saline rinses before nose sprays such as with Neilmed Sinus Rinse.  Use distilled water.   - Use Flonase 2 sprays each nostril daily. Aim upward and outward. - Use Azelastine 2 sprays each nostril twice daily as needed for runny nose, drainage, sneezing, congestion. Aim upward and outward. - Use Zyrtec 10 mg daily as needed for runny nose, sneezing, itchy watery eyes.  - Hold all anti-histamines (Xyzal, Allegra, Zyrtec, Claritin, Benadryl, Pepcid) 3 days prior to next visit.   Urticaria (Hives): - At this time etiology of hives and swelling is unknown. Hives can be caused by a variety of different triggers including illness/infection, pressure, vibrations, extremes of temperature to name a few however majority of the time there is no identifiable trigger.  -If hives recur, start Zyrtec 10mg  twice daily.  -If no improvement in 2-3 days, add Pepcid 20mg  twice daily and continue Zyrtec 10mg  twice  daily.  Food Allergy:  - please strictly avoid shellfish and fish.  No interested in reintroduction.  - Initial rxn: hives with seafood ingestion  - for SKIN only reaction, okay to take Benadryl 25mg  capsules every 6 hours as needed - for SKIN + ANY additional symptoms, OR IF concern for LIFE THREATENING reaction = Epipen Autoinjector EpiPen 0.3 mg. - If using Epinephrine autoinjector, call 911 or go to the ER.    Hold all  anti-histamines (Xyzal, Allegra, Zyrtec, Claritin, Benadryl, Pepcid) 3 days prior to next visit. Follow up: 9 AM on 3/18 for skin testing 1-68, shellfish individual + mix, fish individual + mix. IDs okay   Alesia Morin, MD [facility]

## 2024-02-06 ENCOUNTER — Other Ambulatory Visit: Payer: Self-pay | Admitting: *Deleted

## 2024-02-06 DIAGNOSIS — C50912 Malignant neoplasm of unspecified site of left female breast: Secondary | ICD-10-CM

## 2024-02-09 NOTE — Progress Notes (Unsigned)
 Cardiology Clinic Note   Date: 02/12/2024 ID: Heather Mcintyre, DOB 14-Apr-1966, MRN [id number]  Primary Cardiologist:  Bryan Lemma, MD  Chief Complaint   Heather Mcintyre is a 58 y.o. female who presents to the clinic today for follow up after medication changes.   Patient Profile   Heather Mcintyre is followed by Dr. Herbie Baltimore for the history outlined below.       Past medical history significant for: Dyspnea/chest pain. Echo 01/11/2024: EF 55 to 60%.  No RWMA.  Normal diastolic parameters.  Normal RV size/function.  Mild MR. Bradycardia. Hypertension. Renal artery ultrasound 01/11/2024: Right: Normal size kidney, resistive index, cortical thickness.  1 to 59% stenosis of the renal artery (high end of the stenotic range).  Left: Normal cortical thickness.  No evidence of renal artery stenosis.  The left kidney was largely obscured. GERD. Left breast cancer. S/p lumpectomy with sentinel node biopsy 11/07/2022.  In summary, patient was first evaluated by Dr. Herbie Baltimore on 12/13/2022 for bradycardia and occasional chest pain at the request of Dr. Letta Pate.  Patient described brief pressure-like sensation in her chest that typically occur while at rest and sometimes with sleeping.  Discomfort is not exertional and not necessarily associated with dyspnea.  She expressed concern over low heart rates occurring during her recent procedures with rates as low as in the 40s.  She denied palpitations, lightheadedness, dizziness, near-syncope, syncope.  A ZIO monitor was ordered but patient chose not to wear it.  Upon follow-up in June 2024 patient continued to have bradycardia.  Her dose of metoprolol was decreased and Diovan was added.  Patient presented to the ED on 12/10/2023 with complaints of elevated BP.  Patient reported PCP switched her medications and she had been unable to control her BP since.  Labs at that time showed normal blood counts and electrolytes, creatinine 1.14, BUN 19.  Troponin 30>>  26.  Patient left without being seen.  Patient was last seen in the office by Dr. Herbie Baltimore on 12/20/2023 for evaluation of elevated BP.  Patient reported blood pressure readings as high as 235/100 over the past month.  She self increased metoprolol dose to 150 mg daily.  She also reported previously tried valsartan in the past but discontinued it due to feeling unwell.  She reported lower extremity edema L>R, DOE, exercise intolerance, and chronic chest pain.  Chest pain is localized to the surgical site of her lumpectomy.  BP at the time of her visit was 152/80 and 146/78 on recheck.  Echo and renal artery ultrasound were ordered (detailed above).  Toprol was decreased to 100 mg daily and Diovan was added.  Patient presented to ED on 12/27/2023 for elevated BP to 200/89 and chest pressure. D-dimer was elevated. VQ scan was negative for PE. Troponin 36>>35>>33.   Patient presented to ED on 01/15/2024 with complaint of itching. She denied difficulty breathing or swallowing. She was treated with atarax, solu-medrol and Pepcid. She took benadryl prior to arrival.    Patient was last seen in the office by me on 01/22/2024 for follow-up after medication changes and testing.  Unfortunately patient developed widespread itching without rash and was holding Diovan since the day prior.  She reports taking valsartan in the past with a feeling of general malaise.  She had also recently started Byetta and was uncertain if this could be causing her reaction.  She also reported eating a lot of blueberries recently.  She had never been through allergy testing and was  referred to an allergist.  BP was elevated at 165/100 on intake and 160/90 on my recheck.  Home BP readings as high as 180/100 recently.  She reported chronic vision fluctuations uncertain if it was related to elevated BP. She had a Stop Bang Score of 6 and was referred to pulmonology for sleep study.  Toprol and Diovan was stopped.  Patient was started on  carvedilol and hydrochlorothiazide.  She was instructed to keep a BP log.  Patient was evaluated by sleep medicine on 01/24/2024 and plan for home sleep study.  She was seen by Dr. Allena Katz, allergy and immunology, on 02/05/2024.  At that time she was noted to have hives.  She was provided with nasal spray and instructed to take Zyrtec and if no improvement in hives in 2 to 3 days at Pepcid twice daily.  She was scheduled for skin testing at her next visit.     History of Present Illness    Today, patient reports she is feeling improved compared to last visit. She was unable to take hydrochlorothiazide, as it caused increased itching. She has been taking carvedilol with good tolerance and improvement in BP. She was evaluated by allergy and immunology and is pending skin testing. Hives and itching improved on a course of steroids. Patient denies shortness of breath, dyspnea on exertion, lower extremity edema, orthopnea or PND. No chest pain, pressure, or tightness. No palpitations.  Had a long discussion about the use of colchicine with carvedilol. She has not taken colchicine in quite some time. She states she will get gout flares when she eats red meat only. Suggested trying Toprol again vs a different medication for gout. She is going to talk to her PCP about utilizing a different medication for gout flares as she feels her BP has been best controlled with carvedilol.     ROS: All other systems reviewed and are otherwise negative except as noted in History of Present Illness.  EKGs/Labs Reviewed        12/27/2023: BUN 18; Creatinine, Ser 0.93; Potassium 3.9; Sodium 140   12/27/2023: Hemoglobin 13.4; WBC 6.6    Risk Assessment/Calculations         STOP-Bang Score:  6       Physical Exam    VS:  BP 128/82 (BP Location: Right Arm, Patient Position: Sitting, Cuff Size: Large)   Pulse 72   Ht 5\' 8"  (1.727 m)   Wt 276 lb 12.8 oz (125.6 kg)   SpO2 98%   BMI 42.09 kg/m  , BMI Body mass index is  42.09 kg/m.  GEN: Well nourished, well developed, in no acute distress. Neck: No JVD or carotid bruits. Cardiac: RRR. No murmurs. No rubs or gallops.   Respiratory:  Respirations regular and unlabored. Clear to auscultation without rales, wheezing or rhonchi. GI: Soft, nontender, nondistended. Extremities: Radials/DP/PT 2+ and equal bilaterally. No clubbing or cyanosis. No edema.  Skin: Warm and dry, no rash. Neuro: Strength intact.  Assessment & Plan   Dyspnea Echo February 2025 showed normal LV/RV function, no RWMA, normal diastolic parameters, mild MR.  Patient denies dyspnea today. Breath sounds clear to auscultation.  -No further testing indicated at this time.    Bradycardia Patient has noted HR in the 40s at times.  At visit with Dr. Herbie Baltimore in January patient had been taking metoprolol 150 mg.  HR today 72 bpm on current dose of carvedilol.  -Continue carvedilol.   Hypertension Renal artery ultrasound February 2025 demonstrated 1 to  59% stenosis of right renal artery and no stenosis of left renal artery however left kidney was poorly visualized.  At follow-up visit February 2025 patient deferred CT for further evaluation of renal arteries given poor quality of [place].  BP today 128/82. Home BP has been well controlled per patient report. She was unable to tolerate hydrochlorothiazide, as it caused itching. Had a long discussion about the use of colchicine being contraindicated with carvedilol. She will speak to PCP about a different medication for gout flares, as she feels BP has been under best control since switching to carvedilol.  -Continue carvedilol.   Itching and rash Patient was evaluated by allergist 1 week ago.  At that time she had broken out in hives.  She was instructed to start nasal spray and Zyrtec and add Pepcid if hives did not resolve.  She reports improvement in hives and itching since competing a course of steroids. She is pending skin testing.  -Continue to  follow with allergist. -Keep scheduled visit for allergy testing.   Sleep disordered breathing Patient reports she has been told she snores and stops breathing in her sleep. She constantly feels somnolent. Stop bang score 6.  She is pending home sleep study results.  -Continue to follow with pulmonology.  Disposition: Return in 6 months or sooner as needed.          Signed, Etta Grandchild. Carolin Quang, DNP, NP-C

## 2024-02-11 IMAGING — US US RENAL
1 series · 14 of 24 positions shown · non-contrast
Comparison: None Available.

CLINICAL DATA: Microscopic hematuria.

EXAM:
RENAL / URINARY TRACT ULTRASOUND COMPLETE

[Series 1: us renal · 14 of 24 slices shown]
[im 1/24]
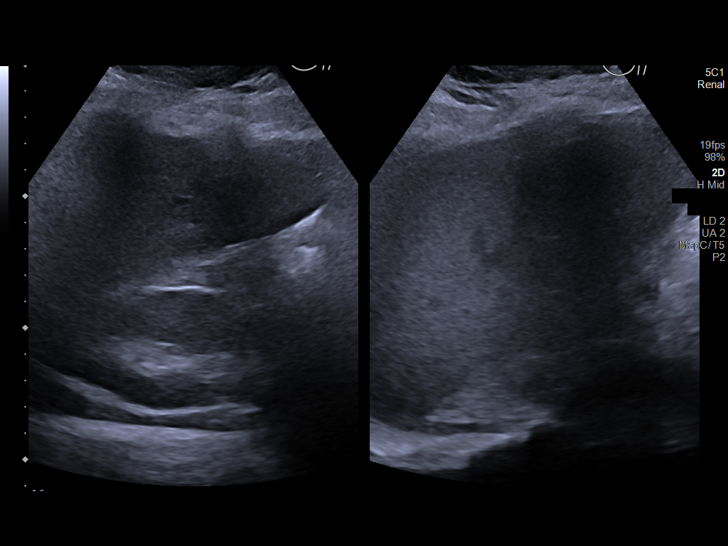
[im 3/24]
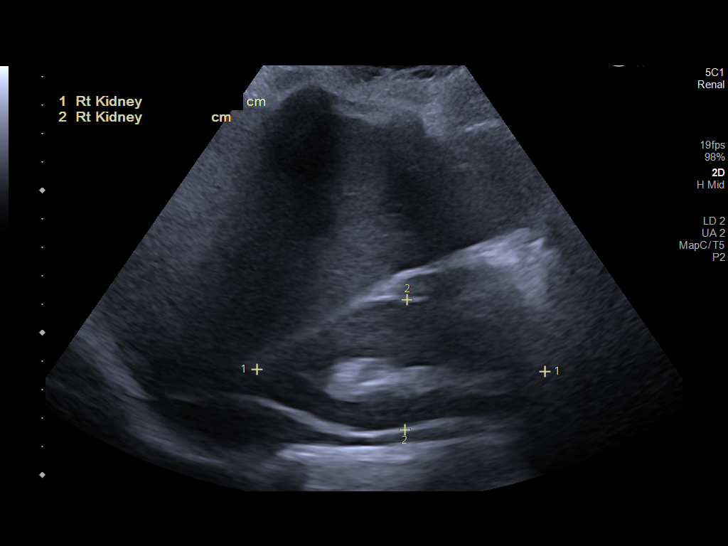
[im 5/24]
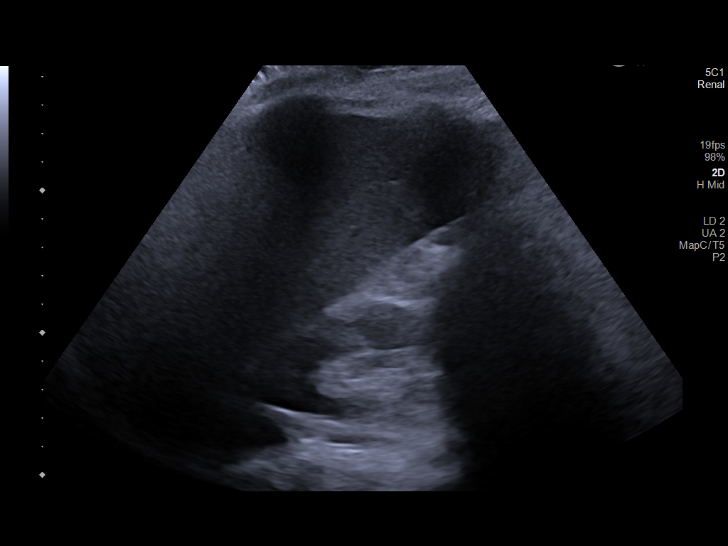
[im 7/24]
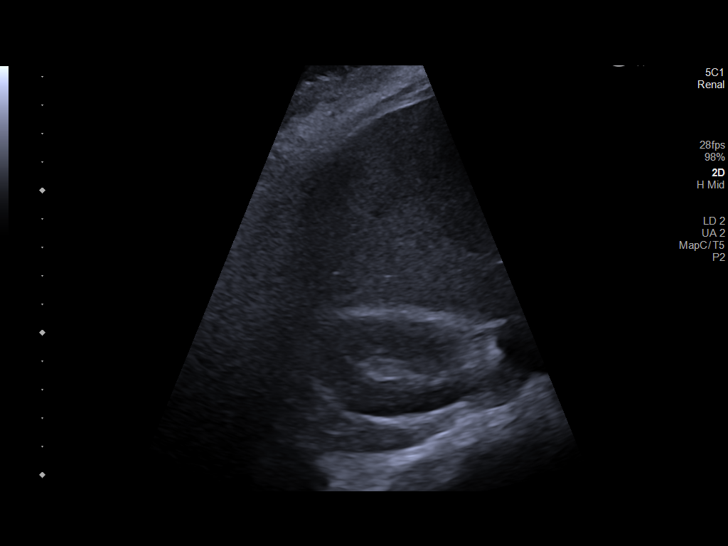
[im 8/24]
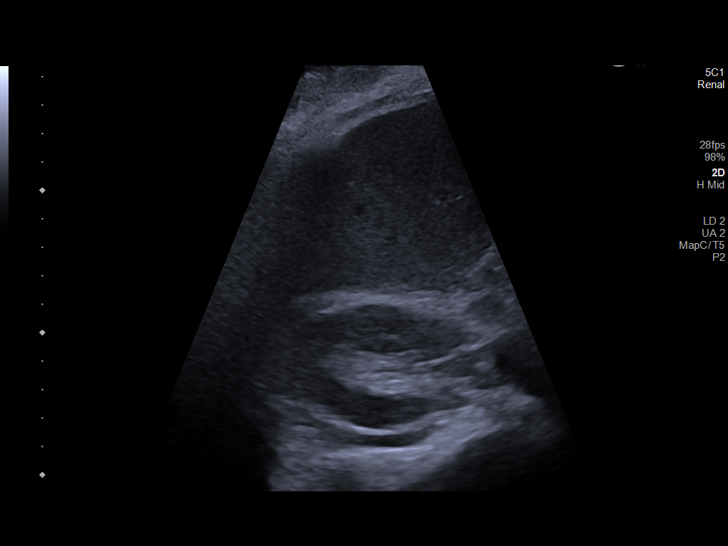
[im 10/24]
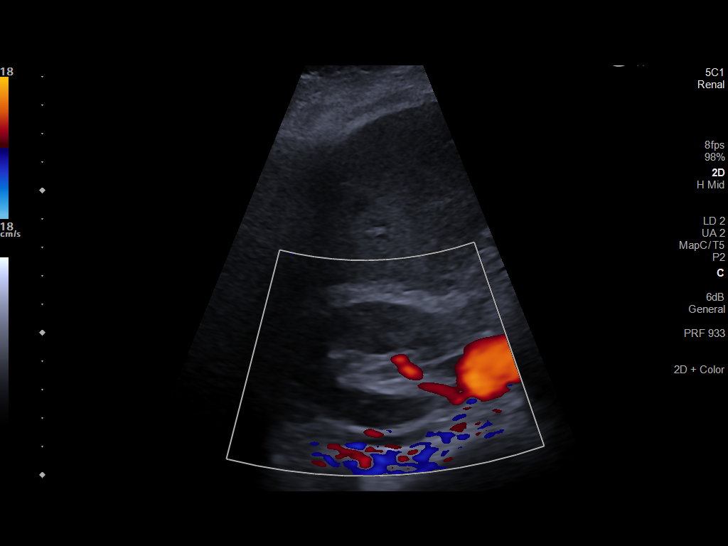
[im 12/24]
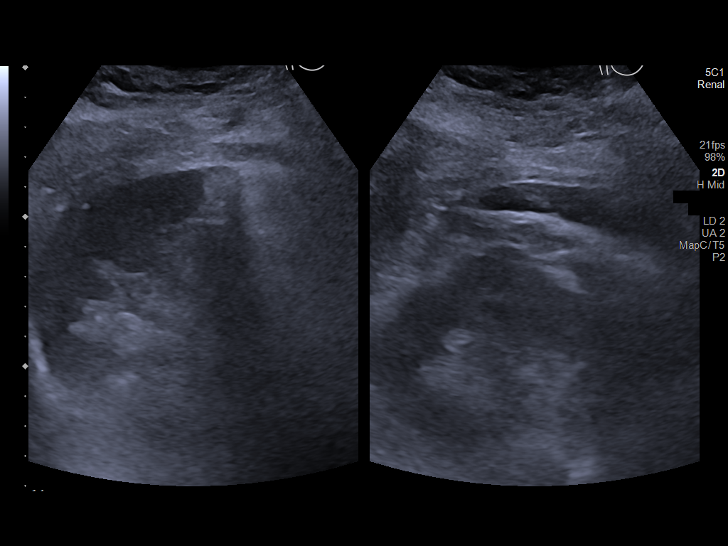
[im 13/24]
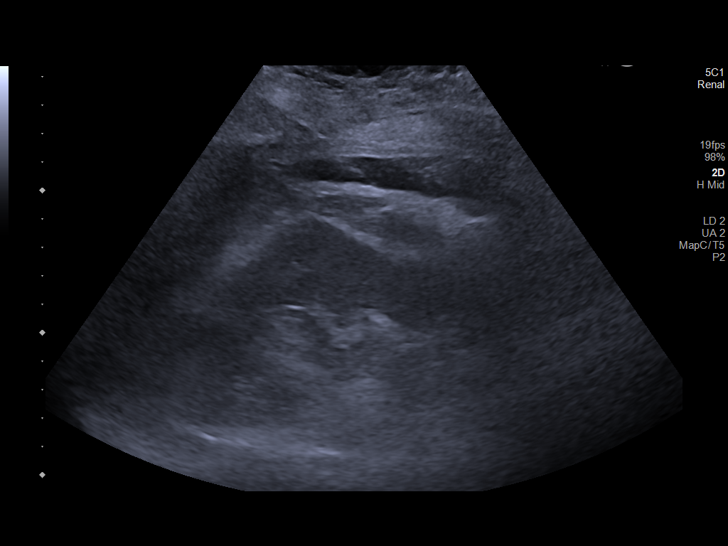
[im 15/24]
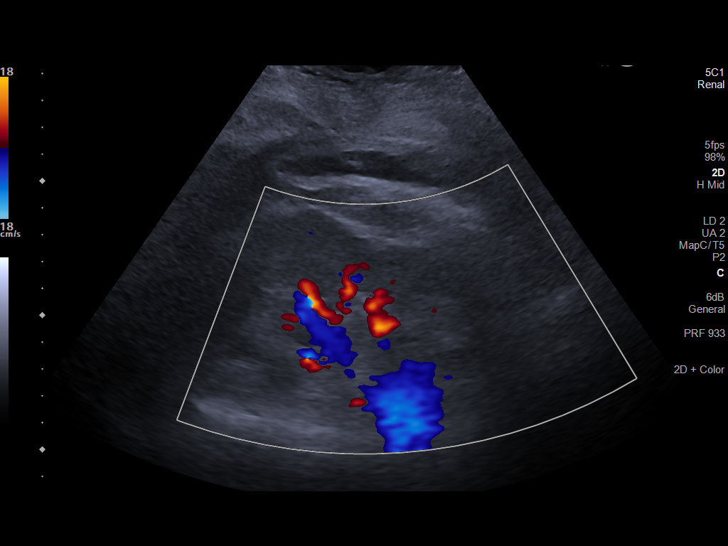
[im 17/24]
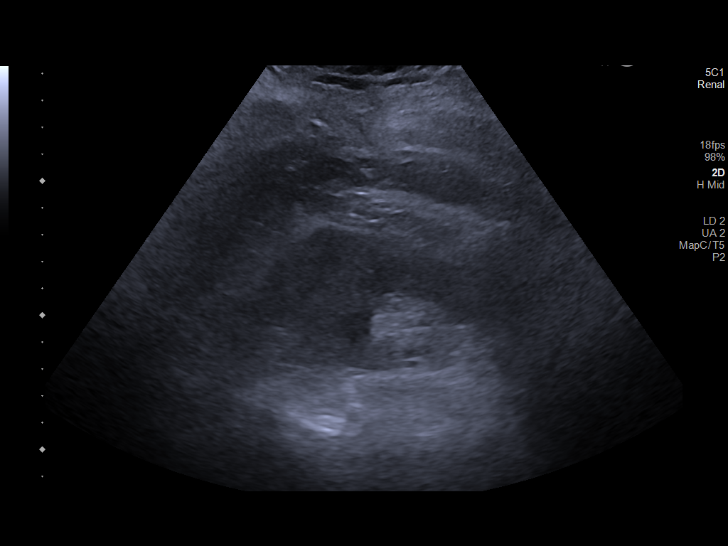
[im 19/24]
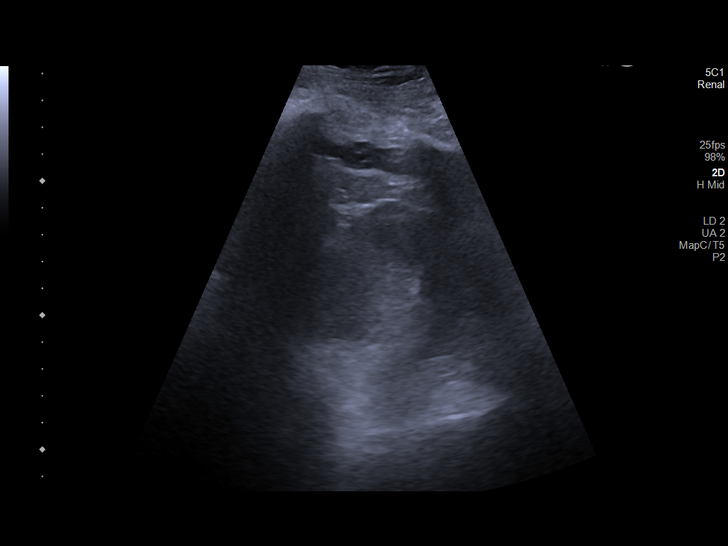
[im 20/24]
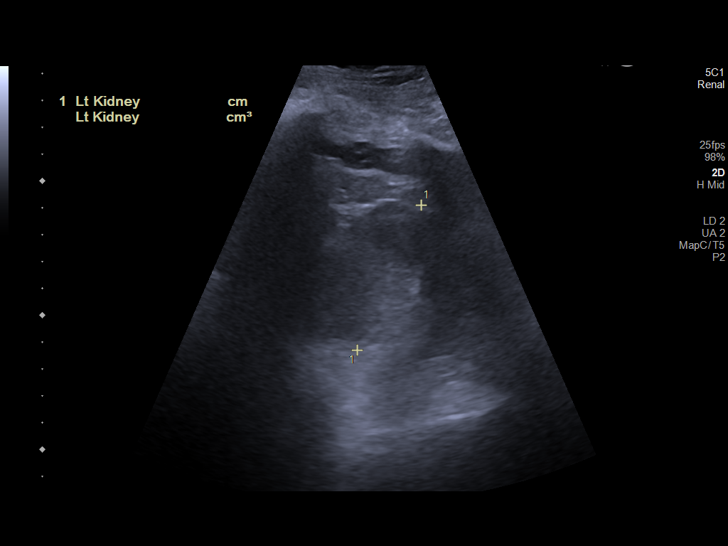
[im 22/24]
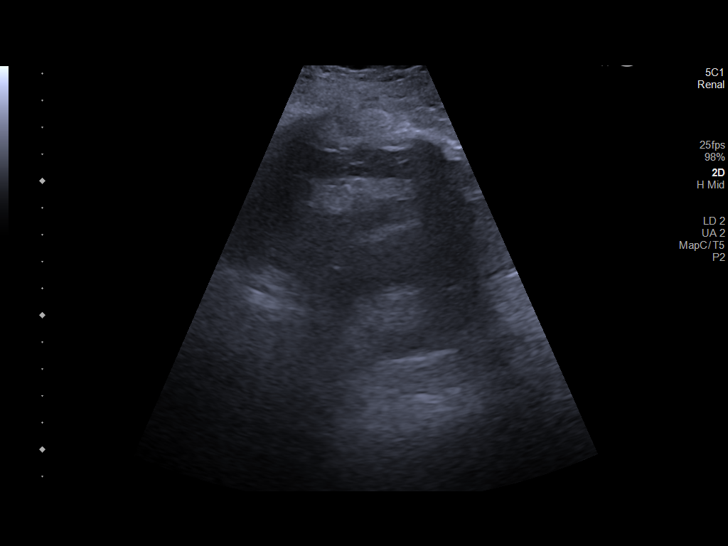
[im 24/24]
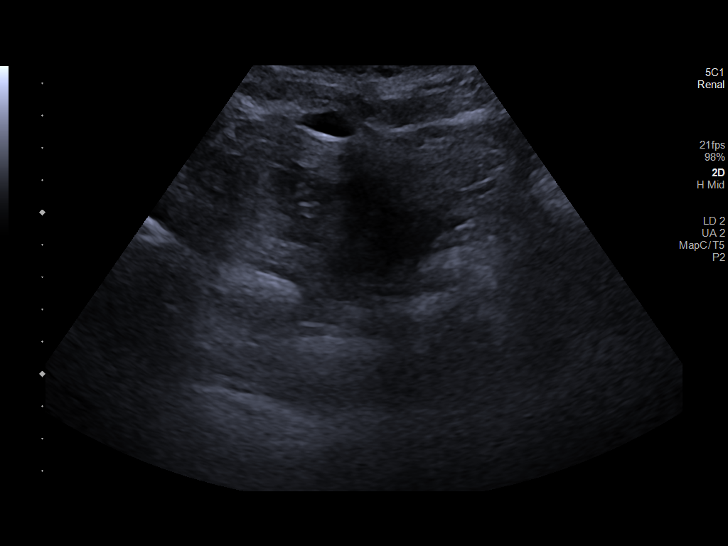

[14 of 24 positions shown; findings below may reference images not displayed]

FINDINGS: Right Kidney:

Renal measurements: 10.1 x 4.6 x 6.0 cm = volume: 144.3 mL.
Echogenicity within normal limits. No mass or hydronephrosis
visualized.

Left Kidney:

Renal measurements: 11.7 x 6.6 x 5.9 cm = volume: 238.3 mL. Possible
mild increased echogenicity. Normal thickness. No hydronephrosis.

Bladder:

Appears normal for degree of bladder distention.

Other:

None.
IMPRESSION: The left kidney is not as well visualized however there is
suggestion of mild increased echogenicity of the renal cortex
suggesting the possibility of chronic medical renal disease.

No hydronephrosis.

## 2024-02-12 ENCOUNTER — Ambulatory Visit: Payer: Medicare HMO | Attending: Student | Admitting: Student

## 2024-02-12 ENCOUNTER — Encounter: Payer: Self-pay | Admitting: Student

## 2024-02-12 ENCOUNTER — Ambulatory Visit: Admitting: Internal Medicine

## 2024-02-12 VITALS — BP 128/82 | HR 72 | Ht 68.0 in | Wt 276.8 lb

## 2024-02-12 DIAGNOSIS — I10 Essential (primary) hypertension: Secondary | ICD-10-CM

## 2024-02-12 DIAGNOSIS — L509 Urticaria, unspecified: Secondary | ICD-10-CM

## 2024-02-12 DIAGNOSIS — R001 Bradycardia, unspecified: Secondary | ICD-10-CM | POA: Diagnosis not present

## 2024-02-12 DIAGNOSIS — R0609 Other forms of dyspnea: Secondary | ICD-10-CM | POA: Diagnosis not present

## 2024-02-12 DIAGNOSIS — G4730 Sleep apnea, unspecified: Secondary | ICD-10-CM

## 2024-02-12 DIAGNOSIS — L299 Pruritus, unspecified: Secondary | ICD-10-CM | POA: Diagnosis not present

## 2024-02-12 NOTE — Patient Instructions (Signed)
 Medication Instructions:  Your physician recommends the following medication changes.  STOP TAKING: Colchicine - Please talk to your PCP for alternatives (maybe allopurinol?) --- DO NOT mix with Carvedilol  *If you need a refill on your cardiac medications before your next appointment, please call your pharmacy*   Lab Work: None ordered at this time    Follow-Up: At [facility], you and your health needs are our priority.  As part of our continuing mission to provide you with exceptional heart care, we have created designated Provider Care Teams.  These Care Teams include your primary Cardiologist (physician) and Advanced Practice Providers (APPs -  Physician Assistants and Nurse Practitioners) who all work together to provide you with the care you need, when you need it.  Your next appointment:   6 month(s)  Provider:   You may see Bryan Lemma, MD or Carlos Levering, NP

## 2024-02-13 ENCOUNTER — Other Ambulatory Visit: Payer: Self-pay

## 2024-02-13 ENCOUNTER — Encounter

## 2024-02-13 DIAGNOSIS — G4733 Obstructive sleep apnea (adult) (pediatric): Secondary | ICD-10-CM

## 2024-02-13 DIAGNOSIS — R0683 Snoring: Secondary | ICD-10-CM | POA: Diagnosis not present

## 2024-02-13 MED ORDER — ANASTROZOLE 1 MG PO TABS: 1.0000 mg | ORAL_TABLET | Freq: Every day | ORAL | 3 refills | Status: AC

## 2024-02-18 ENCOUNTER — Telehealth: Payer: Self-pay

## 2024-02-18 DIAGNOSIS — G4733 Obstructive sleep apnea (adult) (pediatric): Secondary | ICD-10-CM

## 2024-02-18 NOTE — Telephone Encounter (Signed)
-----   Message from [facility] D REDDY sent at 02/18/2024 10:57 AM EDT ----- Regarding: HST results Please notify patient that HST revealed severe OSA, recommend starting on APAP therapy set to 4-16 cm H2O, EPR 3, with the AirTouch N30i nasal mask. Please also schedule a 3 month CPAP follow up visit. Thanks ----- Message ----- From: Lilian Kapur Sent: 02/13/2024   6:08 PM EDT To: Alanda Slim, MD

## 2024-02-18 NOTE — Addendum Note (Signed)
 Addended by: Hyacinth Meeker on: 02/18/2024 01:40 PM   Modules accepted: Orders

## 2024-02-19 ENCOUNTER — Ambulatory Visit (INDEPENDENT_AMBULATORY_CARE_PROVIDER_SITE_OTHER): Admitting: Internal Medicine

## 2024-02-19 DIAGNOSIS — J3089 Other allergic rhinitis: Secondary | ICD-10-CM | POA: Diagnosis not present

## 2024-02-19 DIAGNOSIS — L500 Allergic urticaria: Secondary | ICD-10-CM | POA: Diagnosis not present

## 2024-02-19 DIAGNOSIS — J301 Allergic rhinitis due to pollen: Secondary | ICD-10-CM

## 2024-02-19 MED ORDER — AZELASTINE HCL 0.1 % NA SOLN: 2.0000 | Freq: Two times a day (BID) | NASAL | 5 refills | Status: AC | PRN

## 2024-02-19 MED ORDER — MONTELUKAST SODIUM 10 MG PO TABS: 10.0000 mg | ORAL_TABLET | Freq: Every day | ORAL | 5 refills | Status: AC

## 2024-02-19 MED ORDER — FLUTICASONE PROPIONATE 50 MCG/ACT NA SUSP: 2.0000 | Freq: Every day | NASAL | 5 refills | Status: AC

## 2024-02-19 MED ORDER — CETIRIZINE HCL 10 MG PO TABS: 10.0000 mg | ORAL_TABLET | Freq: Two times a day (BID) | ORAL | 5 refills | Status: AC | PRN

## 2024-02-19 NOTE — Progress Notes (Signed)
 FOLLOW UP Date of Service/Encounter:  02/19/24   Subjective:  Heather Mcintyre (DOB: June 28, 1966) is a 58 y.o. female who [facility] for follow up for skin testing.   History obtained from: chart review and patient.  Anti histamines held.  Reports having itchy mouth with eating citrus fruits like lime/lemon and strawberry but only with certain types of fruits and if eaten in larger amounts. No other GI/respiratory/cutaneous symptoms.   Past Medical History: Past Medical History:  Diagnosis Date   Anxiety    Asthma    Breast cancer (HCC)    Cervical cancer (HCC)    Complication of anesthesia    woke up in the middle of surgery 2010   Degenerative joint disease/osteoarthritis    Depression    GERD (gastroesophageal reflux disease)    Hypertension    Lupus    Pre-diabetes     Objective:  There were no vitals taken for this visit. There is no height or weight on file to calculate BMI. Physical Exam: GEN: alert, well developed HEENT: clear conjunctiva, MMM LUNGS: unlabored respiration  Skin Testing:  Skin prick testing was placed, which includes aeroallergens/foods, histamine control, and saline control.  Verbal consent was obtained prior to placing test.  Patient tolerated procedure well.  Allergy testing results were read and interpreted by myself, documented by clinical staff. Adequate positive and negative control.  Positive results to:  Results discussed with patient/family.  Airborne Adult Perc - 02/19/24 0905     Time Antigen Placed 1610    Allergen Manufacturer Waynette Buttery    Location Back    Number of Test 55    Panel 1 Select    1. Control-Buffer 50% Glycerol Negative    2. Control-Histamine 3+    3. Bahia Negative    4. French Southern Territories Negative    5. Johnson Negative    6. Kentucky Blue Negative    7. Meadow Fescue 3+    8. Perennial Rye 3+    9. Timothy 3+    10. Ragweed Mix Negative    11. Cocklebur Negative    12.  Plantain,  English Negative    13. Baccharis Negative    14. Dog Fennel Negative    15. Russian Thistle Negative    16. Lamb's Quarters Negative    17. Sheep Sorrell Negative    18. Rough Pigweed Negative    19. Marsh Elder, Rough Negative    20. Mugwort, Common Negative    21. Box, Elder Negative    22. Cedar, red Negative    23. Sweet Gum Negative    24. Pecan Pollen Negative    25. Pine Mix Negative    26. Walnut, Black Pollen Negative    27. Red Mulberry Negative    28. Ash Mix Negative    29. Birch Mix Negative    30. Beech American Negative    31. Cottonwood, Guinea-Bissau Negative    32. Hickory, White Negative    33. Maple Mix Negative    34. Oak, Guinea-[place] Mix Negative    35. Sycamore Eastern Negative    36. Alternaria Alternata Negative    37. Cladosporium Herbarum Negative    38. Aspergillus Mix Negative    39. Penicillium Mix Negative    40. Bipolaris Sorokiniana (Helminthosporium) Negative    41. Drechslera Spicifera (Curvularia) Negative    42. Mucor Plumbeus Negative    43. Fusarium Moniliforme Negative    44. Aureobasidium Pullulans (  pullulara) Negative    45. Rhizopus Oryzae Negative    46. Botrytis Cinera Negative    47. Epicoccum Nigrum Negative    48. Phoma Betae Negative    49. Dust Mite Mix 3+    50. Cat Hair 10,000 BAU/ml Negative    51.  Dog Epithelia Negative    52. Mixed Feathers Negative    53. Horse Epithelia Negative    54. Cockroach, German 3+    55. Tobacco Leaf Negative    1. Fire [employer] Omitted    2. Other Omitted    3. Other Omitted             Intradermal - 02/19/24 0943     Time Antigen Placed 9518    Allergen Manufacturer Waynette Buttery    Location Arm    Number of Test 13    Intradermal Select    Control Negative    Bahia Negative    French Southern Territories Negative    Johnson Negative    Ragweed Mix Negative    Weed Mix Negative    Tree Mix Negative    Mold 1 2+    Mold 2 2+    Mold 3 Negative    Mold 4 2+    Cat 3+    Dog Negative              Food Adult Perc - 02/19/24 0900     Time Antigen Placed 8416    Allergen Manufacturer Waynette Buttery    Location Back    Number of allergen test 23    1. Peanut Negative    2. Soybean Negative    3. Wheat Negative    4. Sesame Negative    5. Milk, Cow Negative    6. Casein Negative    7. Egg White, Chicken Negative    8. Shellfish Mix --   3x4   9. Fish Mix Negative    10. Cashew Negative    11. Walnut Food Negative    12. Almond Negative    13. Hazelnut Negative    18. Trout Negative    19. Tuna Negative    20. Salmon Negative    21. Flounder Negative    22. Codfish --   3x2   23. Shrimp Negative    24. Crab Negative    25. Lobster Negative    26. Oyster Negative    27. Scallops Negative              Assessment:   1. Seasonal allergic rhinitis due to pollen   2. Allergic rhinitis due to dust mite   3. 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 Allergic rhinitis due to insect     Plan/Recommendations:  Allergic Rhinitis: - Due to turbinate hypertrophy, seasonal symptoms, recurrent hives and unresponsive to over the counter meds, will perform skin testing to identify aeroallergen triggers.   - SPT 01/2024: positive to grasses, molds, cats, dust mites, cockroach  - Use nasal saline rinses before nose sprays such as with Neilmed Sinus Rinse.  Use distilled water.   - Use Flonase 2 sprays each nostril daily. Aim upward and outward. - Use Azelastine 2 sprays each nostril twice daily as needed for runny nose, drainage, sneezing, congestion. Aim upward and outward. - Use Zyrtec 10 mg daily.  - Use Singulair (Montelukast) 10mg  daily.  Stop if you have any mood changes with this.  - Consider allergy shots as long term control of your symptoms by teaching  your immune system to be more tolerant of your allergy triggers  Urticaria (Hives): - At this time etiology of hives and swelling is unknown. Hives can be caused by a variety of different triggers including  illness/infection, pressure, vibrations, extremes of temperature to name a few however majority of the time there is no identifiable trigger.  - SPT 01/2024: negative to commonly allergenic foods except shellfish/fish.  - Start Zyrtec 10mg  daily. - If hives recur, increase to Zyrtec 10mg  twice daily.  - If no improvement in 2-3 days, add Pepcid 20mg  twice daily and continue Zyrtec 10mg  twice daily.  Mild Intermittent Asthma: - Rescue inhaler: Albuterol 2 puffs every 4-6 hours as needed for respiratory symptoms of shortness of breath, or wheezing Asthma control goals:  Full participation in all desired activities (may need albuterol before activity) Albuterol use two times or less a week on average (not counting use with activity) Cough interfering with sleep two times or less a month Oral steroids no more than once a year No hospitalizations   Food Allergy:  - please strictly avoid shellfish and fish. - SPT 01/2024: positive to shellfish and codfish.  - Initial rxn: hives with seafood ingestion, no interest in reintroduction  - for SKIN only reaction, okay to take Benadryl 25mg  capsules every 6 hours as needed - for SKIN + ANY additional symptoms, OR IF concern for LIFE THREATENING reaction = Epipen Autoinjector EpiPen 0.3 mg. - If using Epinephrine autoinjector, call 911 or go to the ER.    Oral Allergy Syndrome- Fresh Fruits (Citrus, Strawberry)  - These symptoms are typically not life-threatening and are because of a cross reaction between a pollen you are allergic to, and to a protein in specific foods (such as fresh fruits, vegetables, and nuts). - If you can eat these things and tolerate the symptoms, it is fine to continue to do so.  If not, you may avoid these fresh fruits and vegetables.   - Heating these foods, buying them canned, and peeling these foods should allow them to be consumed without symptoms or with less symptoms. - Patients typically report itching and/or mild  swelling of the mouth and throat immediately following ingestion of certain uncooked fruits (including nuts) or raw vegetables.    ALLERGEN AVOIDANCE MEASURES   Dust Mites Use central air conditioning and heat; and change the filter monthly.  Pleated filters work better than mesh filters.  Electrostatic filters may also be used; wash the filter monthly.  Window air conditioners may be used, but do not clean the air as well as a central air conditioner.  Change or wash the filter monthly. Keep windows closed.  Do not use attic fans.   Encase the mattress, box springs and pillows with zippered, dust proof covers. Wash the bed linens in hot water weekly.   Remove carpet, especially from the bedroom. Remove stuffed animals, throw pillows, dust ruffles, heavy drapes and other items that collect dust from the bedroom. Do not use a humidifier.   Use wood, vinyl or leather furniture instead of cloth furniture in the bedroom. Keep the indoor humidity at 30 - 40%.    Molds - Outdoor avoidance Avoid being outside when the grass is being mowed, or the ground is tilled. Avoid playing in leaves, pine straw, hay, etc.  Dead plant materials contain mold. Avoid going into barns or grain storage areas. Remove leaves, clippings and compost from around the home.  Cockroach Limit spread of food around the house; especially keep  food out of bedrooms. Keep food and garbage in closed containers with a tight lid.  Never leave food out in the kitchen.  Do not leave out pet food or dirty food bowls. Mop the kitchen floor and wash countertops at least once a week. Repair leaky pipes and faucets so there is no standing water to attract roaches. Plug up cracks in the house through which cockroaches can enter. Use bait stations and approved pesticides to reduce cockroach infestation. Pollen Avoidance Pollen levels are highest during the mid-day and afternoon.  Consider this when planning outdoor activities. Avoid  being outside when the grass is being mowed, or wear a mask if the pollen-allergic person must be the one to mow the grass. Keep the windows closed to keep pollen outside of the home. Use an air conditioner to filter the air. Take a shower, wash hair, and change clothing after working or playing outdoors during pollen season. Pet Dander- Cats Keep the pet out of your bedroom and restrict it to only a few rooms. Be advised that keeping the pet in only one room will not limit the allergens to that room. Don't pet, hug or kiss the pet; if you do, wash your hands with soap and water. High-efficiency particulate air (HEPA) cleaners run continuously in a bedroom or living room can reduce allergen levels over time. Regular use of a high-efficiency vacuum cleaner or a central vacuum can reduce allergen levels. Giving your pet a bath at least once a week can reduce airborne allergen.    Return in about 3 months (around 05/21/2024).  Alesia Morin, MD [facility]

## 2024-02-19 NOTE — Patient Instructions (Addendum)
 Allergic Rhinitis: - SPT 01/2024: positive to grasses, molds, cats, dust mites, cockroach  - Use nasal saline rinses before nose sprays such as with Neilmed Sinus Rinse.  Use distilled water.   - Use Flonase 2 sprays each nostril daily. Aim upward and outward. - Use Azelastine 2 sprays each nostril twice daily as needed for runny nose, drainage, sneezing, congestion. Aim upward and outward. - Use Zyrtec 10 mg daily.  - Use Singulair (Montelukast) 10mg  daily.  Stop if you have any mood changes with this.   - Consider allergy shots as long term control of your symptoms by teaching your immune system to be more tolerant of your allergy triggers  Urticaria (Hives): - At this time etiology of hives and swelling is unknown. Hives can be caused by a variety of different triggers including illness/infection, pressure, vibrations, extremes of temperature to name a few however majority of the time there is no identifiable trigger.  - Start Zyrtec 10mg  daily. - If hives recur, increase to Zyrtec 10mg  twice daily.  - If no improvement in 2-3 days, add Pepcid 20mg  twice daily and continue Zyrtec 10mg  twice daily.  Mild Intermittent Asthma: - Rescue inhaler: Albuterol 2 puffs every 4-6 hours as needed for respiratory symptoms of shortness of breath, or wheezing Asthma control goals:  Full participation in all desired activities (may need albuterol before activity) Albuterol use two times or less a week on average (not counting use with activity) Cough interfering with sleep two times or less a month Oral steroids no more than once a year No hospitalizations   Food Allergy:  - please strictly avoid shellfish and fish. - for SKIN only reaction, okay to take Benadryl 25mg  capsules every 6 hours as needed - for SKIN + ANY additional symptoms, OR IF concern for LIFE THREATENING reaction = Epipen Autoinjector EpiPen 0.3 mg. - If using Epinephrine autoinjector, call 911 or go to the ER.    Oral Allergy  Syndrome- Fresh Fruits like Citrus/Strawberry  - These symptoms are typically not life-threatening and are because of a cross reaction between a pollen you are allergic to, and to a protein in specific foods (such as fresh fruits, vegetables, and nuts). - If you can eat these things and tolerate the symptoms, it is fine to continue to do so.  If not, you may avoid these fresh fruits and vegetables.   - Heating these foods, buying them canned, and peeling these foods should allow them to be consumed without symptoms or with less symptoms. - Patients typically report itching and/or mild swelling of the mouth and throat immediately following ingestion of certain uncooked fruits (including nuts) or raw vegetables.    ALLERGEN AVOIDANCE MEASURES   Dust Mites Use central air conditioning and heat; and change the filter monthly.  Pleated filters work better than mesh filters.  Electrostatic filters may also be used; wash the filter monthly.  Window air conditioners may be used, but do not clean the air as well as a central air conditioner.  Change or wash the filter monthly. Keep windows closed.  Do not use attic fans.   Encase the mattress, box springs and pillows with zippered, dust proof covers. Wash the bed linens in hot water weekly.   Remove carpet, especially from the bedroom. Remove stuffed animals, throw pillows, dust ruffles, heavy drapes and other items that collect dust from the bedroom. Do not use a humidifier.   Use wood, vinyl or leather furniture instead of cloth furniture in the  bedroom. Keep the indoor humidity at 30 - 40%.    Molds - Outdoor avoidance Avoid being outside when the grass is being mowed, or the ground is tilled. Avoid playing in leaves, pine straw, hay, etc.  Dead plant materials contain mold. Avoid going into barns or grain storage areas. Remove leaves, clippings and compost from around the home.  Cockroach Limit spread of food around the house; especially keep  food out of bedrooms. Keep food and garbage in closed containers with a tight lid.  Never leave food out in the kitchen.  Do not leave out pet food or dirty food bowls. Mop the kitchen floor and wash countertops at least once a week. Repair leaky pipes and faucets so there is no standing water to attract roaches. Plug up cracks in the house through which cockroaches can enter. Use bait stations and approved pesticides to reduce cockroach infestation. Pollen Avoidance Pollen levels are highest during the mid-day and afternoon.  Consider this when planning outdoor activities. Avoid being outside when the grass is being mowed, or wear a mask if the pollen-allergic person must be the one to mow the grass. Keep the windows closed to keep pollen outside of the home. Use an air conditioner to filter the air. Take a shower, wash hair, and change clothing after working or playing outdoors during pollen season. Pet Dander- Cats Keep the pet out of your bedroom and restrict it to only a few rooms. Be advised that keeping the pet in only one room will not limit the allergens to that room. Don't pet, hug or kiss the pet; if you do, wash your hands with soap and water. High-efficiency particulate air (HEPA) cleaners run continuously in a bedroom or living room can reduce allergen levels over time. Regular use of a high-efficiency vacuum cleaner or a central vacuum can reduce allergen levels. Giving your pet a bath at least once a week can reduce airborne allergen.

## 2024-03-08 LAB — AMB RESULTS CONSOLE CBG: Glucose: 93

## 2024-03-08 NOTE — Progress Notes (Unsigned)
 Pt has insurance. Pt doesn't smoke. Pt does not have any sdoh.

## 2024-03-20 ENCOUNTER — Encounter: Payer: Self-pay | Admitting: Cardiology

## 2024-03-20 ENCOUNTER — Ambulatory Visit: Payer: Medicare HMO | Attending: Cardiology | Admitting: Cardiology

## 2024-03-20 VITALS — BP 146/80 | HR 60 | Ht 68.0 in | Wt 277.8 lb

## 2024-03-20 DIAGNOSIS — I10 Essential (primary) hypertension: Secondary | ICD-10-CM

## 2024-03-20 DIAGNOSIS — Z6841 Body Mass Index (BMI) 40.0 and over, adult: Secondary | ICD-10-CM

## 2024-03-20 DIAGNOSIS — Z79899 Other long term (current) drug therapy: Secondary | ICD-10-CM

## 2024-03-20 DIAGNOSIS — R601 Generalized edema: Secondary | ICD-10-CM

## 2024-03-20 DIAGNOSIS — R0609 Other forms of dyspnea: Secondary | ICD-10-CM | POA: Diagnosis not present

## 2024-03-20 DIAGNOSIS — I517 Cardiomegaly: Secondary | ICD-10-CM

## 2024-03-20 MED ORDER — IRBESARTAN 150 MG PO TABS: 150.0000 mg | ORAL_TABLET | Freq: Every day | ORAL | 3 refills | Status: DC

## 2024-03-20 NOTE — Assessment & Plan Note (Signed)
 High blood pressure and deconditioning.  No signs or symptoms to suggest ischemia.

## 2024-03-20 NOTE — Assessment & Plan Note (Signed)
 LVH noted on EKG but not so much on echocardiogram.  Continue to treat BP.

## 2024-03-20 NOTE — Assessment & Plan Note (Signed)
 Allergic reactions to medications with dyes, including lisinopril and HCTZ. Valsartan  not tolerated due to color. Irbesartan  chosen for its white color to avoid allergies.

## 2024-03-20 NOTE — Assessment & Plan Note (Addendum)
 The patient understands the need to lose weight with diet and exercise. We have discussed specific strategies for this. Symptoms suggestive of sleep apnea.  STOP-BANG score is 6,.  Will plan to discuss possible OSA assessment and follow-up

## 2024-03-20 NOTE — Progress Notes (Signed)
 Cardiology Office Note:  .   Date:  03/20/2024  ID:  Heather Mcintyre, DOB 04/21/1966, MRN [id number] PCP: Lorina Roosevelt, MD  [facility] Providers Cardiologist:  Randene Bustard, MD     No chief complaint on file.   Patient Profile: .     Heather Mcintyre is a morbidly obese 58 y.o. female  with a PMH reviewed below who presents here for 3 month f/u.  She was initially Referred at the request of Aycock, Ngwe A, MD.  PMH notable for  Poorly controlled HTN,   Did not take valsartan -HCT more than a couple days because of "allergies ".  Was initially thought to be valsartan  hand although she was tried on HCTZ alone and apparently also had allergy . 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  She had just darted Byetta along with the valsartan -HCTZ and noted itching.  Her BP was somewhat elevated in the 160s over 90s which is consistent with what she had at home.  Her echocardiogram results reviewed as a review renal ultrasound results. => She changed metoprolol  to carvedilol , and had stopped Diovan -HCTZ and started HCTZ alone.  Referred to allergist. She was then  seen on February 12, 2024 by Morey Ar, NP, NP for close follow-up of BP assessment.  At that time she noted that she was unable to take HCTZ.  Interestingly, her BP would look better.  She was concerned about the combination of colchicine  and carvedilol  for her gout.  Subjective  Discussed the use of AI scribe software for clinical note transcription with the patient, who gave verbal consent to proceed.  History of Present Illness History of Present Illness Heather Mcintyre is a 58 year old female with hypertension who presents for a follow-up visit.  Her blood pressure was high today,  although it was better controlled during her last visit three months ago. At home, her blood pressure readings often range from 180/104 to 190/106, occasionally dropping to 150/80. 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  She has a history of allergic reactions to medications containing dyes, including HCTZ and lisinopril, which caused severe allergic reactions requiring prednisone  treatment and the use of an EpiPen . She is currently taking carvedilol , which helps manage her blood pressure, but she cannot increase the dose due to concerns about her heart rate. She uses Lasix as needed but experiences cramps in her thighs when taking it.  No headaches, blurred vision, orthopnea, or palpitations. However, she reports persistent minor swelling in her legs. She has been told she has sleep apnea and uses multiple pillows to sleep upright due to waking up short of breath, but she mostly attributes this to her allergies.  She frequently touches her nose and feels the need to clear it, despite no apparent congestion. She denies any chest pain or pressure rest exertion.  No exacerbation of exertional dyspnea.  No rapid irregular beats palpitations.  No syncope or near syncope.  No TIA or emesis BX.  No claudication.  Previous diagnostic studies include an echocardiogram, which showed normal pump function with an ejection fraction of 55-60%, no wall motion abnormalities, and  normal valve function. Kidney RA dopplers were also performed, showing results at the high end of normal but not contributing to her hypertension.  ROS:  Review of Systems - Negative except symptoms noted above    Objective   Current Medications: Carvedilol  12.5 mg twice daily, as needed Lasix 40 mg daily not using frequently. Byetta 5 mcg twice daily Ventolin inhaler 2 puffs every 6 hours as needed Arimidex  1 mg daily Linzess  200 mcg  daily, Prilosec 40 mg PRN Meds for allergies: Benadryl, Zyrtec , Atarax  and EpiPen  as well as Flonase  and Singulair   Studies Reviewed: Aaron Aas        ECHO 12/2023: 1. Left ventricular ejection fraction, by estimation, is 55 to 60%. The left ventricle has normal function. The left ventricle has no regional wall motion abnormalities. Left ventricular diastolic parameters were normal.  2. Right ventricular systolic function is normal. The right ventricular size is normal.  3. The mitral valve is normal in structure. Mild mitral valve regurgitation.  4. The aortic valve is tricuspid. Aortic valve regurgitation is not visualized.  5. The inferior vena cava is normal in size with greater than 50% respiratory variability, suggesting right atrial pressure of 3 mmHg.   Risk Assessment/Calculations:     HYPERTENSION CONTROL Vitals:   03/20/24 0927 03/20/24 1933  BP: (!) 150/82 (!) 146/80    The patient's blood pressure is elevated above target today.  In order to address the patient's elevated BP: Blood pressure will be monitored at home to determine if medication changes need to be made.; A new medication was prescribed today.; A referral to the [facility] will be placed. (Start irbesartan  150 mg daily; take one half tab Lasix (20 mg) daily)      STOP-Bang Score:  6        Physical Exam:   VS:  BP (!) 146/80   Pulse 60   Ht 5\' 8"  (1.727 m)   Wt 277 lb 12.8 oz (126 kg)   SpO2 98%   BMI 42.24 kg/m    Wt Readings from Last 3 Encounters:  03/20/24 277 lb 12.8 oz (126 kg)  02/12/24 276 lb 12.8 oz (125.6 kg)  02/05/24 274 lb 8 oz (124.5 kg)    GEN: Well nourished, well groomed in no acute distress; morbidly obese NECK: No JVD; No carotid bruits CARDIAC: RRR; distant-normal S1, S2; no murmurs, rubs, gallops RESPIRATORY:  Clear to auscultation without rales, wheezing or rhonchi ; nonlabored, good air movement. ABDOMEN: Soft, non-tender, non-distended EXTREMITIES:  No edema; No  deformity      ASSESSMENT AND PLAN: .    Problem List Items Addressed This Visit       Cardiology Problems   LVH (left ventricular hypertrophy) (Chronic)   LVH noted on EKG but not so much on echocardiogram.  Continue to treat BP.      Relevant Medications   irbesartan  (AVAPRO ) 150 MG tablet   Severe Hypertension   Relevant Medications   irbesartan  (AVAPRO ) 150 MG tablet   Other Relevant Orders   AMB REFERRAL TO ADVANCED HTN CLINIC   Uncontrolled hypertension - Primary (Chronic)   Hypertension remains uncontrolled. Current regimen with carvedilol  is limited by potential bradycardia. Previous medications caused allergic reactions.  Irbesartan  considered due to its white color to avoid dye-related allergies. - Start irbesartan  150 mg daily. - Take Lasix 20 mg daily -Continue carvedilol  12.5 mg twice daily-unable to titrate further because of bradycardia. - Refer to pharmacist-run blood pressure clinic in [place]  for medication management. - Order CMP and lipid panel today. - Schedule follow-up with blood pressure clinic in 4-6 weeks.      Relevant Medications   irbesartan  (AVAPRO ) 150 MG tablet   Other Relevant Orders   AMB REFERRAL TO ADVANCED HTN CLINIC     Other   DOE (dyspnea on exertion)   High blood pressure and deconditioning.  No signs or symptoms to suggest ischemia.      Relevant Orders   Comp Met (CMET)   Lipid panel   AMB REFERRAL TO ADVANCED HTN CLINIC   Generalized edema (Chronic)   Chronic peripheral edema likely related to hypertension and medication side effects. Lasix causes muscle cramps. - Take Lasix 20 mg (half tablet) daily.      Medication management   Allergic reactions to medications with dyes, including lisinopril and HCTZ. Valsartan  not tolerated due to color. Irbesartan  chosen for its white color to avoid allergies.      Relevant Orders   Comp Met (CMET)   Lipid panel   AMB REFERRAL TO ADVANCED HTN CLINIC   Morbid obesity with  BMI of 40.0-44.9, adult (HCC) (Chronic)   The patient understands the need to lose weight with diet and exercise. We have discussed specific strategies for this. Symptoms suggestive of sleep apnea.  STOP-BANG score is 6,.  Will plan to discuss possible OSA assessment and follow-up          Allergic reaction to medication   Follow-Up: Return in about 4 weeks (around 04/17/2024) for BP follow-up with CVRR, 3-4 month follow-up, Routine follow up with me, [place] office.  Recording duration: 22 minutes  I spent 42 minutes in the care of DELLIA DONNELLY today including reviewing labs (2 minutes), reviewing studies (4 minutes reviewing echo and renal artery Doppler results), face to face time discussing treatment options (22), reviewing records from previous notes, ER visits and APP notes (6 minutes), 8 minutes dictating, and documenting in the encounter.     Signed, Arleen Lacer, MD, MS Randene Bustard, M.D., M.S. Interventional Cardiologist  [facility]  Pager # [phone] Phone # [phone] [address] [place], [place] [address]

## 2024-03-20 NOTE — Assessment & Plan Note (Signed)
 Chronic peripheral edema likely related to hypertension and medication side effects. Lasix causes muscle cramps. - Take Lasix 20 mg (half tablet) daily.

## 2024-03-20 NOTE — Patient Instructions (Addendum)
 Medication Instructions:  Your physician has recommended you make the following change in your medication:   START irbesartan  150 mg   *If you need a refill on your cardiac medications before your next appointment, please call your pharmacy*  Lab Work: CMP & Lipid today in office  If you have labs (blood work) drawn today and your tests are completely normal, you will receive your results only by: MyChart Message (if you have MyChart) OR A paper copy in the mail If you have any lab test that is abnormal or we need to change your treatment, we will call you to review the results.  Testing/Procedures: None  Follow-Up: At [facility], you and your health needs are our priority.  As part of our continuing mission to provide you with exceptional heart care, our providers are all part of one team.  This team includes your primary Cardiologist (physician) and Advanced Practice Providers or APPs (Physician Assistants and Nurse Practitioners) who all work together to provide you with the care you need, when you need it.  Your next appointment:   4 month(s)  Provider:   Randene Bustard, MD    Other Instructions Dr. Addie Holstein would like for you to see our BP clinic in [place] within 4-6 weeks.

## 2024-03-20 NOTE — Assessment & Plan Note (Signed)
 Hypertension remains uncontrolled. Current regimen with carvedilol  is limited by potential bradycardia. Previous medications caused allergic reactions.  Irbesartan  considered due to its white color to avoid dye-related allergies. - Start irbesartan  150 mg daily. - Take Lasix 20 mg daily -Continue carvedilol  12.5 mg twice daily-unable to titrate further because of bradycardia. - Refer to pharmacist-run blood pressure clinic in [place] for medication management. - Order CMP and lipid panel today. - Schedule follow-up with blood pressure clinic in 4-6 weeks.

## 2024-03-21 LAB — COMPREHENSIVE METABOLIC PANEL WITH GFR
ALT: 15 IU/L (ref 0–32)
AST: 21 IU/L (ref 0–40)
Albumin: 3.9 g/dL (ref 3.8–4.9)
Alkaline Phosphatase: 83 IU/L (ref 44–121)
BUN/Creatinine Ratio: 23 (ref 9–23)
BUN: 19 mg/dL (ref 6–24)
Bilirubin Total: 0.4 mg/dL (ref 0.0–1.2)
CO2: 25 mmol/L (ref 20–29)
Calcium: 9.7 mg/dL (ref 8.7–10.2)
Chloride: 105 mmol/L (ref 96–106)
Creatinine, Ser: 0.81 mg/dL (ref 0.57–1.00)
Globulin, Total: 3.2 g/dL (ref 1.5–4.5)
Glucose: 80 mg/dL (ref 70–99)
Potassium: 4.3 mmol/L (ref 3.5–5.2)
Sodium: 142 mmol/L (ref 134–144)
Total Protein: 7.1 g/dL (ref 6.0–8.5)
eGFR: 84 mL/min/{1.73_m2} (ref >59)

## 2024-03-21 LAB — LIPID PANEL
Chol/HDL Ratio: 3.8 ratio (ref 0.0–4.4)
Cholesterol, Total: 177 mg/dL (ref 100–199)
HDL: 47 mg/dL (ref >39)
LDL Chol Calc (NIH): 112 mg/dL — ABNORMAL HIGH (ref 0–99)
Triglycerides: 98 mg/dL (ref 0–149)
VLDL Cholesterol Cal: 18 mg/dL (ref 5–40)

## 2024-03-26 ENCOUNTER — Other Ambulatory Visit: Payer: Self-pay | Admitting: Physician Assistant

## 2024-03-26 DIAGNOSIS — M25561 Pain in right knee: Secondary | ICD-10-CM

## 2024-03-27 ENCOUNTER — Ambulatory Visit
Admission: RE | Admit: 2024-03-27 | Discharge: 2024-03-27 | Disposition: A | Discharge status: Home / Self Care | Source: Ambulatory Visit | Attending: Physician Assistant | Admitting: Physician Assistant

## 2024-03-27 DIAGNOSIS — M25561 Pain in right knee: Secondary | ICD-10-CM | POA: Insufficient documentation

## 2024-04-03 ENCOUNTER — Encounter: Payer: Self-pay | Admitting: Cardiology

## 2024-04-04 ENCOUNTER — Other Ambulatory Visit: Payer: Self-pay | Admitting: *Deleted

## 2024-04-04 MED ORDER — ROSUVASTATIN CALCIUM 20 MG PO TABS: 20.0000 mg | ORAL_TABLET | Freq: Every day | ORAL | 3 refills | Status: DC

## 2024-04-04 NOTE — Progress Notes (Signed)
 Called and left message with Dr. Addie Holstein recommendations. Prescription for Rosuvastatin 20 mg sent to patient pharmacy. Left message to call office for any questions.

## 2024-04-14 ENCOUNTER — Encounter: Payer: Self-pay | Admitting: Surgery

## 2024-04-14 ENCOUNTER — Ambulatory Visit: Admitting: Surgery

## 2024-04-14 VITALS — BP 175/100 | HR 67 | Temp 98.5°F | Ht 68.0 in | Wt 273.2 lb

## 2024-04-14 DIAGNOSIS — M25512 Pain in left shoulder: Secondary | ICD-10-CM | POA: Diagnosis not present

## 2024-04-14 DIAGNOSIS — Z170 Estrogen receptor positive status [ER+]: Secondary | ICD-10-CM | POA: Diagnosis not present

## 2024-04-14 DIAGNOSIS — Z09 Encounter for follow-up examination after completed treatment for conditions other than malignant neoplasm: Secondary | ICD-10-CM

## 2024-04-14 DIAGNOSIS — C50912 Malignant neoplasm of unspecified site of left female breast: Secondary | ICD-10-CM

## 2024-04-14 NOTE — Patient Instructions (Signed)
 Exercises Following Breast Surgery Ask your health care provider which exercises are safe for you. Do exercises exactly as told by your health care provider and adjust them as directed. It is normal to feel mild stretching, pulling, tightness, or discomfort as you do these exercises. Stop right away if you feel sudden pain or your pain gets worse. Do not begin these exercises until told by your health care provider. Exercises Deep breathing  Lie down on your back. You may bend your knees for comfort. Slowly breathe in as much air as you can and expand your chest and abdomen. Think about pushing your belly button away from your spine while you do this. It may help to put your hands on your belly so you can feel it expand. Slowly breathe out. Repeat these steps 4-5 times or the number of times that is comfortable for you. Wand exercise  Lie down on your back. You may bend your knees for comfort. Position your hands so your thumbs are pointing toward each other. With both hands, hold a wand-shaped object, such as a broom handle or a cane. Your hands should be about shoulder-width apart on the object. Start with the object resting across your hips. Slowly lift the wand toward the ceiling. Use your unaffected arm to help lift the wand. If it is comfortable for you, lengthen the movement to go from your hips to over your head. Repeat these steps 5-7 times. Elbow winging  Lie down on your back. You may bend your knees for comfort. Clasp your hands together behind your head so that your elbows point toward the ceiling. Keep your hands together and move your elbows apart and down toward the floor as far as you comfortably can. Repeat these steps 5-7 times. Swelling reduction, lying down  Lie down on your back. You may bend your knees for comfort. Raise (elevate) your affected arm above the level of your heart and keep it elevated during the exercise. Open and close your hand on the affected side  15-25 times in a row. Bend and straighten your elbow 15-25 times in a row. You may keep your arm elevated for up to 45 minutes at a time. This helps to reduce swelling. You may rest your arm on top of pillows to make this easier. Do this exercise 2-4 times a day. Swelling reduction, sitting  Sit in a chair. You may rest your arm on a pillow. Hold a tennis ball, a rolled-up towel, or a similar object in your hand on your affected side. Slowly squeeze the ball or towel. Try to do this using only your hand muscles. Relax your hand. Repeat these steps 15-25 times. Shoulder blade stretch  Sit in a chair, facing a table. Your back should be against the back of the chair. Place your unaffected arm on the table with your palm down and your elbow bent. This arm will support you and will not move during the exercise. Place your affected arm on the table with your palm down and your elbow straight. Slide your affected arm forward, toward the opposite side of the table, but avoid leaning forward. While you do this, you should feel the shoulder blade on your affected side move away from the back of the chair. You may put a towel under your hand to help it slide more easily. Repeat these steps 5-7 times. Shoulder blade squeeze  Sit in a stable chair with good posture. Avoid letting your back touch the back of the  chair. Your arms should be at your sides with your elbows bent. You may rest your forearms on a pillow. Squeeze your shoulder blades together. Think about trying to bring them down and back. Keep your shoulders level. Do not lift your shoulders up toward your ears. Relax your muscles completely before you repeat this exercise. Repeat these steps 5-7 times. Side bend  Sit in a stable chair with your feet flat on the floor. You may put your feet shoulder-width apart to help you feel more stable. Clasp your hands together in your lap and lift your hands slowly over your head until your arms  are straight. You may use your unaffected arm to help lift your other arm. With your arms above your head, bend at your waist to your right so you feel a stretch in your left side. Straighten your abdomen and move your arms back to the center, above your head. Repeat step 3 on the other side of your body by bending to the left until you feel a stretch in your right side. Repeat these steps 5-7 times. Chest stretch  Stand in a doorway with one of your feet in front of the other. It does not matter which foot is forward. If you cannot reach your forearms to the door frame, do this exercise in a corner of a room. Bend your elbows and place your forearms on the door frame or on each side of the corner. Your elbows should be as close to shoulder height as possible. Slowly move your weight onto your front foot until you feel a stretch across your chest and in the front of your shoulders. Do not let your shoulders move up toward your ears. Repeat these steps 5-7 times. Wall climb, flexion  Stand facing a wall with your toes about 8-10 inches (20-25 cm) from the wall. Place your hands on the wall, about shoulder-width apart. Move your hands up the wall and stretch toward the ceiling (flexion). Do not let your shoulders shrug. Do not arch your back. Repeat these steps 5-7 times. Wall climb, abduction  Stand about 18 inches (45.7 cm) from a wall, with your affected side facing the wall. Bend the elbow of your arm on your affected side, and place your hand on the wall. Move your hand up the wall and toward the ceiling (abduction). Do not let your shoulders shrug. Repeat these steps 5-7 times. Contact a health care provider if you: Have trouble doing any of the exercises. Have pain or swelling that gets worse. Develop: New pain or swelling. A feeling of heaviness in your arm. Numbness or tingling in your arms or chest. This information is not intended to replace advice given to you by your health  care provider. Make sure you discuss any questions you have with your health care provider. Document Revised: 11/01/2021 Document Reviewed: 11/01/2021 Elsevier Patient Education  2024 [employer].

## 2024-04-14 NOTE — Progress Notes (Signed)
 04/14/2024  History of Present Illness: Heather Mcintyre is a 58 y.o. female status post left breast lumpectomy and sentinel lymph node biopsy on 11/07/2022 for invasive breast cancer.  She completed radiation in February 2024 and is currently on anastrozole .  The patient had a left breast abscess at the site of lumpectomy on 04/11/2023 and was taken to the operating room on the same day for incision and drainage.  Her wound fully healed by August 2024.  She had her last mammogram on 09/17/2023 which only showed postoperative changes with no suspicious findings.  She presents today for 66-month follow-up.  From the breast standpoint, the patient reports that she has been doing well and denies any pain in the left breast.  However she reports that her last 2 months she has noticed issues with her left arm particularly numbness and pain going from the shoulder down to the arm.  Particularly her range of motion has become more limited in the left shoulder.  She also reports that she sleeps on her left side, her left arm will go numb.  This is not something that predates surgery and did not happen right after surgery but only started 2 months ago.  Her left arm has not become more swollen or more tender from that standpoint.  Past Medical History: Past Medical History:  Diagnosis Date   Anxiety    Asthma    Breast cancer (HCC)    Cervical cancer (HCC)    Complication of anesthesia    woke up in the middle of surgery 2010   Degenerative joint disease/osteoarthritis    Depression    GERD (gastroesophageal reflux disease)    Hypertension    Lupus    Pre-diabetes      Past Surgical History: Past Surgical History:  Procedure Laterality Date   ABDOMINAL HYSTERECTOMY     BREAST BIOPSY Left 10/04/2022   US  LT BREAST BX W LOC DEV 1ST LESION IMG BX SPEC US  GUIDE 10/04/2022 [facility]   BREAST LUMPECTOMY,RADIO FREQ LOCALIZER,AXILLARY SENTINEL LYMPH NODE BIOPSY Left 11/07/2022   Procedure: BREAST  LUMPECTOMY,RADIO FREQ LOCALIZER,AXILLARY SENTINEL LYMPH NODE BIOPSY;  Surgeon: Emmalene Hare, MD;  Location: [facility];  Service: General;  Laterality: Left;   CHOLECYSTECTOMY N/A    COLONOSCOPY WITH ESOPHAGOGASTRODUODENOSCOPY (EGD)     INCISION AND DRAINAGE ABSCESS Left 04/11/2023   Procedure: INCISION AND DRAINAGE ABSCESS, breast;  Surgeon: Emmalene Hare, MD;  Location: [facility];  Service: General;  Laterality: Left;   JOINT REPLACEMENT     LAPAROSCOPIC GASTRIC BAND REMOVAL WITH LAPAROSCOPIC GASTRIC SLEEVE RESECTION     REPLACEMENT TOTAL KNEE BILATERAL     TUBAL LIGATION      Home Medications: Prior to Admission medications   Medication Sig Start Date End Date Taking? Authorizing Provider  acetaminophen  (TYLENOL ) 500 MG tablet Take 2 tablets (1,000 mg total) by mouth every 6 (six) hours as needed for mild pain. 11/07/22  Yes Juri Dinning, MD  ALOE VERA PO Take 1 tablet by mouth daily. Natural aloe   Yes [provider]  anastrozole  (ARIMIDEX ) 1 MG tablet Take 1 tablet (1 mg total) by mouth daily. 02/13/24  Yes Shellie Dials, MD  ascorbic acid  (VITAMIN C ) 500 MG tablet Take 500 mg by mouth daily.   Yes [provider]  azelastine  (ASTELIN ) 0.1 % nasal spray Place 2 sprays into both nostrils 2 (two) times daily as needed for rhinitis or allergies. 02/19/24  Yes Kandice Orleans, MD  B COMPLEX VITAMINS  PO Take 1 tablet by mouth daily.   Yes [provider]  BYETTA 5 MCG PEN 5 MCG/0.02ML SOPN injection Inject 5 mcg into the skin 2 (two) times daily with a meal. 12/06/23  Yes [provider]  carvedilol  (COREG ) 12.5 MG tablet Take 1 tablet (12.5 mg total) by mouth 2 (two) times daily. 01/22/24 04/21/24 Yes Wittenborn, Bernardo Bridgeman, NP  celecoxib  (CELEBREX ) 200 MG capsule Take 1 capsule (200 mg total) by mouth 2 (two) times daily as needed for moderate pain. 11/07/22  Yes Julianah Marciel, Volanda Gruber, MD  cetirizine  (ZYRTEC ) 10 MG tablet Take 1 tablet (10 mg total) by mouth 2 (two)  times daily as needed for allergies (or hives). 02/19/24  Yes Kandice Orleans, MD  diphenhydrAMINE (BENADRYL) 25 mg capsule Take 25 mg by mouth every 6 (six) hours as needed.   Yes [provider]  EPINEPHrine  0.3 mg/0.3 mL IJ SOAJ injection Inject 0.3 mg into the muscle. 01/29/24  Yes [provider]  famotidine  (PEPCID ) 20 MG tablet Take 1 tablet (20 mg total) by mouth 2 (two) times daily. 01/16/24  Yes Norlene Beavers, MD  fluticasone  (FLONASE ) 50 MCG/ACT nasal spray Place 2 sprays into both nostrils daily. 02/19/24  Yes Kandice Orleans, MD  furosemide (LASIX) 40 MG tablet Take 40 mg by mouth daily as needed.   Yes [provider]  gabapentin  (NEURONTIN ) 300 MG capsule Take 300 mg by mouth at bedtime. 06/28/16  Yes [provider]  hydrOXYzine  (ATARAX ) 10 MG tablet Take 10 mg by mouth 2 (two) times daily. 09/26/22  Yes [provider]  irbesartan  (AVAPRO ) 150 MG tablet Take 1 tablet (150 mg total) by mouth daily. 03/20/24  Yes Arleen Lacer, MD  linaclotide  (LINZESS ) 290 MCG CAPS capsule Take 290 mcg by mouth daily before breakfast.   Yes [provider]  montelukast  (SINGULAIR ) 10 MG tablet Take 1 tablet (10 mg total) by mouth at bedtime. 02/19/24  Yes Kandice Orleans, MD  multivitamin-lutein [facility]) CAPS capsule Take 1 capsule by mouth as needed.   Yes [provider]  omeprazole (PRILOSEC) 40 MG capsule PRN 09/13/17  Yes [provider]  rosuvastatin  (CRESTOR ) 20 MG tablet Take 1 tablet (20 mg total) by mouth daily. 04/04/24 07/03/24 Yes Arleen Lacer, MD  tiZANidine  (ZANAFLEX ) 4 MG tablet Take 4 mg by mouth every 8 (eight) hours as needed for muscle spasms.   Yes [provider]  VENTOLIN HFA 108 (90 Base) MCG/ACT inhaler Inhale 2 puffs into the lungs every 6 (six) hours as needed. 01/29/24  Yes [provider]  metoprolol  succinate (TOPROL -XL) 50 MG 24 hr tablet Take 50 mg by mouth daily. Take with or  immediately following a meal. Patient not taking: No sig reported    [provider]    Allergies: Allergies  Allergen Reactions   Cephalexin Hives and Other (See Comments)   Etodolac Hives   Iodinated Contrast Media Hives   Latex Hives and Other (See Comments)   Penicillin G Hives   Fish-Derived Products Other (See Comments)   Hydrochlorothiazide  Itching   Iodine Hives   Penicillins    Shellfish Allergy  Hives   Lisinopril Other (See Comments)    Review of Systems: Review of Systems  Constitutional:  Negative for chills and fever.  Respiratory:  Negative for shortness of breath.   Cardiovascular:  Negative for chest pain.  Gastrointestinal:  Negative for nausea and vomiting.  Musculoskeletal:  Left shoulder pain with numbness left arm  Skin:        No left breast pain or tenderness or swelling or redness.    Physical Exam BP (!) 175/100   Pulse 67   Temp 98.5 F (36.9 C) (Oral)   Ht 5\' 8"  (1.727 m)   Wt 273 lb 3.2 oz (123.9 kg)   SpO2 96%   BMI 41.54 kg/m  CONSTITUTIONAL: No acute distress HEENT:  Normocephalic, atraumatic, extraocular motion intact. RESPIRATORY:  Normal respiratory effort without pathologic use of accessory muscles. CARDIOVASCULAR: Regular rhythm and rate. BREAST: Left breast status post lumpectomy followed by I&D in the left upper outer quadrant.  Incision is well-healed.  No palpable masses, annular skin changes, or nipple changes.  No left axillary lymphadenopathy.  Right breast without any palpable masses, nipple changes, or skin changes.  No right axillary lymphadenopathy. MUSCULOSKELETAL: Left arm range of motion is limited with tenderness with abduction more than 90 degrees.  However flexion and extension did not have significant issues.  She does have more tenderness in the left shoulder when flexing her elbow joint but not when extending her elbow point.  Left arm circumference is similar to right arm. NEUROLOGIC:  Motor and  sensation is grossly normal.  Cranial nerves are grossly intact. PSYCH:  Alert and oriented to person, place and time. Affect is normal.  Assessment and Plan: This is a 58 y.o. female status post left breast lumpectomy and sentinel lymph node biopsy followed by I&D of left breast abscess.  - For the breast standpoint, the patient is doing very well and there is no suspicious findings on exam or on prior imaging last year.  She will be due for repeat mammogram in October 2025.  She will follow-up with me in November for repeat exam. - With regards to the left shoulder, the patient may be having issues with the rotator cuff or potentially arthritis.  I do not think this is in relationship to her left breast surgery or sentinel lymph node biopsy.  I do not think this in relationship to lymphedema as arm circumference is the same.  I did recommend that she contact her PCP as she may need at least an x-ray of her left shoulder possibly an MRI depending on what the findings are.  She could benefit from physical therapy. - Follow-up with me in 6 months.  I spent 20 minutes dedicated to the care of this patient on the date of this encounter to include pre-visit review of records, face-to-face time with the patient discussing diagnosis and management, and any post-visit coordination of care.   Marene Shape, MD [facility]

## 2024-05-01 ENCOUNTER — Encounter (HOSPITAL_BASED_OUTPATIENT_CLINIC_OR_DEPARTMENT_OTHER): Payer: Self-pay | Admitting: Family

## 2024-05-01 ENCOUNTER — Ambulatory Visit (INDEPENDENT_AMBULATORY_CARE_PROVIDER_SITE_OTHER): Admitting: Family

## 2024-05-01 VITALS — BP 160/80 | HR 76 | Ht 68.0 in | Wt 275.2 lb

## 2024-05-01 DIAGNOSIS — G4733 Obstructive sleep apnea (adult) (pediatric): Secondary | ICD-10-CM

## 2024-05-01 DIAGNOSIS — I10 Essential (primary) hypertension: Secondary | ICD-10-CM | POA: Diagnosis not present

## 2024-05-01 MED ORDER — IRBESARTAN 300 MG PO TABS: 300.0000 mg | ORAL_TABLET | Freq: Every day | ORAL | 1 refills | Status: AC

## 2024-05-01 NOTE — Patient Instructions (Signed)
 Medication Instructions:  Your physician has recommended you make the following change in your medication:   Increase irbesartan  300 daily    Labwork: Labs in 7-10 days- BMP, Renin-Aldosterone, Catecholamines, & metanephrines    Follow-Up: Please follow up in 4 months in [facility] with Dr. Theodis Fiscal, Neomi Banks, NP or Donivan Furry PharmD    Special Instructions:

## 2024-05-01 NOTE — Progress Notes (Signed)
 [facility] Initial Assessment:    Date:  05/01/2024   ID:  Heather Mcintyre, DOB 1966/07/12, MRN [id number]  PCP:  Lorina Roosevelt, MD  Cardiologist:  Randene Bustard, MD  Nephrologist:  Referring MD: Arleen Lacer, MD   CC: Hypertension  History of Present Illness:    Heather Mcintyre is a 58 y.o. female with a hx of hypertension, LVH, LE edema, obesity, bradycardia, (cancer s/p lumpectomy and sentinel node biopsy 12//23 here to establish care in the [facility].   Referred by Dr. Addie Holstein for uncontrolled hypertension. Prior 12/2023 renal artery duplex with poor image quality. Reviewed by Dr. Alvenia Aus who recommended CT for further evaluation which she deferred. Previously intolerance to multiple antihypertensives predominantly related to dye. She follows with Dr. Lydia Sams [facility]. She is on CPAP per pulmonology, Dr. Kieran Pellet, but notes the nasal pillow mask is irritating skin around her nose and she is concerned she is allergic.   Heather Mcintyre was diagnosed with hypertension many years ago, cannot recall specific date. It has been difficult to control. Blood pressure checked with arm cuff at home. Readings have been 180-200/100s when checked pre or post medications. She does feel poorly with high BP. She uses her PRN Lasix for LE edema once or twice per week. She does note prior hypokalemia, no known prior testing for hyperaldosteronism. Family history of hypertension in both parents. she reports tobacco use never. 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  she eats at home and does follow low sodium diet. She drinks one cup of coffee and then water  throughout the day.    Previous antihypertensives: Metoprolol  - changed to carvedilol  Valsartan  - trouble with dye Lisinopril - cough Hydrochlorothiazide  - itching  Amlodipine   - poor reaction Losartan - poor reaction  Secondary Causes of Hypertension  Medications/Herbal: OCP, steroids,  stimulants, antidepressants, weight loss medication, immune suppressants, NSAIDs, sympathomimetics, alcohol, caffeine, licorice, ginseng, St. John's wort, chemo  Sleep Apnea Renal artery stenosis Hyperaldosteronism Hyper/hypothyroidism Pheochromocytoma: palpitations, tachycardia, headache, diaphoresis (plasma metanephrines) Cushing's syndrome: Cushingoid facies, central obesity, proximal muscle weakness, and ecchymoses, adrenal incidentaloma (cortisol) Coarctation of the aorta  Past Medical History:  Diagnosis Date   Anxiety    Asthma    Breast cancer (HCC)    Cervical cancer (HCC)    Complication of anesthesia    woke up in the middle of surgery 2010   Degenerative joint disease/osteoarthritis    Depression    GERD (gastroesophageal reflux disease)    Hypertension    Lupus    Pre-diabetes     Past Surgical History:  Procedure Laterality Date   ABDOMINAL HYSTERECTOMY     BREAST BIOPSY Left 10/04/2022   US  LT BREAST BX W LOC DEV 1ST LESION IMG BX SPEC US  GUIDE 10/04/2022 [facility]   BREAST LUMPECTOMY,RADIO FREQ LOCALIZER,AXILLARY SENTINEL LYMPH NODE BIOPSY Left 11/07/2022   Procedure: BREAST LUMPECTOMY,RADIO FREQ LOCALIZER,AXILLARY SENTINEL LYMPH NODE BIOPSY;  Surgeon: Emmalene Hare, MD;  Location: [facility];  Service: General;  Laterality: Left;   CHOLECYSTECTOMY N/A    COLONOSCOPY WITH ESOPHAGOGASTRODUODENOSCOPY (EGD)     INCISION AND DRAINAGE ABSCESS Left 04/11/2023   Procedure: INCISION AND DRAINAGE ABSCESS, breast;  Surgeon: Emmalene Hare, MD;  Location: [facility];  Service: General;  Laterality: Left;   JOINT REPLACEMENT     LAPAROSCOPIC GASTRIC BAND REMOVAL WITH LAPAROSCOPIC GASTRIC SLEEVE RESECTION     REPLACEMENT TOTAL KNEE BILATERAL     TUBAL LIGATION  Current Medications: Current Meds  Medication Sig   acetaminophen  (TYLENOL ) 500 MG tablet Take 2 tablets (1,000 mg total) by mouth every 6 (six) hours as needed for mild pain. (Patient taking differently:  Take 500 mg by mouth every 6 (six) hours as needed for mild pain (pain score 1-3).)   anastrozole  (ARIMIDEX ) 1 MG tablet Take 1 tablet (1 mg total) by mouth daily.   ascorbic acid  (VITAMIN C ) 500 MG tablet Take 500 mg by mouth daily.   azelastine  (ASTELIN ) 0.1 % nasal spray Place 2 sprays into both nostrils 2 (two) times daily as needed for rhinitis or allergies.   BYETTA 5 MCG PEN 5 MCG/0.02ML SOPN injection Inject 5 mcg into the skin 2 (two) times daily with a meal.   carvedilol  (COREG ) 12.5 MG tablet Take 1 tablet (12.5 mg total) by mouth 2 (two) times daily.   celecoxib  (CELEBREX ) 200 MG capsule Take 1 capsule (200 mg total) by mouth 2 (two) times daily as needed for moderate pain.   cetirizine  (ZYRTEC ) 10 MG tablet Take 1 tablet (10 mg total) by mouth 2 (two) times daily as needed for allergies (or hives).   diphenhydrAMINE (BENADRYL) 25 mg capsule Take 25 mg by mouth every 6 (six) hours as needed.   fluticasone  (FLONASE ) 50 MCG/ACT nasal spray Place 2 sprays into both nostrils daily.   furosemide (LASIX) 40 MG tablet Take 40 mg by mouth daily as needed.   gabapentin  (NEURONTIN ) 600 MG tablet Take 600 mg by mouth 3 (three) times daily.   GLOBAL EASE INJECT PEN NEEDLES 32G X 4 MM MISC in the morning and at bedtime.   hydrOXYzine  (ATARAX ) 25 MG tablet Take 25 mg by mouth 4 (four) times daily.   irbesartan  (AVAPRO ) 150 MG tablet Take 1 tablet (150 mg total) by mouth daily.   levocetirizine (XYZAL) 5 MG tablet Take 5 mg by mouth daily.   linaclotide  (LINZESS ) 290 MCG CAPS capsule Take 290 mcg by mouth daily before breakfast.   montelukast  (SINGULAIR ) 10 MG tablet Take 1 tablet (10 mg total) by mouth at bedtime.   omeprazole (PRILOSEC) 40 MG capsule PRN   rosuvastatin  (CRESTOR ) 20 MG tablet Take 1 tablet (20 mg total) by mouth daily.   tiZANidine  (ZANAFLEX ) 4 MG tablet Take 4 mg by mouth every 8 (eight) hours as needed for muscle spasms.     Allergies:   Penicillin g, Cephalexin, Etodolac,  Iodinated contrast media, Latex, Fish-derived products, Hydrochlorothiazide , Iodine, Penicillins, Shellfish allergy , and Lisinopril   Social History   Socioeconomic History   Marital status: Divorced    Spouse name: Not on file   Number of children: Not on file   Years of education: Not on file   Highest education level: Not on file  Occupational History   Occupation: Teacher    Comment: [employer]  Tobacco Use   Smoking status: Never    Passive exposure: Never   Smokeless tobacco: Never  Vaping Use   Vaping status: Never Used  Substance and Sexual Activity   Alcohol use: No   Drug use: Never   Sexual activity: Yes  Other Topics Concern   Not on file  Social History Narrative   Divorced.  Lives alone.  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   Moved from [place] to [place] to help her son and daughter-in-law with 3 grandchildren.   2 cups coffee a day.  No  alcohol.  Non-smoker.  No exercise.   Social Drivers of Corporate investment banker Strain: Low Risk  (05/01/2024)   Overall Financial Resource Strain (CARDIA)    Difficulty of Paying Living Expenses: Not hard at all  Food Insecurity: No Food Insecurity (05/01/2024)   Hunger Vital Sign    Worried About Running Out of Food in the Last Year: Never true    Ran Out of Food in the Last Year: Never true  Transportation Needs: No Transportation Needs (05/01/2024)   PRAPARE - Administrator, Civil Service (Medical): No    Lack of Transportation (Non-Medical): No  Physical Activity: Sufficiently Active (05/01/2024)   Exercise Vital Sign    Days of Exercise per Week: 4 days    Minutes of Exercise per Session: 150+ min  Stress: Stress Concern Present (05/01/2024)   [employer] of Occupational Health - Occupational Stress Questionnaire    Feeling of Stress : To some extent  Social Connections: Moderately Integrated  (05/01/2024)   Social Connection and Isolation Panel [NHANES]    Frequency of Communication with Friends and Family: More than three times a week    Frequency of Social Gatherings with Friends and Family: More than three times a week    Attends Religious Services: More than 4 times per year    Active Member of [employer] or Organizations: Yes    Attends Engineer, structural: More than 4 times per year    Marital Status: Divorced     Family History: The patient's family history includes Hypertension in her father and mother. There is no history of Breast cancer.  ROS:   Please see the history of present illness.     All other systems reviewed and are negative.  EKGs/Labs/Other Studies Reviewed:        Cardiac Studies & Procedures   ______________________________________________________________________________________________     ECHOCARDIOGRAM  ECHOCARDIOGRAM COMPLETE 01/11/2024  Narrative ECHOCARDIOGRAM REPORT    Patient Name:   Heather Mcintyre Date of Exam: 01/11/2024 Medical Rec #:  [id number]       Height:       68.0 in Accession #:    [id number]      Weight:       278.0 lb Date of Birth:  01/17/1966      BSA:          2.351 m Patient Age:    57 years        BP:           158/80 mmHg Patient Gender: F               HR:           58 bpm. Exam Location:  [place]  Procedure: 2D Echo, 3D Echo, Cardiac Doppler and Color Doppler (Both Spectral and Color Flow Doppler were utilized during procedure).  Indications:    R00.1 Bradycardia, unspecified; R07.9* Chest pain, unspecified  History:        Patient has no prior history of Echocardiogram examinations. Arrythmias:Bradycardia, Signs/Symptoms:Chest Pain and Shortness of Breath; Risk Factors:Non-Smoker.  Sonographer:    Malena Scull RDMS, RVT, RDCS Referring Phys: [id number] DAVID W HARDING  IMPRESSIONS   1. Left ventricular ejection fraction, by estimation, is 55 to 60%. The left ventricle has normal function. The  left ventricle has no regional wall motion abnormalities. Left ventricular diastolic parameters were normal. 2. Right ventricular systolic function is normal. The right ventricular size is normal. 3. The mitral valve is  normal in structure. Mild mitral valve regurgitation. 4. The aortic valve is tricuspid. Aortic valve regurgitation is not visualized. 5. The inferior vena cava is normal in size with greater than 50% respiratory variability, suggesting right atrial pressure of 3 mmHg.  FINDINGS Left Ventricle: Left ventricular ejection fraction, by estimation, is 55 to 60%. The left ventricle has normal function. The left ventricle has no regional wall motion abnormalities. Strain imaging was not performed. The left ventricular internal cavity size was normal in size. There is no left ventricular hypertrophy. Left ventricular diastolic parameters were normal.  Right Ventricle: The right ventricular size is normal. No increase in right ventricular wall thickness. Right ventricular systolic function is normal.  Left Atrium: Left atrial size was normal in size.  Right Atrium: Right atrial size was normal in size.  Pericardium: There is no evidence of pericardial effusion.  Mitral Valve: The mitral valve is normal in structure. Mild mitral valve regurgitation.  Tricuspid Valve: The tricuspid valve is normal in structure. Tricuspid valve regurgitation is mild.  Aortic Valve: The aortic valve is tricuspid. Aortic valve regurgitation is not visualized. Aortic valve mean gradient measures 2.0 mmHg. Aortic valve peak gradient measures 4.5 mmHg. Aortic valve area, by VTI measures 3.28 cm.  Pulmonic Valve: The pulmonic valve was not well visualized. Pulmonic valve regurgitation is not visualized.  Aorta: The aortic root and ascending aorta are structurally normal, with no evidence of dilitation.  Venous: The inferior vena cava is normal in size with greater than 50% respiratory variability,  suggesting right atrial pressure of 3 mmHg.  IAS/Shunts: No atrial level shunt detected by color flow Doppler.  Additional Comments: 3D was performed not requiring image post processing on an independent workstation and was indeterminate.   LEFT VENTRICLE PLAX 2D LVIDd:         5.10 cm      Diastology LVIDs:         3.30 cm      LV e' medial:    7.07 cm/s LV PW:         0.90 cm      LV E/e' medial:  13.0 LV IVS:        1.00 cm      LV e' lateral:   10.70 cm/s LVOT diam:     2.10 cm      LV E/e' lateral: 8.6 LV SV:         88 LV SV Index:   38 LVOT Area:     3.46 cm  3D Volume EF: LV Volumes (MOD)            3D EF:        59 % LV vol d, MOD A2C: 144.0 ml LV EDV:       237 ml LV vol d, MOD A4C: 119.0 ml LV ESV:       97 ml LV vol s, MOD A2C: 68.8 ml  LV SV:        140 ml LV vol s, MOD A4C: 55.3 ml LV SV MOD A2C:     75.2 ml LV SV MOD A4C:     119.0 ml LV SV MOD BP:      67.4 ml  RIGHT VENTRICLE             IVC RV Basal diam:  3.90 cm     IVC diam: 1.40 cm RV S prime:     12.00 cm/s TAPSE (M-mode): 2.8 cm  LEFT ATRIUM  Index        RIGHT ATRIUM           Index LA diam:        3.80 cm 1.62 cm/m   RA Area:     18.40 cm LA Vol (A2C):   82.2 ml 34.97 ml/m  RA Volume:   52.30 ml  22.25 ml/m LA Vol (A4C):   55.8 ml 23.74 ml/m LA Biplane Vol: 67.7 ml 28.80 ml/m AORTIC VALVE                    PULMONIC VALVE AV Area (Vmax):    3.66 cm     PV Vmax:       0.84 m/s AV Area (Vmean):   3.41 cm     PV Peak grad:  2.8 mmHg AV Area (VTI):     3.28 cm AV Vmax:           106.00 cm/s AV Vmean:          71.900 cm/s AV VTI:            0.269 m AV Peak Grad:      4.5 mmHg AV Mean Grad:      2.0 mmHg LVOT Vmax:         112.00 cm/s LVOT Vmean:        70.700 cm/s LVOT VTI:          0.255 m LVOT/AV VTI ratio: 0.95  AORTA Ao Root diam: 3.10 cm Ao Asc diam:  3.00 cm Ao Arch diam: 2.9 cm  MITRAL VALVE               TRICUSPID VALVE MV Area (PHT): 3.65 cm    TR Peak grad:    22.7 mmHg MV Decel Time: 208 msec    TR Vmax:        238.00 cm/s MV E velocity: 91.70 cm/s MV A velocity: 66.30 cm/s  SHUNTS MV E/A ratio:  1.38        Systemic VTI:  0.26 m Systemic Diam: 2.10 cm  Constancia Delton MD Electronically signed by Constancia Delton MD Signature Date/Time: 01/11/2024/5:57:21 PM    Final          ______________________________________________________________________________________________      Recent Labs: 12/27/2023: Hemoglobin 13.4; Platelets 294 01/09/2024: Magnesium 1.8 03/20/2024: ALT 15; BUN 19; Creatinine, Ser 0.81; Potassium 4.3; Sodium 142   Recent Lipid Panel    Component Value Date/Time   CHOL 177 03/20/2024 1028   TRIG 98 03/20/2024 1028   HDL 47 03/20/2024 1028   CHOLHDL 3.8 03/20/2024 1028   LDLCALC 112 (H) 03/20/2024 1028    Physical Exam:   VS:  BP (!) 160/80 (BP Location: Right Arm, Patient Position: Sitting) Comment (BP Location): Can't use left  Pulse 76   Ht 5\' 8"  (1.727 m)   Wt 275 lb 3.2 oz (124.8 kg)   SpO2 99%   BMI 41.84 kg/m  , BMI Body mass index is 41.84 kg/m. GENERAL:  Well appearing, overweight HEENT: Pupils equal round and reactive, fundi not visualized, oral mucosa unremarkable NECK:  No jugular venous distention, waveform within normal limits, carotid upstroke brisk and symmetric, no bruits, no thyromegaly LYMPHATICS:  No cervical adenopathy LUNGS:  Clear to auscultation bilaterally HEART:  RRR.  PMI not displaced or sustained,S1 and S2 within normal limits, no S3, no S4, no clicks, no rubs, no murmurs ABD:  Flat, positive bowel sounds normal in frequency in pitch, no bruits,  no rebound, no guarding, no midline pulsatile mass, no hepatomegaly, no splenomegaly EXT:  2 plus pulses throughout, no edema, no cyanosis no clubbing SKIN:  No rashes no nodules NEURO:  Cranial nerves II through XII grossly intact, motor grossly intact throughout PSYCH:  Cognitively intact, oriented to person place and  time   ASSESSMENT/PLAN:    HTN - BP not at goal <130/80.  Multiple prior intolerances, listed above. Requires dye-free medications.  Increase valsartan  from 150 to 300 mg daily Encouraged to bring BP cuff to next office visit to assess for accuracy as BP higher at home than in clinic. Continue Carvedilol  12.5mg  BID.  Labs in 7 to 10 days BMET, catecholamines, metanephrines, renin aldosterone. If BP persistently difficult to control could consider CTA abd/pelvis for evaluation of renal artery stenosis (ultrasound nondiagnostic) but would require premedication due to iodine allergy . Discussed with pharmacy team - consider Spironolactone as next agent as dye-free  OSA - presently wearing CPAP during day but not at night. Concern with irritation to skin with CPAP that she may be allergic to plastic. Encouraged to discuss with Dr. Kieran Pellet and/or DME provider alternate mask.  Discussed role CPAP placed in BP control.  Screening for Secondary Hypertension:     Relevant Labs/Studies:    Latest Ref Rng & Units 03/20/2024   10:28 AM 12/27/2023   11:08 AM 12/10/2023    7:11 PM  Basic Labs  Sodium 134 - 144 mmol/L 142  140  140   Potassium 3.5 - 5.2 mmol/L 4.3  3.9  3.8   Creatinine 0.57 - 1.00 mg/dL 1.61  0.96  0.45                    01/11/2024    8:45 AM  Renovascular   Renal Artery US  Completed Yes      Disposition:    FU with Dr. Addie Holstein in August as scheduled and with Advanced Hypertension Clinic in 4 months   Medication Adjustments/Labs and Tests Ordered: Current medicines are reviewed at length with the patient today.  Concerns regarding medicines are outlined above.  No orders of the defined types were placed in this encounter.  No orders of the defined types were placed in this encounter.    Signed, Clearnce Curia, NP  05/01/2024 3:31 PM    [facility]

## 2024-05-19 NOTE — Progress Notes (Unsigned)
 Patient attended a screening event on 03/08/2024 where her BP screening results was 174/106, Blood Glucose 93 not fasting. At the event the patient noted she has [employer] and does not smoke. Patient did not have any SDOH insecurities. Pt listed pcp as Dr. Lorel at [facility]. At the event pt was referred to contact pcp about BP.    Per chart review pt last pcp office visit is not visible in [facility]. Chart review shows that pt was seen by Alm LELON Clay, MD a Cardiologist after the event on 03/20/2024 and pt has an appointment with the Cardiologist on 07/17/2024. [facility] show that pt has a care team. Post event initial f/u [facility] called pt pcp office on 05/19/2024 to confirm that pt has an appt on 06/18/2024 and is established with pcp.  No additional Health equity team support indicated at this time.

## 2024-05-20 ENCOUNTER — Ambulatory Visit: Admitting: Sleep Medicine

## 2024-05-20 NOTE — Progress Notes (Deleted)
   [address] [place] [address] Dept: [phone]  FOLLOW UP NOTE  Patient ID: Heather Mcintyre, female    DOB: 05-11-1966  Age: 58 y.o. MRN: [id number] Date of Office Visit: 05/22/2024  Assessment  Chief Complaint: No chief complaint on file.  HPI Heather Mcintyre is a 58 year old female who presents to the clinic for follow-up visit.  She was last seen in this clinic on 02/19/2024 by Dr. Tobie for follow-up visit with skin prick testing.  Prior to that visit she was seen as a new patient on 02/05/2024 for evaluation of asthma, allergic rhinitis, urticaria, food allergy  to fish and shellfish, and oral allergy  syndrome.  Her last environmental allergy  skin testing on 02/19/2024 was positive to grass pollen, mold, cat, dust mite, and cockroach.  Her last food allergy  skin testing on 02/19/2024 was positive to fish and shellfish.  Discussed the use of AI scribe software for clinical note transcription with the patient, who gave verbal consent to proceed.  History of Present Illness      Drug Allergies:  Allergies  Allergen Reactions   Penicillin G Hives   Cephalexin Hives and Other (See Comments)   Etodolac Hives   Iodinated Contrast Media Hives   Latex Hives and Other (See Comments)   Fish-Derived Products Other (See Comments)   Hydrochlorothiazide  Itching   Iodine Hives   Penicillins    Shellfish Allergy  Hives   Lisinopril Other (See Comments) and Cough    Physical Exam: There were no vitals taken for this visit.   Physical Exam  Diagnostics:    Assessment and Plan: No diagnosis found.  No orders of the defined types were placed in this encounter.   There are no Patient Instructions on file for this visit.  No follow-ups on file.    Thank you for the opportunity to care for this patient.  Please do not hesitate to contact me with questions.  Arlean Mutter, FNP [facility]

## 2024-05-20 NOTE — Patient Instructions (Incomplete)
 Asthma Continue albuterol 2 puffs once every 4 hours if needed for cough or wheeze You may use albuterol 2 puffs 5 to 15 minutes before activity to decrease cough or wheeze  Allergic rhinitis Continue allergen avoidance measures directed toward grass pollen, mold, dust mite, cat, and cockroach as listed below Continue montelukast  10 mg once a day for control of allergy  symptoms Continue cetirizine  10 mg once a day if needed for runny nose or itch Continue Flonase  2 sprays in each nostril once a day if needed for stuffy nose.  In the right nostril, point the applicator out toward the right ear. In the left nostril, point the applicator out toward the left ear Continue azelastine  2 sprays in each nostril up to twice a day if needed for runny nose Consider saline nasal rinses as needed for nasal symptoms. Use this before any medicated nasal sprays for best result Consider allergen immunotherapy if your symptoms are not well-controlled with the treatment plan as listed above  Hives (urticaria) Take the least amount of medications while remaining hive free Cetirizine  (Zyrtec ) 10mg  twice a day and famotidine  (Pepcid ) 20 mg twice a day. If no symptoms for 7-14 days then decrease to. Cetirizine  (Zyrtec ) 10mg  twice a day and famotidine  (Pepcid ) 20 mg once a day.  If no symptoms for 7-14 days then decrease to. Cetirizine  (Zyrtec ) 10mg  twice a day.  If no symptoms for 7-14 days then decrease to. Cetirizine  (Zyrtec ) 10mg  once a day.  May use Benadryl (diphenhydramine) as needed for breakthrough hives       If symptoms return, then step up dosage  Keep a detailed symptom journal including foods eaten, contact with allergens, medications taken, weather changes.    Food allergy  Continue to avoid fish and shellfish.  In case of an allergic reaction, take cetirizine  10 mg once every 24 hours, and if life-threatening symptoms occur, inject with EpiPen  0.3 mg.  Oral allergy  syndrome Continue to avoid foods  that bother your mouth  Call the clinic if this treatment plan is not working well for you.  Follow up in *** or sooner if needed.  Reducing Pollen Exposure The American Academy of All[employer] , Asthma and Immunology suggests the following steps to reduce your exposure to pollen during allergy  seasons. Do not hang sheets or clothing out to dry; pollen may collect on these items. Do not mow lawns or spend time around freshly cut grass; mowing stirs up pollen. Keep windows closed at night.  Keep car windows closed while driving. Minimize morning activities outdoors, a time when pollen counts are usually at their highest. Stay indoors as much as possible when pollen counts or humidity is high and on windy days when pollen tends to remain in the air longer. Use air conditioning when possible.  Many air conditioners have filters that trap the pollen spores. Use a HEPA room air filter to remove pollen form the indoor air you breathe.  Control of Mold Allergen Mold and fungi can grow on a variety of surfaces provided certain temperature and moisture conditions exist.  Outdoor molds grow on plants, decaying vegetation and soil.  The major outdoor mold, Alternaria and Cladosporium, are found in very high numbers during hot and dry conditions.  Generally, a late Summer - Fall peak is seen for common outdoor fungal spores.  Rain will temporarily lower outdoor mold spore count, but counts rise rapidly when the rainy period ends.  The most important indoor molds are Aspergillus and Penicillium.  Dark, humid and poorly  ventilated basements are ideal sites for mold growth.  The next most common sites of mold growth are the bathroom and the kitchen.  Outdoor [employer] Use air conditioning and keep windows closed Avoid exposure to decaying vegetation. Avoid leaf raking. Avoid grain handling. Consider wearing a face mask if working in moldy areas.  Indoor Mold Control Maintain humidity below 50%. Clean  washable surfaces with 5% bleach solution. Remove sources e.g. Contaminated carpets.  Control of Dog or Cat Allergen Avoidance is the best way to manage a dog or cat allergy . If you have a dog or cat and are allergic to dog or cats, consider removing the dog or cat from the home. If you have a dog or cat but don't want to find it a new home, or if your family wants a pet even though someone in the household is allergic, here are some strategies that may help keep symptoms at bay:  Keep the pet out of your bedroom and restrict it to only a few rooms. Be advised that keeping the dog or cat in only one room will not limit the allergens to that room. Don't pet, hug or kiss the dog or cat; if you do, wash your hands with soap and water . High-efficiency particulate air (HEPA) cleaners run continuously in a bedroom or living room can reduce allergen levels over time. Regular use of a high-efficiency vacuum cleaner or a central vacuum can reduce allergen levels. Giving your dog or cat a bath at least once a week can reduce airborne allergen.   Control of Dust Mite Allergen Dust mites play a major role in allergic asthma and rhinitis. They occur in environments with high humidity wherever human skin is found. Dust mites absorb humidity from the atmosphere (ie, they do not drink) and feed on organic matter (including shed human and animal skin). Dust mites are a microscopic type of insect that you cannot see with the naked eye. High levels of dust mites have been detected from mattresses, pillows, carpets, upholstered furniture, bed covers, clothes, soft toys and any woven material. The principal allergen of the dust mite is found in its feces. A gram of dust may contain 1,000 mites and 250,000 fecal particles. Mite antigen is easily measured in the air during house cleaning activities. Dust mites do not bite and do not cause harm to humans, other than by triggering allergies/asthma.  Ways to decrease your  exposure to dust mites in your home:  1. Encase mattresses, box springs and pillows with a mite-impermeable barrier or cover  2. Wash sheets, blankets and drapes weekly in hot water  (130 F) with detergent and dry them in a dryer on the hot setting.  3. Have the room cleaned frequently with a vacuum cleaner and a damp dust-mop. For carpeting or rugs, vacuuming with a vacuum cleaner equipped with a high-efficiency particulate air (HEPA) filter. The dust mite allergic individual should not be in a room which is being cleaned and should wait 1 hour after cleaning before going into the room.  4. Do not sleep on upholstered furniture (eg, couches).  5. If possible removing carpeting, upholstered furniture and drapery from the home is ideal. Horizontal blinds should be eliminated in the rooms where the person spends the most time (bedroom, study, television room). Washable vinyl, roller-type shades are optimal.  6. Remove all non-washable stuffed toys from the bedroom. Wash stuffed toys weekly like sheets and blankets above.  7. Reduce indoor humidity to less than 50%. Inexpensive  humidity monitors can be purchased at most hardware stores. Do not use a humidifier as can make the problem worse and are not recommended.  Control of Cockroach Allergen Cockroach allergen has been identified as an important cause of acute attacks of asthma, especially in urban settings.  There are fifty-five species of cockroach that exist in the [place] , however only three, the [place], [place] and [place] species produce allergen that can affect patients with Asthma.  Allergens can be obtained from fecal particles, egg casings and secretions from cockroaches.    Remove food sources. Reduce access to water . Seal access and entry points. Spray runways with 0.5-1% Diazinon or Chlorpyrifos Blow boric acid power under stoves and refrigerator. Place bait stations (hydramethylnon) at feeding sites.

## 2024-05-22 ENCOUNTER — Ambulatory Visit: Admitting: Family Medicine

## 2024-05-26 NOTE — Patient Instructions (Signed)
 Asthma Continue albuterol 2 puffs once every 4 hours if needed for cough or wheeze You may use albuterol 2 puffs 5 to 15 minutes before activity to decrease cough or wheeze  Allergic rhinitis Continue allergen avoidance measures directed toward grass pollen, mold, dust mite, cat, and cockroach as listed below Continue montelukast  10 mg once a day for control of allergy  symptoms Continue cetirizine  10 mg once a day if needed for runny nose or itch Continue Flonase  2 sprays in each nostril once a day if needed for stuffy nose.  In the right nostril, point the applicator out toward the right ear. In the left nostril, point the applicator out toward the left ear Continue azelastine  2 sprays in each nostril up to twice a day if needed for runny nose Consider saline nasal rinses as needed for nasal symptoms. Use this before any medicated nasal sprays for best result Consider allergen immunotherapy if your symptoms are not well-controlled with the treatment plan as listed above  Hives (urticaria) Take the least amount of medications while remaining hive free Cetirizine  (Zyrtec ) 10mg  twice a day and famotidine  (Pepcid ) 20 mg twice a day. If no symptoms for 7-14 days then decrease to. Cetirizine  (Zyrtec ) 10mg  twice a day and famotidine  (Pepcid ) 20 mg once a day.  If no symptoms for 7-14 days then decrease to. Cetirizine  (Zyrtec ) 10mg  twice a day.  If no symptoms for 7-14 days then decrease to. Cetirizine  (Zyrtec ) 10mg  once a day.  May use Benadryl (diphenhydramine) as needed for breakthrough hives       If symptoms return, then step up dosage  Keep a detailed symptom journal including foods eaten, contact with allergens, medications taken, weather changes.    Food allergy  Continue to avoid fish and shellfish.  In case of an allergic reaction, take cetirizine  10 mg once every 24 hours, and if life-threatening symptoms occur, inject with EpiPen  0.3 mg.  Oral allergy  syndrome Continue to avoid foods  that bother your mouth  Call the clinic if this treatment plan is not working well for you.  Follow up in *** or sooner if needed.  Reducing Pollen Exposure The American Academy of Allergy , Asthma and Immunology suggests the following steps to reduce your exposure to pollen during allergy  seasons. Do not hang sheets or clothing out to dry; pollen may collect on these items. Do not mow lawns or spend time around freshly cut grass; mowing stirs up pollen. Keep windows closed at night.  Keep car windows closed while driving. Minimize morning activities outdoors, a time when pollen counts are usually at their highest. Stay indoors as much as possible when pollen counts or humidity is high and on windy days when pollen tends to remain in the air longer. Use air conditioning when possible.  Many air conditioners have filters that trap the pollen spores. Use a HEPA room air filter to remove pollen form the indoor air you breathe.  Control of Mold Allergen Mold and fungi can grow on a variety of surfaces provided certain temperature and moisture conditions exist.  Outdoor molds grow on plants, decaying vegetation and soil.  The major outdoor mold, Alternaria and Cladosporium, are found in very high numbers during hot and dry conditions.  Generally, a late Summer - Fall peak is seen for common outdoor fungal spores.  Rain will temporarily lower outdoor mold spore count, but counts rise rapidly when the rainy period ends.  The most important indoor molds are Aspergillus and Penicillium.  Dark, humid and poorly  ventilated basements are ideal sites for mold growth.  The next most common sites of mold growth are the bathroom and the kitchen.  Outdoor [employer] Use air conditioning and keep windows closed Avoid exposure to decaying vegetation. Avoid leaf raking. Avoid grain handling. Consider wearing a face mask if working in moldy areas.  Indoor Mold Control Maintain humidity below 50%. Clean  washable surfaces with 5% bleach solution. Remove sources e.g. Contaminated carpets.  Control of Dog or Cat Allergen Avoidance is the best way to manage a dog or cat allergy . If you have a dog or cat and are allergic to dog or cats, consider removing the dog or cat from the home. If you have a dog or cat but don't want to find it a new home, or if your family wants a pet even though someone in the household is allergic, here are some strategies that may help keep symptoms at bay:  Keep the pet out of your bedroom and restrict it to only a few rooms. Be advised that keeping the dog or cat in only one room will not limit the allergens to that room. Don't pet, hug or kiss the dog or cat; if you do, wash your hands with soap and water . High-efficiency particulate air (HEPA) cleaners run continuously in a bedroom or living room can reduce allergen levels over time. Regular use of a high-efficiency vacuum cleaner or a central vacuum can reduce allergen levels. Giving your dog or cat a bath at least once a week can reduce airborne allergen.   Control of Dust Mite Allergen Dust mites play a major role in allergic asthma and rhinitis. They occur in environments with high humidity wherever human skin is found. Dust mites absorb humidity from the atmosphere (ie, they do not drink) and feed on organic matter (including shed human and animal skin). Dust mites are a microscopic type of insect that you cannot see with the naked eye. High levels of dust mites have been detected from mattresses, pillows, carpets, upholstered furniture, bed covers, clothes, soft toys and any woven material. The principal allergen of the dust mite is found in its feces. A gram of dust may contain 1,000 mites and 250,000 fecal particles. Mite antigen is easily measured in the air during house cleaning activities. Dust mites do not bite and do not cause harm to humans, other than by triggering allergies/asthma.  Ways to decrease your  exposure to dust mites in your home:  1. Encase mattresses, box springs and pillows with a mite-impermeable barrier or cover  2. Wash sheets, blankets and drapes weekly in hot water  (130 F) with detergent and dry them in a dryer on the hot setting.  3. Have the room cleaned frequently with a vacuum cleaner and a damp dust-mop. For carpeting or rugs, vacuuming with a vacuum cleaner equipped with a high-efficiency particulate air (HEPA) filter. The dust mite allergic individual should not be in a room which is being cleaned and should wait 1 hour after cleaning before going into the room.  4. Do not sleep on upholstered furniture (eg, couches).  5. If possible removing carpeting, upholstered furniture and drapery from the home is ideal. Horizontal blinds should be eliminated in the rooms where the person spends the most time (bedroom, study, television room). Washable vinyl, roller-type shades are optimal.  6. Remove all non-washable stuffed toys from the bedroom. Wash stuffed toys weekly like sheets and blankets above.  7. Reduce indoor humidity to less than 50%. Inexpensive  humidity monitors can be purchased at most hardware stores. Do not use a humidifier as can make the problem worse and are not recommended.  Control of Cockroach Allergen Cockroach allergen has been identified as an important cause of acute attacks of asthma, especially in urban settings.  There are fifty-five species of cockroach that exist in the [place] , however only three, the [place], [place] and [place] species produce allergen that can affect patients with Asthma.  Allergens can be obtained from fecal particles, egg casings and secretions from cockroaches.    Remove food sources. Reduce access to water . Seal access and entry points. Spray runways with 0.5-1% Diazinon or Chlorpyrifos Blow boric acid power under stoves and refrigerator. Place bait stations (hydramethylnon) at feeding sites.

## 2024-05-26 NOTE — Progress Notes (Signed)
 [address] [place] [address] Dept: [phone]  FOLLOW UP NOTE  Patient ID: Heather Mcintyre, female    DOB: [birth year]  Age: 58 y.o. MRN: [id number] Date of Office Visit: 05/29/2024  Assessment  Chief Complaint: Seasonal allergic rhinitis and Follow-up (Itchy real bad and dry skin)  HPI Heather Mcintyre is a 58 year old female who presents to the clinic for follow-up visit.  She was last seen in this clinic on 02/19/2024 by Dr. Tobie for follow-up visit with skin prick testing.  Prior to that visit she was seen as a new patient on 02/05/2024 for evaluation of asthma, allergic rhinitis, urticaria, food allergy  to fish and shellfish, and oral allergy  syndrome.  At today's visit, she reports that her asthma has been well controlled with no shortness of breath, cough, or wheeze with activity or rest. She reports that she uses albuterol about once a week with relief of symptoms.  Her last environmental allergy  skin testing on 02/19/2024 was positive to grass pollen, mold, cat, dust mite, and cockroach.  Her last food allergy  skin testing on 02/19/2024 was positive to fish and shellfish.  Allergic rhinitis is reported as moderately well controlled with symptoms including clear rhinorrhea, nasal congestion, sneezing, and post nasal drainage for which she continues a daily antihistamine. She reports that she frequently switched between antihistamines. She continues Flonase  and azelastine  daily. We discussed allergen immunotherapy and she is not interested at this time.   Urticaria is reported as poorly controlled with episodes occurring randomly and lasting a few days at a time. She denies cardiopulmonary, gastrointestinal, or integumentary symptoms with the hives. She reports that she can occasionally feel her forehead swelling. She continues cetirizine  and is out of famotidine . We discussed Xolair injections as a possibility  for hive control.   Her current medications are listed in the  chart.  Drug Allergies:  Allergies  Allergen Reactions   Penicillin G Hives   Cephalexin Hives and Other (See Comments)   Etodolac Hives   Iodinated Contrast Media Hives   Latex Hives and Other (See Comments)   Fish-Derived Products Other (See Comments)   Hydrochlorothiazide  Itching   Iodine Hives   Penicillins    Shellfish Allergy  Hives   Lisinopril Other (See Comments) and Cough    Physical Exam: BP (!) 140/88 (BP Location: Right Arm, Patient Position: Sitting, Cuff Size: Large)   Pulse (!) 58   Temp 98.4 F (36.9 C) (Temporal)   Wt 274 lb (124.3 kg)   SpO2 98%   BMI 41.66 kg/m    Physical Exam Vitals reviewed.  Constitutional:      Appearance: Normal appearance.  HENT:     Head: Normocephalic and atraumatic.     Right Ear: Tympanic membrane normal.     Left Ear: Tympanic membrane normal.     Nose:     Comments: Bilateral nares erythematous and pale with thin clear nasal drainage noted. Pharynx normal. Ears normal. Eyes normal.    Mouth/Throat:     Pharynx: Oropharynx is clear.  Eyes:     Conjunctiva/sclera: Conjunctivae normal.  Cardiovascular:     Rate and Rhythm: Normal rate and regular rhythm.     Heart sounds: Normal heart sounds. No murmur heard. Pulmonary:     Effort: Pulmonary effort is normal.     Breath sounds: Normal breath sounds.     Comments: Lungs clear to ausculation  Musculoskeletal:        General: Normal range of motion.  Cervical back: Normal range of motion and neck supple.  Skin:    General: Skin is warm and dry.  Neurological:     Mental Status: She is alert and oriented to person, place, and time.  Psychiatric:        Mood and Affect: Mood normal.        Behavior: Behavior normal.        Thought Content: Thought content normal.        Judgment: Judgment normal.      Assessment and Plan: 1. Mild intermittent asthma without complication   2. Seasonal allergic rhinitis due to pollen   3. Papular urticaria   4. Allergy  with  anaphylaxis due to food   5. Pollen-food allergy , subsequent encounter     Meds ordered this encounter  Medications   pimecrolimus  (ELIDEL ) 1 % cream    Sig: Apply topically 2 (two) times daily.    Dispense:  30 g    Refill:  0    Patient Instructions  Asthma Continue albuterol 2 puffs once every 4 hours if needed for cough or wheeze You may use albuterol 2 puffs 5 to 15 minutes before activity to decrease cough or wheeze  Allergic rhinitis Continue allergen avoidance measures directed toward grass pollen, mold, dust mite, cat, and cockroach as listed below Continue montelukast  10 mg once a day for control of allergy  symptoms Continue cetirizine  10 mg once a day if needed for runny nose or itch Continue Flonase  2 sprays in each nostril once a day if needed for stuffy nose.  In the right nostril, point the applicator out toward the right ear. In the left nostril, point the applicator out toward the left ear Continue azelastine  2 sprays in each nostril up to twice a day if needed for runny nose Consider saline nasal rinses as needed for nasal symptoms. Use this before any medicated nasal sprays for best result Consider allergen immunotherapy if your symptoms are not well-controlled with the treatment plan as listed above  Hives (urticaria) Take the least amount of medications while remaining hive free Cetirizine  (Zyrtec ) 10mg  twice a day and famotidine  (Pepcid ) 20 mg twice a day. If no symptoms for 7-14 days then decrease to. Cetirizine  (Zyrtec ) 10mg  twice a day and famotidine  (Pepcid ) 20 mg once a day.  If no symptoms for 7-14 days then decrease to. Cetirizine  (Zyrtec ) 10mg  twice a day.  If no symptoms for 7-14 days then decrease to. Cetirizine  (Zyrtec ) 10mg  once a day.  May use Benadryl (diphenhydramine) as needed for breakthrough hives       If symptoms return, then step up dosage  Keep a detailed symptom journal including foods eaten, contact with allergens, medications taken,  weather changes.  Begin Elidel  to red or itchy areas up to twice a day if needed  Food allergy  Continue to avoid fish and shellfish.  In case of an allergic reaction, take cetirizine  10 mg once every 24 hours, and if life-threatening symptoms occur, inject with EpiPen  0.3 mg.  Oral allergy  syndrome Continue to avoid foods that bother your mouth  Call the clinic if this treatment plan is not working well for you.  Follow up in 3 months or sooner if needed.  Return in about 3 months (around 08/29/2024), or if symptoms worsen or fail to improve.    Thank you for the opportunity to care for this patient.  Please do not hesitate to contact me with questions.  Arlean Mutter, FNP [facility] 

## 2024-05-27 ENCOUNTER — Ambulatory Visit: Admitting: Sleep Medicine

## 2024-05-27 ENCOUNTER — Encounter: Payer: Self-pay | Admitting: Sleep Medicine

## 2024-05-27 VITALS — BP 140/80 | HR 71 | Ht 68.0 in | Wt 273.0 lb

## 2024-05-27 DIAGNOSIS — G4700 Insomnia, unspecified: Secondary | ICD-10-CM

## 2024-05-27 DIAGNOSIS — I10 Essential (primary) hypertension: Secondary | ICD-10-CM

## 2024-05-27 DIAGNOSIS — F5104 Psychophysiologic insomnia: Secondary | ICD-10-CM

## 2024-05-27 DIAGNOSIS — G4733 Obstructive sleep apnea (adult) (pediatric): Secondary | ICD-10-CM | POA: Diagnosis not present

## 2024-05-27 MED ORDER — TRAZODONE HCL 50 MG PO TABS: 50.0000 mg | ORAL_TABLET | Freq: Every day | ORAL | 0 refills | Status: DC

## 2024-05-27 NOTE — Progress Notes (Unsigned)
 Name:Gavyn MERCADIES CO MRN: [id number] DOB: 1966-08-01   CHIEF COMPLAINT:  CPAP F/U   HISTORY OF PRESENT ILLNESS:  Mrs. Sypher is a 58 y.o. w/ a h/o OSA, HTN, hyperlipidemia, GERD and morbid obesity who presents for CPAP F/U visit. Reports using CPAP therapy almost every night, however has difficulty keeping on her mask the entire night due to mask discomfort. Also reports difficulty with sleep maintenance and feeling unrefreshed upon awakening.    EPWORTH SLEEP SCORE    01/24/2024    9:32 AM  Results of the Epworth flowsheet  Sitting and reading 1  Watching TV 1  Sitting, inactive in a public place (e.g. a theatre or a meeting) 0  As a passenger in a car for an hour without a break 0  Lying down to rest in the afternoon when circumstances permit 0  Sitting and talking to someone 0  Sitting quietly after a lunch without alcohol 0  In a car, while stopped for a few minutes in traffic 0  Total score 2     PAST MEDICAL HISTORY :   has a past medical history of Anxiety, Asthma, Breast cancer (HCC), Cervical cancer (HCC), Complication of anesthesia, Degenerative joint disease/osteoarthritis, Depression, GERD (gastroesophageal reflux disease), Hypertension, Lupus, and Pre-diabetes.  has a past surgical history that includes Abdominal hysterectomy; Replacement total knee bilateral; Cholecystectomy (N/A); Laparoscopic gastric band removal with laparoscopic gastric sleeve resection; Tubal ligation; Breast biopsy (Left, 10/04/2022); Joint replacement; Colonoscopy with esophagogastroduodenoscopy (egd); Breast lumpectomy,radio freq localizer,axillary sentinel lymph node biopsy (Left, 11/07/2022); and Incision and drainage abscess (Left, 04/11/2023). Prior to Admission medications   Medication Sig Start Date End Date Taking? Authorizing Provider  acetaminophen  (TYLENOL ) 500 MG tablet Take 2 tablets (1,000 mg total) by mouth every 6 (six) hours as needed for mild pain. 11/07/22  Yes  Piscoya, Jose, MD  ALOE VERA PO Take 1 tablet by mouth daily. Natural aloe   Yes [provider]  anastrozole  (ARIMIDEX ) 1 MG tablet Take 1 tablet (1 mg total) by mouth daily. 02/13/24  Yes Jacobo Evalene PARAS, MD  ascorbic acid  (VITAMIN C ) 500 MG tablet Take 500 mg by mouth daily.   Yes [provider]  azelastine  (ASTELIN ) 0.1 % nasal spray Place 2 sprays into both nostrils 2 (two) times daily as needed for rhinitis or allergies. 02/19/24  Yes Tobie Arleta SQUIBB, MD  B COMPLEX VITAMINS PO Take 1 tablet by mouth daily.   Yes [provider]  carvedilol  (COREG ) 12.5 MG tablet Take 1 tablet (12.5 mg total) by mouth 2 (two) times daily. 01/22/24 05/27/24 Yes Wittenborn, Deborah, NP  celecoxib  (CELEBREX ) 200 MG capsule Take 1 capsule (200 mg total) by mouth 2 (two) times daily as needed for moderate pain. 11/07/22  Yes Piscoya, Aloysius, MD  cetirizine  (ZYRTEC ) 10 MG tablet Take 1 tablet (10 mg total) by mouth 2 (two) times daily as needed for allergies (or hives). 02/19/24  Yes Tobie Arleta SQUIBB, MD  colchicine  0.6 MG tablet Take 0.6 mg by mouth as needed. 04/29/24  Yes [provider]  diphenhydrAMINE (BENADRYL) 25 mg capsule Take 25 mg by mouth every 6 (six) hours as needed.   Yes [provider]  EPINEPHrine  0.3 mg/0.3 mL IJ SOAJ injection Inject 0.3 mg into the muscle. 01/29/24  Yes [provider]  fluticasone  (FLONASE ) 50 MCG/ACT nasal spray Place 2 sprays into both nostrils daily. 02/19/24  Yes Tobie Arleta SQUIBB, MD  furosemide (LASIX) 40 MG tablet  Take 40 mg by mouth daily as needed.   Yes [provider]  gabapentin  (NEURONTIN ) 600 MG tablet Take 600 mg by mouth 3 (three) times daily. 04/29/24  Yes [provider]  GLOBAL EASE INJECT PEN NEEDLES 32G X 4 MM MISC in the morning and at bedtime. 12/04/23  Yes [provider]  hydrOXYzine  (ATARAX ) 25 MG tablet Take 25 mg by mouth 4 (four) times daily. 04/10/24  Yes [provider]   irbesartan  (AVAPRO ) 300 MG tablet Take 1 tablet (300 mg total) by mouth daily. 05/01/24  Yes Walker, Caitlin S, NP  levocetirizine (XYZAL) 5 MG tablet Take 5 mg by mouth daily. 04/24/24  Yes [provider]  linaclotide  (LINZESS ) 290 MCG CAPS capsule Take 290 mcg by mouth daily before breakfast.   Yes [provider]  montelukast  (SINGULAIR ) 10 MG tablet Take 1 tablet (10 mg total) by mouth at bedtime. 02/19/24  Yes Tobie Arleta SQUIBB, MD  multivitamin-lutein [facility]) CAPS capsule Take 1 capsule by mouth as needed.   Yes [provider]  rosuvastatin  (CRESTOR ) 20 MG tablet Take 1 tablet (20 mg total) by mouth daily. 04/04/24 07/03/24 Yes Anner Alm ORN, MD  tiZANidine  (ZANAFLEX ) 4 MG tablet Take 4 mg by mouth every 8 (eight) hours as needed for muscle spasms.   Yes [provider]  VENTOLIN HFA 108 (90 Base) MCG/ACT inhaler Inhale 2 puffs into the lungs every 6 (six) hours as needed. 01/29/24  Yes [provider]  BYETTA 5 MCG PEN 5 MCG/0.02ML SOPN injection Inject 5 mcg into the skin 2 (two) times daily with a meal. 12/06/23   [provider]  omeprazole (PRILOSEC) 40 MG capsule PRN Patient not taking: Reported on 05/27/2024 09/13/17   [provider]  metoprolol  succinate (TOPROL -XL) 50 MG 24 hr tablet Take 50 mg by mouth daily. Take with or immediately following a meal. Patient not taking: No sig reported    [provider]   Allergies  Allergen Reactions   Penicillin G Hives   Cephalexin Hives and Other (See Comments)   Etodolac Hives   Iodinated Contrast Media Hives   Latex Hives and Other (See Comments)   Fish-Derived Products Other (See Comments)   Hydrochlorothiazide  Itching   Iodine Hives   Penicillins    Shellfish Allergy  Hives   Lisinopril Other (See Comments) and Cough    FAMILY HISTORY:  family history includes Hypertension in her father and mother. SOCIAL HISTORY:  reports that she has never smoked. She  has never been exposed to tobacco smoke. She has never used smokeless tobacco. She reports that she does not drink alcohol and does not use drugs.   Review of Systems:  Gen:  Denies  fever, sweats, chills weight loss  HEENT: Denies blurred vision, double vision, ear pain, eye pain, hearing loss, nose bleeds, sore throat Cardiac:  No dizziness, chest pain or heaviness, chest tightness,edema, No JVD Resp:   No cough, -sputum production, -shortness of breath,-wheezing, -hemoptysis,  Gi: Denies swallowing difficulty, stomach pain, nausea or vomiting, diarrhea, constipation, bowel incontinence Gu:  Denies bladder incontinence, burning urine Ext:   Denies Joint pain, stiffness or swelling Skin: Denies  skin rash, easy bruising or bleeding or hives Endoc:  Denies polyuria, polydipsia , polyphagia or weight change Psych:   Denies depression, insomnia or hallucinations  Other:  All other systems negative  VITAL SIGNS: BP (!) 140/80   Pulse 71   Ht 5' 8 (1.727 m)   Wt 273  lb (123.8 kg)   SpO2 96%   BMI 41.51 kg/m    Physical Examination:   General Appearance: No distress  EYES PERRLA, EOM intact.   NECK Supple, No JVD Pulmonary: normal breath sounds, No wheezing.  CardiovascularNormal S1,S2.  No m/r/g.   Abdomen: Benign, Soft, non-tender. Skin:   warm, no rashes, no ecchymosis  Extremities: normal, no cyanosis, clubbing. Neuro:without focal findings,  speech normal  PSYCHIATRIC: Mood, affect within normal limits.   ASSESSMENT AND PLAN  OSA Patient is using and benefiting from CPAP therapy. Counseled patient on increasing CPAP compliance. For mask discomfort, advised patient to use the Airfit P30i nasal pillow mask. Discussed the consequences of untreated sleep apnea. Advised not to drive drowsy for safety of patient and others. Will follow up in 3 months to review CPAP efficacy and compliance data.     HTN Stable, on current management. Following with PCP.   Insomnia Will try  patient on Trazodone  25 mg nightly, will follow up in 3 months.    Patient  satisfied with Plan of action and management. All questions answered  I spent a total of 20 minutes reviewing chart data, face-to-face evaluation with the patient, counseling and coordination of care as detailed above.    Katheline Brendlinger, M.D.  Sleep Medicine [facility]

## 2024-05-29 ENCOUNTER — Other Ambulatory Visit: Payer: Self-pay

## 2024-05-29 ENCOUNTER — Ambulatory Visit (INDEPENDENT_AMBULATORY_CARE_PROVIDER_SITE_OTHER): Admitting: Family Medicine

## 2024-05-29 ENCOUNTER — Encounter: Payer: Self-pay | Admitting: Family Medicine

## 2024-05-29 VITALS — BP 140/88 | HR 58 | Temp 98.4°F | Wt 274.0 lb

## 2024-05-29 DIAGNOSIS — J4520 Mild intermittent asthma, uncomplicated: Secondary | ICD-10-CM | POA: Diagnosis not present

## 2024-05-29 DIAGNOSIS — T7800XA Anaphylactic reaction due to unspecified food, initial encounter: Secondary | ICD-10-CM

## 2024-05-29 DIAGNOSIS — T781XXD Other adverse food reactions, not elsewhere classified, subsequent encounter: Secondary | ICD-10-CM

## 2024-05-29 DIAGNOSIS — J301 Allergic rhinitis due to pollen: Secondary | ICD-10-CM | POA: Diagnosis not present

## 2024-05-29 DIAGNOSIS — L282 Other prurigo: Secondary | ICD-10-CM

## 2024-05-29 MED ORDER — PIMECROLIMUS 1 % EX CREA: TOPICAL_CREAM | Freq: Two times a day (BID) | CUTANEOUS | 0 refills | Status: AC

## 2024-05-31 ENCOUNTER — Encounter: Payer: Self-pay | Admitting: Family Medicine

## 2024-05-31 DIAGNOSIS — L282 Other prurigo: Secondary | ICD-10-CM | POA: Insufficient documentation

## 2024-05-31 DIAGNOSIS — J301 Allergic rhinitis due to pollen: Secondary | ICD-10-CM | POA: Insufficient documentation

## 2024-05-31 DIAGNOSIS — J4520 Mild intermittent asthma, uncomplicated: Secondary | ICD-10-CM | POA: Insufficient documentation

## 2024-05-31 DIAGNOSIS — T7800XA Anaphylactic reaction due to unspecified food, initial encounter: Secondary | ICD-10-CM | POA: Insufficient documentation

## 2024-06-02 ENCOUNTER — Telehealth: Payer: Self-pay

## 2024-06-02 NOTE — Telephone Encounter (Signed)
*  AA  Pharmacy Patient Advocate Encounter   Received notification from CoverMyMeds that prior authorization for Pimecrolimus  1% cream  is required/requested.   Insurance verification completed.   The patient is insured through [place] .   Per test claim: PA required; PA submitted to above mentioned insurance via CoverMyMeds Key/confirmation #/EOC [facility] Status is pending

## 2024-06-03 NOTE — Telephone Encounter (Signed)
 Can you please try desonide  0.05% cream? Thank you

## 2024-06-03 NOTE — Telephone Encounter (Signed)
 Pharmacy Patient Advocate Encounter  Received notification from [employer] that Prior Authorization for Pimecrolimus  1% has been DENIED.  Full denial letter will be uploaded to the media tab. See denial reason below.   You asked for the drug above for your Other  prurigo. This is an off-label use that is not  medically accepted. The Medicare rule in the  Prescription Drug Benefit Manual (Chapter 6,  Section 10.6) says a drug must be used for a  medically accepted indication (covered use).  Off-label use is medically accepted when there  is proof in one or more of the drug guides that  the drug works for your condition. We look at  the two major drug guides (compendia): the  DRUGDEX Information System and the  [facility] Formulary Service Drug  Information (AHFS-DI). [employer] has decided  that there is no proof in either drug guide that  this drug works for your condition. Per Medicare rules, it is not covered.

## 2024-06-04 NOTE — Telephone Encounter (Signed)
 Apply to red and itchy areas up to twice a day if needed. Do not use this medication longer than 2 weeks in a row. Thank you so much

## 2024-06-05 MED ORDER — DESONIDE 0.05 % EX CREA: TOPICAL_CREAM | CUTANEOUS | 5 refills | Status: AC

## 2024-06-05 NOTE — Addendum Note (Signed)
 Addended by: MENDEZ-MUNGARAY, Addalyn Speedy M on: 06/05/2024 05:56 PM   Modules accepted: Orders

## 2024-06-05 NOTE — Telephone Encounter (Signed)
 I called the patient and informed of change. I informed if not covered to call the office and let us  know.

## 2024-06-06 ENCOUNTER — Telehealth: Admitting: Pain Medicine

## 2024-06-08 NOTE — Progress Notes (Unsigned)
 PROVIDER NOTE: Interpretation of information contained herein should be left to medically-trained personnel. Specific patient instructions are provided elsewhere under Patient Instructions section of medical record. This document was created in part using AI and STT-dictation technology, any transcriptional errors that may result from this process are unintentional.  Patient: Heather Mcintyre  Service: E/M   PCP: Lorel Maxie LABOR, MD  DOB: 1966-01-26  DOS: 06/09/2024  Provider: Eric LABOR Como, MD  MRN: [id number]  Delivery: Face-to-face  Specialty: Interventional Pain Management  Type: Established Patient  Setting: Ambulatory outpatient facility  Specialty designation: 09  Referring Prov.: Lorel Maxie LABOR, MD  Location: Outpatient office facility       History of present illness (HPI) Heather Mcintyre, a 58 y.o. year old female, is here today because of her Chronic bilateral low back pain without sciatica [M54.50, G89.29]. Heather Mcintyre primary complain today is No chief complaint on file.  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 [facility]); Malignant neoplasm of left breast in female, estrogen receptor positive (HCC); Spondylolisthesis, lumbar region; Arthropathy of lumbar facet joint; Lumbar spondylosis; Chronic joint pain; History of knee replacement (Bilateral); Chronic low back pain (1ry area of Pain) (Bilateral) (L>R) w/o sciatica; Osteoarthritis; Osteoarthritis of ankle; Pes planovalgus, acquired (Left); Pes planovalgus, acquired (Right); Radiculopathy of lumbar region; Sacroiliac joint pain; Pain in joint of shoulder (Left); Chronic pain syndrome; Abnormal MRI, lumbar spine (03/14/2019); Lumbar facet hypertrophy (Multilevel) (Bilateral); Lumbar foraminal stenosis (Multilevel) (Bilateral); Lumbosacral foraminal stenosis (Severe) (L5-S1) (Bilateral); 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  Pain Assessment: Severity of   is reported as a  /10. Location:    / . Onset:  . Quality:  . Timing:  . Modifying factor(s):  SABRA Vitals:  vitals were not taken for this visit.  BMI: Estimated body mass index is 41.66 kg/m as calculated from the following:   Height as of 05/27/24: 5' 8 (1.727 m).   Weight as of 05/29/24: 274 lb (124.3 kg).  Last encounter: 01/09/2024. Last procedure: Visit date not found.  Reason for encounter: follow-up evaluation.   Discussed the use of AI scribe software for clinical note transcription with the patient, who gave verbal consent to proceed.  History of Present Illness           Pharmacotherapy Assessment   No chronic opioid analgesics therapy prescribed by our practice. None. MME/day: 0 mg/day   Monitoring: [place] PMP: PDMP reviewed during this encounter.       Pharmacotherapy: No side-effects or adverse reactions reported. Compliance: No problems identified. Effectiveness: Clinically acceptable.  No notes on file  UDS:  Summary  Date Value Ref Range Status  12/17/2023 FINAL  Final    Comment:    ==================================================================== Compliance Drug Analysis, Ur ==================================================================== Test                             Result       Flag       Units  Drug Present and Declared for Prescription Verification   Gabapentin                      PRESENT      EXPECTED   Acetaminophen                   PRESENT       EXPECTED   Diphenhydramine                PRESENT      EXPECTED   Hydroxyzine                     PRESENT      EXPECTED   Metoprolol                      PRESENT      EXPECTED  Drug Absent but Declared for Prescription Verification   Tizanidine                      Not Detected UNEXPECTED    Tizanidine , as indicated in the declared medication list, is not    always detected even when used as directed.  ==================================================================== Test                      Result    Flag   Units      Ref Range   Creatinine              145              mg/dL      >=79 ==================================================================== Declared Medications:  The flagging and interpretation on this report are based on the  following declared medications.  Unexpected results may arise from  inaccuracies in the declared medications.   **Note: The testing scope of this panel includes these medications:   Diphenhydramine (Benadryl)  Gabapentin   Hydroxyzine  (Atarax )  Metoprolol  (Toprol )   **Note: The testing scope of this panel does not include small to  moderate amounts of these reported medications:   Acetaminophen   Tizanidine  (Zanaflex )   **Note: The testing scope of this panel does not include the  following reported medications:   Aloe Vera  Anastrozole  (Arimidex )  Celecoxib  (Celebrex )  Cetirizine  (Zyrtec )  Colchicine   Fluticasone  (Flonase )  Furosemide (Lasix)  Indomethacin  (Indocin )  Linaclotide  (Linzess )  Montelukast  (Singulair )  Multivitamin  Omeprazole (Prilosec)  Vitamin C  ==================================================================== For clinical consultation, please call [phone]. ====================================================================     No results found for: CBDTHCR No results found for: D8THCCBX No results found for: D9THCCBX  ROS  Constitutional: Denies any fever or chills Gastrointestinal:  No reported hemesis, hematochezia, vomiting, or acute GI distress Musculoskeletal: Denies any acute onset joint swelling, redness, loss of ROM, or weakness Neurological: No reported episodes of acute onset apraxia, aphasia, dysarthria, agnosia, amnesia, paralysis, loss of coordination, or loss of consciousness  Medication Review  Aloe Vera, B Complex Vitamins, EPINEPHrine , Insulin Pen Needle, Lifitegrast, acetaminophen , albuterol, anastrozole , ascorbic acid , azelastine , carvedilol , celecoxib , cetirizine , colchicine , desonide , diphenhydrAMINE, exenatide, fluticasone , furosemide, gabapentin , hydrOXYzine , irbesartan , levocetirizine, linaclotide , metoprolol  succinate, montelukast , multivitamin-lutein, omeprazole, pimecrolimus , rosuvastatin , tiZANidine , and traZODone   History Review  Allergy : Heather Mcintyre is allergic to penicillin g, cephalexin, etodolac, iodinated contrast media, latex, fish-derived products, hydrochlorothiazide , iodine, penicillins, shellfish allergy , and lisinopril. Drug: Heather Mcintyre  reports no history of drug use. Alcohol:  reports no history of alcohol use. Tobacco:  reports that she has never smoked. She has never  been exposed to tobacco smoke. She has never used smokeless tobacco. Social: Heather Mcintyre  reports that she has never smoked. She has never been exposed to tobacco smoke. She has never used smokeless tobacco. She reports that she does not drink alcohol and does not use drugs. Medical:  has a past medical history of Anxiety, Asthma, Breast cancer (HCC), Cervical cancer (HCC), Complication of anesthesia, Degenerative joint disease/osteoarthritis, Depression, GERD (gastroesophageal reflux disease), Hypertension, Lupus, and Pre-diabetes. Surgical: Heather Mcintyre  has a past surgical history that includes Abdominal hysterectomy; Replacement total knee bilateral; Cholecystectomy (N/A); Laparoscopic gastric band removal with laparoscopic gastric sleeve resection; Tubal ligation;  Breast biopsy (Left, 10/04/2022); Joint replacement; Colonoscopy with esophagogastroduodenoscopy (egd); Breast lumpectomy,radio freq localizer,axillary sentinel lymph node biopsy (Left, 11/07/2022); and Incision and drainage abscess (Left, 04/11/2023). Family: family history includes Hypertension in her father and mother.  Laboratory Chemistry Profile   Renal Lab Results  Component Value Date   BUN 19 03/20/2024   CREATININE 0.81 03/20/2024   BCR 23 03/20/2024   GFRAA >60 02/10/2017   GFRNONAA >60 12/27/2023    Hepatic Lab Results  Component Value Date   AST 21 03/20/2024   ALT 15 03/20/2024   ALBUMIN  3.9 03/20/2024   ALKPHOS 83 03/20/2024    Electrolytes Lab Results  Component Value Date   NA 142 03/20/2024   K 4.3 03/20/2024   CL 105 03/20/2024   CALCIUM  9.7 03/20/2024   MG 1.8 01/09/2024    Bone Lab Results  Component Value Date   25OHVITD1 26 (L) 01/09/2024   25OHVITD2 <1.0 01/09/2024   25OHVITD3 26 01/09/2024    Inflammation (CRP: Acute Phase) (ESR: Chronic Phase) Lab Results  Component Value Date   CRP 2 01/09/2024   ESRSEDRATE 31 01/09/2024         Note: Above Lab results reviewed.  Recent Imaging Review  CT KNEE RIGHT WO CONTRAST CLINICAL DATA:  Right knee pain and swelling after fall on 03/10/2024  EXAM: CT OF THE RIGHT KNEE WITHOUT CONTRAST  TECHNIQUE: Multidetector CT imaging of the right knee was performed according to the standard protocol. Multiplanar CT image reconstructions were also generated.  RADIATION DOSE REDUCTION: This exam was performed according to the departmental dose-optimization program which includes automated exposure control, adjustment of the mA and/or kV according to patient size and/or use of iterative reconstruction technique.  COMPARISON:  X-ray 12/17/2023  FINDINGS: Bones/Joint/Cartilage  Postsurgical changes from right total knee arthroplasty. Arthroplasty components are in their expected alignment.  No periprosthetic lucency or fracture. Moderate-large knee joint effusion. Calcification in the suprapatellar Mcintyre may be related to loose bodies or capsular calcification. Moderate-sized Baker's cyst. No erosive or destructive changes.  Ligaments  Suboptimally assessed by CT.  Muscles and Tendons  Musculotendinous structures grossly unremarkable by CT.  Soft tissues  Mild subcutaneous edema, most pronounced in the prepatellar region. No organized fluid collections.  IMPRESSION: 1. Postsurgical changes from right total knee arthroplasty without evidence of hardware complication. 2. Moderate-large knee joint effusion. 3. Moderate-sized Baker's cyst. 4. Mild subcutaneous edema, most pronounced in the prepatellar region.  Electronically Signed   By: Mabel Converse D.O.   On: 03/27/2024 15:33 Note: Reviewed        Physical Exam  Vitals: There were no vitals taken for this visit. BMI: Estimated body mass index is 41.66 kg/m as calculated from the following:   Height as of 05/27/24: 5' 8 (1.727 m).   Weight as of 05/29/24: 274 lb (124.3 kg). Ideal: Ideal body  weight: 63.9 kg (140 lb 14 oz) Adjusted ideal body weight: 88.1 kg (194 lb 2 oz) General appearance: Well nourished, well developed, and well hydrated. In no apparent acute distress Mental status: Alert, oriented x 3 (person, place, & time)       Respiratory: No evidence of acute respiratory distress Eyes: PERLA   Assessment   Diagnosis Status  1. Chronic low back pain (1ry area of Pain) (Bilateral) (L>R) w/o sciatica   2. 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 Lumbar facet joint pain   4. Lumbar facet hypertrophy (Multilevel) (Bilateral)   5. Lumbosacral facet arthropathy   6. Osteoarthritis of facet joint of lumbar spine   7. Lumbar facet joint syndrome   8. Spondylosis without myelopathy or radiculopathy, lumbosacral region    Controlled Controlled Controlled   Updated Problems: No  problems updated.  Plan of Care  Problem-specific:  Assessment and Plan            Heather Mcintyre has a current medication list which includes the following long-term medication(s): azelastine , byetta 5 mcg pen, carvedilol , cetirizine , diphenhydramine, fluticasone , furosemide, gabapentin , irbesartan , levocetirizine, linaclotide , montelukast , omeprazole, rosuvastatin , trazodone , and ventolin hfa.  Pharmacotherapy (Medications Ordered): No orders of the defined types were placed in this encounter.  Orders:  No orders of the defined types were placed in this encounter.    Interventional Therapies  Risk Factors  Considerations  Medical Comorbidities:  ALLERGY : Contrast, Latex, Iodine, NSAIDS, PCN  MO (BMI>40)  GERD  HTN  Hx. Bradycardia     Planned  Pending:   Diagnostic bilateral lumbar facet MBB #1    Under consideration:   Diagnostic bilateral lumbar facet MBB #1    Completed:   None at this time   Therapeutic  Palliative (PRN) options:   None established   Completed by other providers:   None reported     No follow-ups on file.    Recent Visits No visits were found meeting these conditions. Showing recent visits within past 90 days and meeting all other requirements Future Appointments Date Type Provider Dept  06/09/24 Appointment Tanya Glisson, MD [facility]  Showing future appointments within next 90 days and meeting all other requirements  I discussed the assessment and treatment plan with the patient. The patient was provided an opportunity to ask questions and all were answered. The patient agreed with the plan and demonstrated an understanding of the instructions.  Patient advised to call back or seek an in-person evaluation if the symptoms or condition worsens.  Duration of encounter: *** minutes.  Total time on encounter, as per AMA guidelines included both the face-to-face and non-face-to-face time personally spent by the  physician and/or other qualified health care professional(s) on the day of the encounter (includes time in activities that require the physician or other qualified health care professional and does not include time in activities normally performed by clinical staff). Physician's time may include the following activities when performed: Preparing to see the patient (e.g., pre-charting review of records, searching for previously ordered imaging, lab work, and nerve conduction tests) Review of prior analgesic pharmacotherapies. Reviewing PMP Interpreting ordered tests (e.g., lab work, imaging, nerve conduction tests) Performing post-procedure evaluations, including interpretation of diagnostic procedures Obtaining and/or reviewing separately obtained history Performing a medically appropriate examination and/or evaluation Counseling and educating the patient/family/caregiver Ordering medications, tests, or procedures Referring and communicating with other health care professionals (when not separately reported) Documenting clinical information in the electronic  or other health record Independently interpreting results (not separately reported) and communicating results to the patient/ family/caregiver Care coordination (not separately reported)  Note by: Eric DELENA Como, MD (TTS and AI technology used. I apologize for any typographical errors that were not detected and corrected.) Date: 06/09/2024; Time: 6:54 PM

## 2024-06-08 NOTE — Patient Instructions (Incomplete)

## 2024-06-09 ENCOUNTER — Ambulatory Visit: Attending: Pain Medicine | Admitting: Pain Medicine

## 2024-06-09 VITALS — BP 145/79 | HR 81 | Temp 96.6°F | Resp 16 | Ht 68.0 in | Wt 280.0 lb

## 2024-06-09 DIAGNOSIS — M47816 Spondylosis without myelopathy or radiculopathy, lumbar region: Secondary | ICD-10-CM | POA: Insufficient documentation

## 2024-06-09 DIAGNOSIS — M5459 Other low back pain: Secondary | ICD-10-CM | POA: Diagnosis not present

## 2024-06-09 DIAGNOSIS — M545 Low back pain, unspecified: Secondary | ICD-10-CM | POA: Insufficient documentation

## 2024-06-09 DIAGNOSIS — G8929 Other chronic pain: Secondary | ICD-10-CM | POA: Insufficient documentation

## 2024-06-09 DIAGNOSIS — M4316 Spondylolisthesis, lumbar region: Secondary | ICD-10-CM | POA: Diagnosis not present

## 2024-06-09 DIAGNOSIS — M47817 Spondylosis without myelopathy or radiculopathy, lumbosacral region: Secondary | ICD-10-CM | POA: Insufficient documentation

## 2024-06-09 NOTE — Progress Notes (Signed)
 Safety precautions to be maintained throughout the outpatient stay will include: orient to surroundings, keep bed in low position, maintain call bell within reach at all times, provide assistance with transfer out of bed and ambulation.

## 2024-06-12 ENCOUNTER — Institutional Professional Consult (permissible substitution) (HOSPITAL_BASED_OUTPATIENT_CLINIC_OR_DEPARTMENT_OTHER): Admitting: Family

## 2024-06-24 ENCOUNTER — Ambulatory Visit (HOSPITAL_BASED_OUTPATIENT_CLINIC_OR_DEPARTMENT_OTHER): Admitting: Pain Medicine

## 2024-06-24 ENCOUNTER — Ambulatory Visit
Admission: RE | Admit: 2024-06-24 | Discharge: 2024-06-24 | Disposition: A | Discharge status: Home / Self Care | Source: Ambulatory Visit | Attending: Pain Medicine | Admitting: Pain Medicine

## 2024-06-24 ENCOUNTER — Encounter: Payer: Self-pay | Admitting: Pain Medicine

## 2024-06-24 VITALS — BP 155/88 | HR 62 | Temp 97.9°F | Resp 16 | Ht 68.0 in | Wt 274.0 lb

## 2024-06-24 DIAGNOSIS — M47817 Spondylosis without myelopathy or radiculopathy, lumbosacral region: Secondary | ICD-10-CM

## 2024-06-24 DIAGNOSIS — M545 Low back pain, unspecified: Secondary | ICD-10-CM | POA: Diagnosis not present

## 2024-06-24 DIAGNOSIS — Z9189 Other specified personal risk factors, not elsewhere classified: Secondary | ICD-10-CM | POA: Insufficient documentation

## 2024-06-24 DIAGNOSIS — M4316 Spondylolisthesis, lumbar region: Secondary | ICD-10-CM | POA: Diagnosis present

## 2024-06-24 DIAGNOSIS — Z91040 Latex allergy status: Secondary | ICD-10-CM | POA: Insufficient documentation

## 2024-06-24 DIAGNOSIS — M47816 Spondylosis without myelopathy or radiculopathy, lumbar region: Secondary | ICD-10-CM | POA: Diagnosis present

## 2024-06-24 DIAGNOSIS — G8929 Other chronic pain: Secondary | ICD-10-CM

## 2024-06-24 DIAGNOSIS — Z91041 Radiographic dye allergy status: Secondary | ICD-10-CM | POA: Diagnosis present

## 2024-06-24 DIAGNOSIS — M5459 Other low back pain: Secondary | ICD-10-CM

## 2024-06-24 DIAGNOSIS — Z888 Allergy status to other drugs, medicaments and biological substances status: Secondary | ICD-10-CM | POA: Insufficient documentation

## 2024-06-24 MED ORDER — TRIAMCINOLONE ACETONIDE 40 MG/ML IJ SUSP
40.0000 mg | Freq: Once | INTRAMUSCULAR | Status: AC
  Administered 2024-06-24: 40 mg
  Filled 2024-06-24: qty 1

## 2024-06-24 MED ORDER — ROPIVACAINE HCL 2 MG/ML IJ SOLN
9.0000 mL | Freq: Once | INTRAMUSCULAR | Status: AC
  Administered 2024-06-24: 9 mL via PERINEURAL
  Filled 2024-06-24: qty 20

## 2024-06-24 MED ORDER — MIDAZOLAM HCL 2 MG/2ML IJ SOLN
0.5000 mg | Freq: Once | INTRAMUSCULAR | Status: AC
  Administered 2024-06-24: 2 mg via INTRAVENOUS
  Filled 2024-06-24: qty 2

## 2024-06-24 MED ORDER — PENTAFLUOROPROP-TETRAFLUOROETH EX AERO
INHALATION_SPRAY | Freq: Once | CUTANEOUS | Status: AC
  Administered 2024-06-24: 30 via TOPICAL

## 2024-06-24 MED ORDER — LIDOCAINE HCL 2 % IJ SOLN
20.0000 mL | Freq: Once | INTRAMUSCULAR | Status: AC
  Administered 2024-06-24: 400 mg
  Filled 2024-06-24: qty 20

## 2024-06-24 NOTE — Progress Notes (Signed)
 PROVIDER NOTE: Interpretation of information contained herein should be left to medically-trained personnel. Specific patient instructions are provided elsewhere under Patient Instructions section of medical record. This document was created in part using STT-dictation technology, any transcriptional errors that may result from this process are unintentional.  Patient: Heather Mcintyre Type: Established DOB: December 14, 1965 MRN: [id number] PCP: Lorel Maxie LABOR, MD  Service: Procedure DOS: 06/24/2024 Setting: Ambulatory Location: Ambulatory outpatient facility Delivery: Face-to-face Provider: Eric LABOR Como, MD Specialty: Interventional Pain Management Specialty designation: 09 Location: Outpatient facility Ref. Prov.: Como Eric, MD       Interventional Therapy   Type: Lumbar Facet, Medial Branch Block(s) (w/ fluoroscopic mapping) #1  Laterality: Left  Level: L2, L3, L4, L5, and S1 Medial Branch/Dorsal Rami Level(s). Injecting these levels blocks the L3-4, L4-5, and L5-S1 lumbar facet joints. Imaging: Fluoroscopic guidance Spinal ([id number]) Anesthesia: Local anesthesia (1-2% Lidocaine ) Anxiolysis: IV Versed  2.0 mg Sedation: No Sedation                       DOS: 06/24/2024 Performed by: Eric LABOR Como, MD  Primary Purpose: Diagnostic/Therapeutic Indications: Low back pain severe enough to impact quality of life or function. 1. Chronic low back pain (1ry area of Pain) (Bilateral) (L>R) w/o sciatica   2. 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 Lumbar facet joint pain   4. Lumbar facet hypertrophy (Multilevel) (Bilateral)   5. Lumbosacral facet arthropathy   6. Osteoarthritis of facet joint of lumbar spine   7. Spondylosis without myelopathy or radiculopathy, lumbosacral region   8. Latex precautions, history of latex allergy    9. At high risk for allergic reaction to latex   10. History of allergy  to latex   11. History of allergy  to radiographic contrast  media   12. Allergy  to iodine    NAS-11 Pain score:   Pre-procedure: 8 /10   Post-procedure: 5 /10     Position / Prep / Materials:  Position: Prone  Prep solution: ChloraPrep (2% chlorhexidine  gluconate and 70% isopropyl alcohol) Area Prepped: Posterolateral Lumbosacral Spine (Wide prep: From the lower border of the scapula down to the end of the tailbone and from flank to flank.)  Materials:  Tray: Block Needle(s):  Type: Spinal  Gauge (G): 22  Length: 5-in Qty: 4     H&P (Pre-op Assessment):  Heather Mcintyre is a 58 y.o. (year old), female patient, seen today for interventional treatment. 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 Heather Mcintyre has a current medication list which includes the following prescription(s): acetaminophen , aloe vera, anastrozole , ascorbic acid , azelastine , b complex vitamins, carvedilol , celecoxib , cetirizine , colchicine , desonide , diphenhydramine, epinephrine , fluticasone , furosemide, gabapentin , global ease inject pen needles, hydroxyzine , irbesartan , levocetirizine, linaclotide , montelukast , multivitamin-lutein, omeprazole, pimecrolimus , rosuvastatin , tizanidine , trazodone , ventolin hfa, xiidra, and [DISCONTINUED] metoprolol  succinate. Her primarily concern today is the Back Pain and Hip Pain (left)  Initial Vital Signs:  Pulse/HCG Rate: 62ECG Heart Rate: 68 Temp: 97.9 F (36.6 C) Resp: 20 BP: 135/80 SpO2: 100 %  BMI: Estimated body mass index is 41.66 kg/m as calculated from the following:   Height as of this encounter: 5' 8  (1.727 m).   Weight as of this encounter:  274 lb (124.3 kg).  Risk Assessment: Allergies: Reviewed. She is allergic to penicillin g, cephalexin, etodolac, iodinated contrast media, latex, fish-derived products, hydrochlorothiazide , iodine, penicillins, shellfish allergy , and lisinopril.  Allergy  Precautions: None required Coagulopathies: Reviewed. None identified.  Blood-thinner therapy: None at this time Active Infection(s): Reviewed. None identified. Heather Mcintyre is afebrile  Site Confirmation: Heather Mcintyre was asked to confirm the procedure and laterality before marking the site Procedure checklist: Completed Consent: Before the procedure and under the influence of no sedative(s), amnesic(s), or anxiolytics, the patient was informed of the treatment options, risks and possible complications. To fulfill our ethical and legal obligations, as recommended by the American Medical Association's Code of Ethics, I have informed the patient of my clinical impression; the nature and purpose of the treatment or procedure; the risks, benefits, and possible complications of the intervention; the alternatives, including doing nothing; the risk(s) and benefit(s) of the alternative treatment(s) or procedure(s); and the risk(s) and benefit(s) of doing nothing. The patient was provided information about the general risks and possible complications associated with the procedure. These may include, but are not limited to: failure to achieve desired goals, infection, bleeding, organ or nerve damage, allergic reactions, paralysis, and death. In addition, the patient was informed of those risks and complications associated to Spine-related procedures, such as failure to decrease pain; infection (i.e.: Meningitis, epidural or intraspinal abscess); bleeding (i.e.: epidural hematoma, subarachnoid hemorrhage, or any other type of intraspinal or peri-dural bleeding); organ or nerve damage (i.e.: Any type of peripheral nerve,  nerve root, or spinal cord injury) with subsequent damage to sensory, motor, and/or autonomic systems, resulting in permanent pain, numbness, and/or weakness of one or several areas of the body; allergic reactions; (i.e.: anaphylactic reaction); and/or death. Furthermore, the patient was informed of those risks and complications associated with the medications. These include, but are not limited to: allergic reactions (i.e.: anaphylactic or anaphylactoid reaction(s)); adrenal axis suppression; blood sugar elevation that in diabetics may result in ketoacidosis or comma; water  retention that in patients with history of congestive heart failure may result in shortness of breath, pulmonary edema, and decompensation with resultant heart failure; weight gain; swelling or edema; medication-induced neural toxicity; particulate matter embolism and blood vessel occlusion with resultant organ, and/or nervous system infarction; and/or aseptic necrosis of one or more joints. Finally, the patient was informed that Medicine is not an exact science; therefore, there is also the possibility of unforeseen or unpredictable risks and/or possible complications that may result in a catastrophic outcome. The patient indicated having understood very clearly. We have given the patient no guarantees and we have made no promises. Enough time was given to the patient to ask questions, all of which were answered to the patient's satisfaction. Ms. Rockers has indicated that she wanted to continue with the procedure. Attestation: I, the ordering provider, attest that I have discussed with the patient the benefits, risks, side-effects, alternatives, likelihood of achieving goals, and potential problems during recovery for the procedure that I have provided informed consent. Date  Time: 06/24/2024 10:21 AM  Pre-Procedure Preparation:  Monitoring: As per clinic protocol. Respiration, ETCO2, SpO2, BP, heart rate and rhythm monitor placed and  checked for adequate function Safety Precautions: Patient was assessed for positional comfort and pressure points before starting the procedure. Time-out: I initiated and conducted the Time-out before starting the procedure, as per protocol. The patient was asked to participate by confirming the accuracy of the Time Out information. Verification of the correct person, site, and procedure  were performed and confirmed by me, the nursing staff, and the patient. Time-out conducted as per Joint Commission's Universal Protocol (UP.01.01.01). Time: 1115 Start Time: 1115 hrs.  Description of Procedure:          Laterality: (see above) Targeted Levels: (see above)  Safety Precautions: Aspiration looking for blood return was conducted prior to all injections. At no point did we inject any substances, as a needle was being advanced. Before injecting, the patient was told to immediately notify me if she was experiencing any new onset of ringing in the ears, or metallic taste in the mouth. No attempts were made at seeking any paresthesias. Safe injection practices and needle disposal techniques used. Medications properly checked for expiration dates. SDV (single dose vial) medications used. After the completion of the procedure, all disposable equipment used was discarded in the proper designated medical waste containers. Local Anesthesia: Protocol guidelines were followed. The patient was positioned over the fluoroscopy table. The area was prepped in the usual manner. The time-out was completed. The target area was identified using fluoroscopy. A 12-in long, straight, sterile hemostat was used with fluoroscopic guidance to locate the targets for each level blocked. Once located, the skin was marked with an approved surgical skin marker. Once all sites were marked, the skin (epidermis, dermis, and hypodermis), as well as deeper tissues (fat, connective tissue and muscle) were infiltrated with a small amount of  a short-acting local anesthetic, loaded on a 10cc syringe with a 25G, 1.5-in  Needle. An appropriate amount of time was allowed for local anesthetics to take effect before proceeding to the next step. Local Anesthetic: Lidocaine  2.0% The unused portion of the local anesthetic was discarded in the proper designated containers. Technical description of process:  Medial Branch  Dorsal Rami Nerve Block (MBB):  Neuroanatomy note: Each lumbar facet joint receives dual innervation from medial branches arising from the posterior primary rami at the same level and one level above. The target for each lumbar medial branch is the junction of the ipsilateral superior articular and transverse process of the lower vertebral body. (i.e.: The L4-L5 facet joint is innervated by the L4 medial branch [located at L5] and the L3 medial branch [located at L4]. Blocking the L4 Medial Branch is therefore achieved by injecting at the junction of the ipsilateral superior articular and transverse process of the lower vertebral body [L5].).  Exception: The exception to the above rule is the L5-S1 facet joint which has triple innervation requiring the L4 medial branch, as well as the L5 and the S1 Dorsal Rami(s) to be blocked to fully denervate the joint.  Under fluoroscopic guidance, a needle was inserted until contact was made with os over the target area. After negative aspiration, 0.5 mL of the nerve block solution was injected without difficulty or complication. Paresthesia were avoided during injection. The needle(s) were removed intact and without complication.  Once the entire procedure was completed, the treated area was cleaned, making sure to leave some of the prepping solution back to take advantage of its long term bactericidal properties.         Illustration of the posterior view of the lumbar spine and the posterior neural structures. Laminae of L2 through S1 are labeled. DPRL5, dorsal primary ramus of L5; DPRS1,  dorsal primary ramus of S1; DPR3, dorsal primary ramus of L3; FJ, facet (zygapophyseal) joint L3-L4; I, inferior articular process of L4; LB1, lateral branch of dorsal primary ramus of L1; IAB, inferior articular branches from L3  medial branch (supplies L4-L5 facet joint); IBP, intermediate branch plexus; MB3, medial branch of dorsal primary ramus of L3; NR3, third lumbar nerve root; S, superior articular process of L5; SAB, superior articular branches from L4 (supplies L4-5 facet joint also); TP3, transverse process of L3.   Facet Joint Innervation (* possible contribution)  L1-2 T12, L1 (L2*)  Medial Branch  L2-3 L1, L2 (L3*)                     L3-4 L2, L3 (L4*)                     L4-5 L3, L4 (L5*)                     L5-S1 L4, L5, S1                        Vitals:   06/24/24 1115 06/24/24 1120 06/24/24 1125 06/24/24 1133  BP: (!) 151/81 (!) 145/82 135/77 (!) 155/88  Pulse:      Resp: 17 17 20 16   Temp:      SpO2: 98% 99% 99% 99%  Weight:      Height:         End Time: 1124 hrs.  Imaging Guidance (Spinal):         Type of Imaging Technique: Fluoroscopy Guidance (Spinal) Indication(s): Fluoroscopy guidance for needle placement to enhance accuracy in procedures requiring precise needle localization for targeted delivery of medication in or near specific anatomical locations not easily accessible without such real-time imaging assistance. Exposure Time: Please see nurses notes. Contrast: None used. Fluoroscopic Guidance: I was personally present during the use of fluoroscopy. Tunnel Vision Technique used to obtain the best possible view of the target area. Parallax error corrected before commencing the procedure. Direction-depth-direction technique used to introduce the needle under continuous pulsed fluoroscopy. Once target was reached, antero-posterior, oblique, and lateral fluoroscopic projection used confirm needle placement in all planes. Images permanently stored in  EMR. Interpretation: No contrast injected. I personally interpreted the imaging intraoperatively. Adequate needle placement confirmed in multiple planes. Permanent images saved into the patient's record.  Post-operative Assessment:  Post-procedure Vital Signs:  Pulse/HCG Rate: 6265 Temp: 97.9 F (36.6 C) Resp: 16 BP: (!) 155/88 SpO2: 99 %  EBL: None  Complications: No immediate post-treatment complications observed by team, or reported by patient.  Note: The patient tolerated the entire procedure well. A repeat set of vitals were taken after the procedure and the patient was kept under observation following institutional policy, for this type of procedure. Post-procedural neurological assessment was performed, showing return to baseline, prior to discharge. The patient was provided with post-procedure discharge instructions, including a section on how to identify potential problems. Should any problems arise concerning this procedure, the patient was given instructions to immediately contact us , at any time, without hesitation. In any case, we plan to contact the patient by telephone for a follow-up status report regarding this interventional procedure.  Comments:  No additional relevant information.  Plan of Care (POC)  Orders:  Orders Placed This Encounter  Procedures   LUMBAR FACET(MEDIAL BRANCH NERVE BLOCK) MBNB    Scheduling Instructions:     Procedure: Lumbar facet block (AKA.: Lumbosacral medial branch nerve block)     Side: Left-sided     Level: L3-4, L4-5, L5-S1, and TBD Facets (L2, L3, L4, L5, S1, and TBD Medial Branch Nerves)  Sedation: Patient's choice.     Date: 06/24/2024    Where will this procedure be performed?:   [facility]   [facility] C-ARM 1-60 MIN NO REPORT    Intraoperative interpretation by procedural physician at [facility].    Standing Status:   Standing    Number of Occurrences:   1    Reason for exam::   Assistance in needle  guidance and placement for procedures requiring needle placement in or near specific anatomical locations not easily accessible without such assistance.   Informed Consent Details: Physician/Practitioner Attestation; Transcribe to consent form and obtain patient signature    Nursing Order: Transcribe to consent form and obtain patient signature. Note: Always confirm laterality of pain with Ms. Bostelman, before procedure.    Physician/Practitioner attestation of informed consent for procedure/surgical case:   I, the physician/practitioner, attest that I have discussed with the patient the benefits, risks, side effects, alternatives, likelihood of achieving goals and potential problems during recovery for the procedure that I have provided informed consent.    Procedure:   Lumbar Facet Block  under fluoroscopic guidance    Physician/Practitioner performing the procedure:   Chloey Ricard A. Tanya MD    Indication/Reason:   Low Back Pain, with our without leg pain, due to Facet Joint Arthralgia (Joint Pain) Spondylosis (Arthritis of the Spine), without myelopathy or radiculopathy (Nerve Damage).   Provide equipment / supplies at bedside    Procedure tray: Block Tray (Disposable  single use) Skin infiltration needle: Regular 1.5-in, 25-G, (x1) Block Needle type: Spinal Amount/quantity: 4 Size: Medium (5-inch) Gauge: 22G    Standing Status:   Standing    Number of Occurrences:   1    Specify:   Block Tray   Saline lock IV    Have LR [phone] mL available and administer at 125 mL/hr if patient becomes hypotensive.    Standing Status:   Standing    Number of Occurrences:   1   Miscellanous precautions    Standing Status:   Standing    Number of Occurrences:   1   Miscellanous precautions    NOTE: Although It is true that patients can have allergies to shellfish and that shellfish contain iodine, most shellfish  allergies are due to two protein allergens present in the shellfish: tropomyosins and  parvalbumin. Not all patients with shellfish allergies are allergic to iodine. However, as a precaution, avoid using iodine containing products.    Standing Status:   Standing    Number of Occurrences:   1   Latex precautions    Activate Latex-Free Protocol.    Standing Status:   Standing    Number of Occurrences:   1     Opioid analgesics:  no chronic opioid analgesics therapy prescribed by our practice. None. MME/day: 0 mg/day    Medications ordered for procedure: Meds ordered this encounter  Medications   lidocaine  (XYLOCAINE ) 2 % (with pres) injection 400 mg   pentafluoroprop-tetrafluoroeth (GEBAUERS) aerosol   ropivacaine  (PF) 2 mg/mL (0.2%) (NAROPIN ) injection 9 mL   triamcinolone  acetonide (KENALOG -40) injection 40 mg   midazolam  (VERSED ) injection 0.5-2 mg    Make sure Flumazenil is available in the pyxis when using this medication. If oversedation occurs, administer 0.2 mg IV over 15 sec. If after 45 sec no response, administer 0.2 mg again over 1 min; may repeat at 1 min intervals; not to exceed 4 doses (1 mg)   Medications administered: We administered lidocaine , pentafluoroprop-tetrafluoroeth,  ropivacaine  (PF) 2 mg/mL (0.2%), triamcinolone  acetonide, and midazolam .  See the medical record for exact dosing, route, and time of administration.    Interventional Therapies  Risk Factors  Considerations  Medical Comorbidities:  ALLERGY : Contrast, Latex, Iodine, NSAIDS, PCN  MO (BMI>40)  GERD  HTN  Hx. Bradycardia     Planned  Pending:   Diagnostic Left lumbar facet MBB #1    Under consideration:   Diagnostic bilateral lumbar facet MBB #1    Completed:   None at this time   Therapeutic  Palliative (PRN) options:   None established   Completed by other providers:   None reported      Follow-up plan:   Return in about 2 weeks (around 07/08/2024) for Eval-day (M,W), (Face2F), (PPE).     Recent Visits Date Type Provider Dept  06/09/24 Office Visit  Tanya Glisson, MD [facility]  Showing recent visits within past 90 days and meeting all other requirements Today's Visits Date Type Provider Dept  06/24/24 Procedure visit Tanya Glisson, MD [facility]  Showing today's visits and meeting all other requirements Future Appointments Date Type Provider Dept  07/07/24 Appointment Tanya Glisson, MD [facility]  Showing future appointments within next 90 days and meeting all other requirements   Disposition: Discharge home  Discharge (Date  Time): 06/24/2024; 1143 hrs.   Primary Care Physician: Lorel Maxie LABOR, MD Location: [facility] Note by: Glisson LABOR Tanya, MD (TTS technology used. I apologize for any typographical errors that were not detected and corrected.) Date: 06/24/2024; Time: 12:14 PM  Disclaimer:  Medicine is not an Visual merchandiser. The only guarantee in medicine is that nothing is guaranteed. It is important to note that the decision to proceed with this intervention was based on the information collected from the patient. The Data and conclusions were drawn from the patient's questionnaire, the interview, and the physical examination. Because the information was provided in large part by the patient, it cannot be guaranteed that it has not been purposely or unconsciously manipulated. Every effort has been made to obtain as much relevant data as possible for this evaluation. It is important to note that the conclusions that lead to this procedure are derived in large part from the available data. Always take into account that the treatment will also be dependent on availability of resources and existing treatment guidelines, considered by other Pain Management Practitioners as being common knowledge and practice, at the time of the intervention. For Medico-Legal purposes, it is also important to point out that variation in procedural techniques and pharmacological  choices are the acceptable norm. The indications, contraindications, technique, and results of the above procedure should only be interpreted and judged by a Board-Certified Interventional Pain Specialist with extensive familiarity and expertise in the same exact procedure and technique.

## 2024-06-24 NOTE — Progress Notes (Signed)
 Safety precautions to be maintained throughout the outpatient stay will include: orient to surroundings, keep bed in low position, maintain call bell within reach at all times, provide assistance with transfer out of bed and ambulation.

## 2024-06-24 NOTE — Patient Instructions (Signed)

## 2024-06-25 ENCOUNTER — Telehealth: Payer: Self-pay | Admitting: *Deleted

## 2024-06-25 NOTE — Telephone Encounter (Signed)
 Attempted to call for post procedure follow-up.l No answer, mailbox full.

## 2024-07-06 NOTE — Progress Notes (Signed)
 PROVIDER NOTE: Interpretation of information contained herein should be left to medically-trained personnel. Specific patient instructions are provided elsewhere under Patient Instructions section of medical record. This document was created in part using AI and STT-dictation technology, any transcriptional errors that may result from this process are unintentional.  Patient: Heather Mcintyre  Service: E/M   PCP: Lorel Maxie LABOR, MD  DOB: [birth year]  DOS: 07/07/2024  Provider: Eric LABOR Como, MD  MRN: [id number]  Delivery: Face-to-face  Specialty: Interventional Pain Management  Type: Established Patient  Setting: Ambulatory outpatient facility  Specialty designation: 09  Referring Prov.: Lorel Maxie LABOR, MD  Location: Outpatient office facility       History of present illness (HPI) Heather Mcintyre, a 58 y.o. year old female, is here today because of her Chronic bilateral low back pain without sciatica [M54.50, G89.29]. Heather Mcintyre primary complain today is No chief complaint on file.  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 [facility]); Malignant neoplasm of left breast in female, estrogen receptor positive (HCC); Spondylolisthesis, lumbar region; Arthropathy of lumbar facet joint; Lumbar spondylosis; Chronic joint pain; History of knee replacement (Bilateral); Chronic low back pain (1ry area of Pain) (Bilateral) (L>R) w/o sciatica; Osteoarthritis; Osteoarthritis of ankle; Pes planovalgus, acquired (Left); Pes planovalgus, acquired (Right); Radiculopathy of lumbar region; Sacroiliac joint pain; Pain in joint of shoulder (Left); Chronic pain syndrome; Abnormal MRI, lumbar spine (03/14/2019); Lumbar facet hypertrophy (Multilevel) (Bilateral); Lumbar foraminal stenosis (Multilevel) (Bilateral); Lumbosacral foraminal stenosis (Severe) (L5-S1) (Bilateral); 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  Pain Assessment: Severity of   is reported as a  /10. Location:    / . Onset:  . Quality:  . Timing:  . Modifying factor(s):  SABRA Vitals:  vitals were not taken for this visit.  BMI: Estimated body mass index is 41.66 kg/m as calculated from the following:   Height as of 06/24/24: 5' 8 (1.727 m).   Weight as of 06/24/24: 274 lb (124.3 kg).  Last encounter: 06/09/2024. Last procedure: 06/24/2024.  Reason for encounter: post-procedure evaluation and assessment.   Discussed the use of AI scribe software for clinical note transcription with the patient, who gave verbal consent to proceed.  History of Present Illness          Post-Procedure Evaluation   Type: Lumbar Facet, Medial Branch Block(s) (w/ fluoroscopic mapping) #1  Laterality: Left  Level: L2, L3, L4, L5, and S1 Medial Branch/Dorsal Rami Level(s). Injecting these levels blocks the L3-4, L4-5, and L5-S1 lumbar facet joints. Imaging: Fluoroscopic guidance Spinal ([id number]) Anesthesia: Local anesthesia (1-2% Lidocaine ) Anxiolysis: IV Versed  2.0 mg Sedation: No Sedation                       DOS: 06/24/2024 Performed by: Eric LABOR Como, MD  Primary Purpose: Diagnostic/Therapeutic Indications: Low back pain severe enough to impact quality of life or function. 1. Chronic low back pain (1ry area of Pain) (Bilateral) (L>R) w/o sciatica   2. Grade 1 Anterolisthesis of lumbar  spine (L4/L5)   3. Lumbar facet joint pain   4. Lumbar facet hypertrophy (Multilevel)  (Bilateral)   5. Lumbosacral facet arthropathy   6. Osteoarthritis of facet joint of lumbar spine   7. Spondylosis without myelopathy or radiculopathy, lumbosacral region   8. Latex precautions, history of latex allergy    9. At high risk for allergic reaction to latex   10. History of allergy  to latex   11. History of allergy  to radiographic contrast media   12. Allergy  to iodine    NAS-11 Pain score:   Pre-procedure: 8 /10   Post-procedure: 5 /10     Effectiveness:  Initial hour after procedure:   ***. Subsequent 4-6 hours post-procedure:   ***. Analgesia past initial 6 hours:   ***. Ongoing improvement:  Analgesic:  *** Function:    ***    ROM:    ***     Pharmacotherapy Assessment   Opioid analgesics:  no chronic opioid analgesics therapy prescribed by our practice. None. MME/day: 0 mg/day   Monitoring: [place] PMP: PDMP reviewed during this encounter.       Pharmacotherapy: No side-effects or adverse reactions reported. Compliance: No problems identified. Effectiveness: Clinically acceptable.  No notes on file  UDS:  Summary  Date Value Ref Range Status  12/17/2023 FINAL  Final    Comment:    ==================================================================== Compliance Drug Analysis, Ur ==================================================================== Test                             Result       Flag       Units  Drug Present and Declared for Prescription Verification   Gabapentin                      PRESENT      EXPECTED   Acetaminophen                   PRESENT      EXPECTED   Diphenhydramine                PRESENT      EXPECTED   Hydroxyzine                     PRESENT      EXPECTED   Metoprolol                      PRESENT      EXPECTED  Drug Absent but Declared for Prescription Verification   Tizanidine                      Not Detected UNEXPECTED    Tizanidine , as indicated in the declared medication list, is not    always detected even when used as  directed.  ==================================================================== Test                      Result    Flag   Units      Ref Range   Creatinine              145              mg/dL      >=79 ==================================================================== Declared Medications:  The flagging and interpretation on this report are based on the  following declared medications.  Unexpected results may arise from  inaccuracies in the declared medications.   **Note: The  testing scope of this panel includes these medications:   Diphenhydramine (Benadryl)  Gabapentin   Hydroxyzine  (Atarax )  Metoprolol  (Toprol )   **Note: The testing scope of this panel does not include small to  moderate amounts of these reported medications:   Acetaminophen   Tizanidine  (Zanaflex )   **Note: The testing scope of this panel does not include the  following reported medications:   Aloe Vera  Anastrozole  (Arimidex )  Celecoxib  (Celebrex )  Cetirizine  (Zyrtec )  Colchicine   Fluticasone  (Flonase )  Furosemide (Lasix)  Indomethacin  (Indocin )  Linaclotide  (Linzess )  Montelukast  (Singulair )  Multivitamin  Omeprazole (Prilosec)  Vitamin C  ==================================================================== For clinical consultation, please call [phone]. ====================================================================     No results found for: CBDTHCR No results found for: D8THCCBX No results found for: D9THCCBX  ROS  Constitutional: Denies any fever or chills Gastrointestinal: No reported hemesis, hematochezia, vomiting, or acute GI distress Musculoskeletal: Denies any acute onset joint swelling, redness, loss of ROM, or weakness Neurological: No reported episodes of acute onset apraxia, aphasia, dysarthria, agnosia, amnesia, paralysis, loss of coordination, or loss of consciousness  Medication Review  Aloe Vera, B Complex Vitamins, EPINEPHrine , Insulin Pen Needle,  Lifitegrast, acetaminophen , albuterol, anastrozole , ascorbic acid , azelastine , carvedilol , celecoxib , cetirizine , colchicine , desonide , diphenhydrAMINE, fluticasone , furosemide, gabapentin , hydrOXYzine , irbesartan , levocetirizine, linaclotide , metoprolol  succinate, montelukast , multivitamin-lutein, omeprazole, pimecrolimus , rosuvastatin , tiZANidine , and traZODone   History Review  Allergy : Heather Mcintyre is allergic to penicillin g, cephalexin, etodolac, iodinated contrast media, latex, fish-derived products, hydrochlorothiazide , iodine, penicillins, shellfish allergy , and lisinopril. Drug: Heather Mcintyre  reports no history of drug use. Alcohol:  reports no history of alcohol use. Tobacco:  reports that she has never smoked. She has never been exposed to tobacco smoke. She has never used smokeless tobacco. Social: Heather Mcintyre  reports that she has never smoked. She has never been exposed to tobacco smoke. She has never used smokeless tobacco. She reports that she does not drink alcohol and does not use drugs. Medical:  has a past medical history of Anxiety, Asthma, Breast cancer (HCC), Cervical cancer (HCC), Complication of anesthesia, Degenerative joint disease/osteoarthritis, Depression, GERD (gastroesophageal reflux disease), Hypertension, Lupus, and Pre-diabetes. 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 Family: family history includes Hypertension in her father and mother.  Laboratory Chemistry Profile   Renal Lab Results  Component Value Date   BUN 19  03/20/2024   CREATININE 0.81 03/20/2024   BCR 23 03/20/2024   GFRAA >60 02/10/2017   GFRNONAA >60 12/27/2023    Hepatic Lab Results  Component Value Date   AST 21 03/20/2024   ALT 15 03/20/2024   ALBUMIN  3.9 03/20/2024   ALKPHOS 83 03/20/2024    Electrolytes Lab Results  Component Value Date   NA 142 03/20/2024   K 4.3 03/20/2024   CL 105 03/20/2024   CALCIUM  9.7 03/20/2024   MG 1.8 01/09/2024    Bone Lab Results  Component Value Date   25OHVITD1 26 (L) 01/09/2024   25OHVITD2 <1.0 01/09/2024   25OHVITD3 26 01/09/2024    Inflammation (CRP: Acute Phase) (ESR: Chronic Phase) Lab Results  Component Value Date   CRP 2 01/09/2024   ESRSEDRATE 31 01/09/2024         Note: Above Lab results reviewed.  Recent Imaging  Review  DG PAIN CLINIC C-ARM 1-60 MIN NO REPORT Fluoro was used, but no Radiologist interpretation will be provided.  Please refer to NOTES tab for provider progress note. Note: Reviewed        Physical Exam  Vitals: There were no vitals taken for this visit. BMI: Estimated body mass index is 41.66 kg/m as calculated from the following:   Height as of 06/24/24: 5' 8 (1.727 m).   Weight as of 06/24/24: 274 lb (124.3 kg). Ideal: Ideal body weight: 63.9 kg (140 lb 14 oz) Adjusted ideal body weight: 88.1 kg (194 lb 2 oz) General appearance: Well nourished, well developed, and well hydrated. In no apparent acute distress Mental status: Alert, oriented x 3 (person, place, & time)       Respiratory: No evidence of acute respiratory distress Eyes: PERLA   Assessment   Diagnosis Status  1. Chronic low back pain (1ry area of Pain) (Bilateral) (L>R) w/o sciatica   2. Lumbar facet joint pain   3. Osteoarthritis of facet joint of lumbar spine   4. Acute postoperative pain    Controlled Controlled Controlled   Updated Problems: No problems updated.  Plan of Care  Problem-specific:  Assessment and Plan            Heather Mcintyre has a current  medication list which includes the following long-term medication(s): azelastine , carvedilol , cetirizine , diphenhydramine, fluticasone , furosemide, gabapentin , irbesartan , levocetirizine, linaclotide , montelukast , omeprazole, rosuvastatin , trazodone , and ventolin hfa.  Pharmacotherapy (Medications Ordered): No orders of the defined types were placed in this encounter.  Orders:  No orders of the defined types were placed in this encounter.    Interventional Therapies  Risk Factors  Considerations  Medical Comorbidities:  ALLERGY : Contrast, Latex, Iodine, NSAIDS, PCN  MO (BMI>40)  GERD  HTN  Hx. Bradycardia     Planned  Pending:   Diagnostic Left lumbar facet MBB #1    Under consideration:   Diagnostic bilateral lumbar facet MBB #1    Completed:   None at this time   Therapeutic  Palliative (PRN) options:   None established   Completed by other providers:   None reported     No follow-ups on file.    Recent Visits Date Type Provider Dept  06/24/24 Procedure visit Tanya Glisson, MD [facility]  06/09/24 Office Visit Tanya Glisson, MD [facility]  Showing recent visits within past 90 days and meeting all other requirements Future Appointments Date Type Provider Dept  07/07/24 Appointment Tanya Glisson, MD [facility]  Showing future appointments within next 90 days and meeting all other requirements  I discussed the assessment and treatment plan with the patient. The patient was provided an opportunity to ask questions and all were answered. The patient agreed with the plan and demonstrated an understanding of the instructions.  Patient advised to call back or seek an in-person evaluation if the symptoms or condition worsens.  Duration of encounter: *** minutes.  Total time on encounter, as per AMA guidelines included both the face-to-face and non-face-to-face time personally spent by the physician and/or other qualified  health care professional(s) on the day of the encounter (includes time in activities that require the physician or other qualified health care professional and does not include time in activities normally performed by clinical staff). Physician's time may include the following activities when performed: Preparing to see the patient (e.g., pre-charting review of records, searching for previously ordered imaging, lab work, and nerve conduction tests)  Review of prior analgesic pharmacotherapies. Reviewing PMP Interpreting ordered tests (e.g., lab work, imaging, nerve conduction tests) Performing post-procedure evaluations, including interpretation of diagnostic procedures Obtaining and/or reviewing separately obtained history Performing a medically appropriate examination and/or evaluation Counseling and educating the patient/family/caregiver Ordering medications, tests, or procedures Referring and communicating with other health care professionals (when not separately reported) Documenting clinical information in the electronic or other health record Independently interpreting results (not separately reported) and communicating results to the patient/ family/caregiver Care coordination (not separately reported)  Note by: Eric DELENA Como, MD (TTS and AI technology used. I apologize for any typographical errors that were not detected and corrected.) Date: 07/07/2024; Time: 2:42 PM

## 2024-07-07 ENCOUNTER — Ambulatory Visit: Attending: Pain Medicine | Admitting: Pain Medicine

## 2024-07-07 ENCOUNTER — Encounter: Payer: Self-pay | Admitting: Pain Medicine

## 2024-07-07 VITALS — BP 156/78 | HR 68 | Temp 96.9°F | Resp 16 | Ht 68.0 in | Wt 275.0 lb

## 2024-07-07 DIAGNOSIS — M47816 Spondylosis without myelopathy or radiculopathy, lumbar region: Secondary | ICD-10-CM | POA: Diagnosis not present

## 2024-07-07 DIAGNOSIS — M79605 Pain in left leg: Secondary | ICD-10-CM | POA: Diagnosis not present

## 2024-07-07 DIAGNOSIS — G8918 Other acute postprocedural pain: Secondary | ICD-10-CM | POA: Diagnosis not present

## 2024-07-07 DIAGNOSIS — M4807 Spinal stenosis, lumbosacral region: Secondary | ICD-10-CM | POA: Insufficient documentation

## 2024-07-07 DIAGNOSIS — M4316 Spondylolisthesis, lumbar region: Secondary | ICD-10-CM | POA: Insufficient documentation

## 2024-07-07 DIAGNOSIS — M5459 Other low back pain: Secondary | ICD-10-CM | POA: Insufficient documentation

## 2024-07-07 DIAGNOSIS — G8929 Other chronic pain: Secondary | ICD-10-CM | POA: Diagnosis present

## 2024-07-07 DIAGNOSIS — M5442 Lumbago with sciatica, left side: Secondary | ICD-10-CM | POA: Diagnosis present

## 2024-07-07 DIAGNOSIS — M545 Low back pain, unspecified: Secondary | ICD-10-CM | POA: Diagnosis present

## 2024-07-07 DIAGNOSIS — R937 Abnormal findings on diagnostic imaging of other parts of musculoskeletal system: Secondary | ICD-10-CM | POA: Insufficient documentation

## 2024-07-07 NOTE — Progress Notes (Signed)
 Safety precautions to be maintained throughout the outpatient stay will include: orient to surroundings, keep bed in low position, maintain call bell within reach at all times, provide assistance with transfer out of bed and ambulation.

## 2024-07-07 NOTE — Patient Instructions (Addendum)
 Moderate Conscious Sedation, Adult Sedation is the use of medicines to help you relax and not feel pain. Moderate conscious sedation is a type of sedation that makes you less alert than normal. You are still able to respond to instructions, touch, or both. This type of sedation is used during short medical and dental procedures. It is milder than deep sedation, which is a type of sedation you cannot be easily woken up from. It is also milder than general anesthesia, which is the use of medicines to make you fall asleep. Moderate conscious sedation lets you return to your normal activities sooner. Tell a health care provider about: Any allergies you have. All medicines you are taking, including vitamins, herbs, steroids, eye drops, creams, and over-the-counter medicines. Any problems you or family members have had with anesthesia. Any bleeding problems you have. Any surgeries you have had. Any medical conditions you have. Whether you are pregnant or may be pregnant. Any recent alcohol, tobacco, or drug use. What are the risks? Your health care provider will talk with you about risks. These may include: Oversedation. This is when you get too much medicine. Nausea or vomiting. Allergic reaction to medicines. Trouble breathing. If this happens, a breathing tube may be used. It will be removed when you can breathe better on your own. Heart trouble. Lung trouble. Emergence delirium. This is when you feel confused while the sedation wears off. This gets better with time. What happens before the procedure? When to stop eating and drinking Follow instructions from your health care provider about what you may eat and drink. These may include: 8 hours before your procedure Stop eating most foods. Do not eat meat, fried foods, or fatty foods. Eat only light foods, such as toast or crackers. All liquids are okay except energy drinks and alcohol. 6 hours before your procedure Stop eating. Drink only  clear liquids, such as water , clear fruit juice, black coffee, plain tea, and sports drinks. Do not drink energy drinks or alcohol. 2 hours before your procedure Stop drinking all liquids. You may be allowed to take medicines with small sips of water . If you do not follow your health care provider's instructions, your procedure may be delayed or canceled. Medicines Ask your health care provider about: Changing or stopping your regular medicines. These include any diabetes medicines or blood thinners you take. Taking medicines such as aspirin  and ibuprofen. These medicines can thin your blood. Do not take them unless your health care provider tells you to. Taking over-the-counter medicines, vitamins, herbs, and supplements. Tests and exams You may have an exam or testing. You may have a blood or urine sample taken. General instructions Do not use any products that contain nicotine or tobacco for at least 4 weeks before the procedure. These products include cigarettes, chewing tobacco, and vaping devices, such as e-cigarettes. If you need help quitting, ask your health care provider. If you will be going home right after the procedure, plan to have a responsible adult: Take you home from the hospital or clinic. You will not be allowed to drive. Care for you for the time you are told. What happens during the procedure?  You will be given the sedative. It may be given: As a pill you can take by mouth. It can also be put into the rectum. As a spray through the nose. As an injection into muscle. As an injection into a vein through an IV. You may be given oxygen as needed. Your blood pressure, heart  rate, breathing rate, and blood oxygen level will be monitored during the procedure. The medical or dental procedure will be done. The procedure may vary among health care providers and hospitals. What happens after the procedure? Your blood pressure, heart rate, breathing rate, and blood oxygen  level will be monitored until you leave the hospital or clinic. You will get fluids through an IV as needed. Do not drive or operate machinery until your health care provider says that it is safe. This information is not intended to replace advice given to you by your health care provider. Make sure you discuss any questions you have with your health care provider. Document Revised: 05/29/2022 Document Reviewed: 05/29/2022 Elsevier Patient Education  2024 [employer]Epidural Steroid Injection Patient Information  Description: The epidural space surrounds the nerves as they exit the spinal cord.  In some patients, the nerves can be compressed and inflamed by a bulging disc or a tight spinal canal (spinal stenosis).  By injecting steroids into the epidural space, we can bring irritated nerves into direct contact with a potentially helpful medication.  These steroids act directly on the irritated nerves and can reduce swelling and inflammation which often leads to decreased pain.  Epidural steroids may be injected anywhere along the spine and from the neck to the low back depending upon the location of your pain.   After numbing the skin with local anesthetic (like Novocaine), a small needle is passed into the epidural space slowly.  You may experience a sensation of pressure while this is being done.  The entire block usually last less than 10 minutes.  Conditions which may be treated by epidural steroids:  Low back and leg pain Neck and arm pain Spinal stenosis Post-laminectomy syndrome Herpes zoster (shingles) pain Pain from compression fractures  Preparation for the injection:  Do not eat any solid food or dairy products within 8 hours of your appointment.  You may drink clear liquids up to 3 hours before appointment.  Clear liquids include water , black coffee, juice or soda.  No milk or cream please. You may take your regular medication, including pain medications, with a sip of water   before your appointment  Diabetics should hold regular insulin (if taken separately) and take 1/2 normal NPH dos the morning of the procedure.  Carry some sugar containing items with you to your appointment. A driver must accompany you and be prepared to drive you home after your procedure.  Bring all your current medications with your. An IV may be inserted and sedation may be given at the discretion of the physician.   A blood pressure cuff, EKG and other monitors will often be applied during the procedure.  Some patients may need to have extra oxygen administered for a short period. You will be asked to provide medical information, including your allergies, prior to the procedure.  We must know immediately if you are taking blood thinners (like Coumadin/Warfarin)  Or if you are allergic to IV iodine contrast (dye). We must know if you could possible be pregnant.  Possible side-effects: Bleeding from needle site Infection (rare, may require surgery) Nerve injury (rare) Numbness & tingling (temporary) Difficulty urinating (rare, temporary) Spinal headache ( a headache worse with upright posture) Light -headedness (temporary) Pain at injection site (several days) Decreased blood pressure (temporary) Weakness in arm/leg (temporary) Pressure sensation in back/neck (temporary)  Call if you experience: Fever/chills associated with headache or increased back/neck pain. Headache worsened by an upright position. New onset weakness or  numbness of an extremity below the injection site Hives or difficulty breathing (go to the emergency room) Inflammation or drainage at the infection site Severe back/neck pain Any new symptoms which are concerning to you  Please note:  Although the local anesthetic injected can often make your back or neck feel good for several hours after the injection, the pain will likely return.  It takes 3-7 days for steroids to work in the epidural space.  You may not  notice any pain relief for at least that one week.  If effective, we will often do a series of three injections spaced 3-6 weeks apart to maximally decrease your pain.  After the initial series, we generally will wait several months before considering a repeat injection of the same type.  If you have any questions, please call [phone] [facility] ______________________________________________________________________    Procedure instructions  Stop blood-thinners  Do not eat or drink fluids (other than water ) for 6 hours before your procedure  No water  for 2 hours before your procedure  Take your blood pressure medicine with a sip of water   Arrive 30 minutes before your appointment  If sedation is planned, bring suitable driver. [place], [place], & public transportation are NOT APPROVED)  Carefully read the Preparing for your procedure detailed instructions  If you have questions call us  at [phone]  Procedure appointments are for procedures only.   NO medication refills or new problem evaluations will be done on procedure days.   Only the scheduled, pre-approved procedure and side will be done.   ______________________________________________________________________      ______________________________________________________________________    Preparing for your procedure  Appointments: If you think you may not be able to keep your appointment, call 24-48 hours in advance to cancel. We need time to make it available to others.  Procedure visits are for procedures only. During your procedure appointment there will be: NO Prescription Refills*. NO medication changes or discussions*. NO discussion of disability issues*. NO unrelated pain problem evaluations*. NO evaluations to order other pain procedures*. *These will be addressed at a separate and distinct evaluation encounter on the provider's evaluation schedule and not  during procedure days.  Instructions: Food intake: Avoid eating anything solid for at least 8 hours prior to your procedure. Clear liquid intake: You may take clear liquids such as water  up to 2 hours prior to your procedure. (No carbonated drinks. No soda.) Transportation: Unless otherwise stated by your physician, bring a driver. (Driver cannot be a Market researcher, Pharmacist, community, or any other form of public transportation.) Morning Medicines: Except for blood thinners, take all of your other morning medications with a sip of water . Make sure to take your heart and blood pressure medicines. If your blood pressure's lower number is above 100, the case will be rescheduled. Blood thinners: Make sure to stop your blood thinners as instructed.  If you take a blood thinner, but were not instructed to stop it, call our office [phone] and ask to talk to a nurse. Not stopping a blood thinner prior to certain procedures could lead to serious complications. Diabetics on insulin: Notify the staff so that you can be scheduled 1st case in the morning. If your diabetes requires high dose insulin, take only  of your normal insulin dose the morning of the procedure and notify the staff that you have done so. Preventing infections: Shower with an antibacterial soap the morning of your procedure.  Build-up your immune system: Take 1000  mg of Vitamin C  with every meal (3 times a day) the day prior to your procedure. Antibiotics: Inform the nursing staff if you are taking any antibiotics or if you have any conditions that may require antibiotics prior to procedures. (Example: recent joint implants)   Pregnancy: If you are pregnant make sure to notify the nursing staff. Not doing so may result in injury to the fetus, including death.  Sickness: If you have a cold, fever, or any active infections, call and cancel or reschedule your procedure. Receiving steroids while having an infection may result in complications. Arrival: You must  be in the facility at least 30 minutes prior to your scheduled procedure. Tardiness: Your scheduled time is also the cutoff time. If you do not arrive at least 15 minutes prior to your procedure, you will be rescheduled.  Children: Do not bring any children with you. Make arrangements to keep them home. Dress appropriately: There is always a possibility that your clothing may get soiled. Avoid long dresses. Valuables: Do not bring any jewelry or valuables.  Reasons to call and reschedule or cancel your procedure: (Following these recommendations will minimize the risk of a serious complication.) Surgeries: Avoid having procedures within 2 weeks of any surgery. (Avoid for 2 weeks before or after any surgery). Flu Shots: Avoid having procedures within 2 weeks of a flu shots or . (Avoid for 2 weeks before or after immunizations). Barium: Avoid having a procedure within 7-10 days after having had a radiological study involving the use of radiological contrast. (Myelograms, Barium swallow or enema study). Heart attacks: Avoid any elective procedures or surgeries for the initial 6 months after a Myocardial Infarction (Heart Attack). Blood thinners: It is imperative that you stop these medications before procedures. Let us  know if you if you take any blood thinner.  Infection: Avoid procedures during or within two weeks of an infection (including chest colds or gastrointestinal problems). Symptoms associated with infections include: Localized redness, fever, chills, night sweats or profuse sweating, burning sensation when voiding, cough, congestion, stuffiness, runny nose, sore throat, diarrhea, nausea, vomiting, cold or Flu symptoms, recent or current infections. It is specially important if the infection is over the area that we intend to treat. Heart and lung problems: Symptoms that may suggest an active cardiopulmonary problem include: cough, chest pain, breathing difficulties or shortness of breath,  dizziness, ankle swelling, uncontrolled high or unusually low blood pressure, and/or palpitations. If you are experiencing any of these symptoms, cancel your procedure and contact your primary care physician for an evaluation.  Remember:  Regular Business hours are:  Monday to Thursday 8:00 AM to 4:00 PM  Provider's Schedule: Eric Como, MD:  Procedure days: Tuesday and Thursday 7:30 AM to 4:00 PM  Wallie Sherry, MD:  Procedure days: Monday and Wednesday 7:30 AM to 4:00 PM Last  Updated: 11/06/2023 ______________________________________________________________________      ______________________________________________________________________    General Risks and Possible Complications  Patient Responsibilities: It is important that you read this as it is part of your informed consent. It is our duty to inform you of the risks and possible complications associated with treatments offered to you. It is your responsibility as a patient to read this and to ask questions about anything that is not clear or that you believe was not covered in this document.  Patient's Rights: You have the right to refuse treatment. You also have the right to change your mind, even after initially having agreed to have the treatment done.  However, under this last option, if you wait until the last second to change your mind, you may be charged for the materials used up to that point.  Introduction: Medicine is not an Visual merchandiser. Everything in Medicine, including the lack of treatment(s), carries the potential for danger, harm, or loss (which is by definition: Risk). In Medicine, a complication is a secondary problem, condition, or disease that can aggravate an already existing one. All treatments carry the risk of possible complications. The fact that a side effects or complications occurs, does not imply that the treatment was conducted incorrectly. It must be clearly understood that these can happen even  when everything is done following the highest safety standards.  No treatment: You can choose not to proceed with the proposed treatment alternative. The "PRO(s)" would include: avoiding the risk of complications associated with the therapy. The "CON(s)" would include: not getting any of the treatment benefits. These benefits fall under one of three categories: diagnostic; therapeutic; and/or palliative. Diagnostic benefits include: getting information which can ultimately lead to improvement of the disease or symptom(s). Therapeutic benefits are those associated with the successful treatment of the disease. Finally, palliative benefits are those related to the decrease of the primary symptoms, without necessarily curing the condition (example: decreasing the pain from a flare-up of a chronic condition, such as incurable terminal cancer).  General Risks and Complications: These are associated to most interventional treatments. They can occur alone, or in combination. They fall under one of the following six (6) categories: no benefit or worsening of symptoms; bleeding; infection; nerve damage; allergic reactions; and/or death. No benefits or worsening of symptoms: In Medicine there are no guarantees, only probabilities. No healthcare provider can ever guarantee that a medical treatment will work, they can only state the probability that it may. Furthermore, there is always the possibility that the condition may worsen, either directly, or indirectly, as a consequence of the treatment. Bleeding: This is more common if the patient is taking a blood thinner, either prescription or over the counter (example: Goody Powders, Fish oil, Aspirin , Garlic, etc.), or if suffering a condition associated with impaired coagulation (example: Hemophilia, cirrhosis of the liver, low platelet counts, etc.). However, even if you do not have one on these, it can still happen. If you have any of these conditions, or take one of  these drugs, make sure to notify your treating physician. Infection: This is more common in patients with a compromised immune system, either due to disease (example: diabetes, cancer, human immunodeficiency virus [HIV], etc.), or due to medications or treatments (example: therapies used to treat cancer and rheumatological diseases). However, even if you do not have one on these, it can still happen. If you have any of these conditions, or take one of these drugs, make sure to notify your treating physician. Nerve Damage: This is more common when the treatment is an invasive one, but it can also happen with the use of medications, such as those used in the treatment of cancer. The damage can occur to small secondary nerves, or to large primary ones, such as those in the spinal cord and brain. This damage may be temporary or permanent and it may lead to impairments that can range from temporary numbness to permanent paralysis and/or brain death. Allergic Reactions: Any time a substance or material comes in contact with our body, there is the possibility of an allergic reaction. These can range from a mild skin rash (contact dermatitis) to a severe  systemic reaction (anaphylactic reaction), which can result in death. Death: In general, any medical intervention can result in death, most of the time due to an unforeseen complication. ______________________________________________________________________

## 2024-07-17 ENCOUNTER — Ambulatory Visit: Admitting: Cardiology

## 2024-07-24 ENCOUNTER — Other Ambulatory Visit: Payer: Self-pay | Admitting: Family Medicine

## 2024-07-24 DIAGNOSIS — R519 Headache, unspecified: Secondary | ICD-10-CM

## 2024-07-29 ENCOUNTER — Encounter: Payer: Self-pay | Admitting: Pain Medicine

## 2024-07-29 ENCOUNTER — Ambulatory Visit
Admission: RE | Admit: 2024-07-29 | Discharge: 2024-07-29 | Disposition: A | Discharge status: Home / Self Care | Source: Ambulatory Visit | Attending: Pain Medicine | Admitting: Pain Medicine

## 2024-07-29 ENCOUNTER — Ambulatory Visit (HOSPITAL_BASED_OUTPATIENT_CLINIC_OR_DEPARTMENT_OTHER): Admitting: Pain Medicine

## 2024-07-29 VITALS — BP 183/95 | HR 59 | Temp 97.5°F | Resp 16 | Ht 68.0 in | Wt 275.0 lb

## 2024-07-29 DIAGNOSIS — Z6841 Body Mass Index (BMI) 40.0 and over, adult: Secondary | ICD-10-CM | POA: Insufficient documentation

## 2024-07-29 DIAGNOSIS — M5416 Radiculopathy, lumbar region: Secondary | ICD-10-CM

## 2024-07-29 DIAGNOSIS — M4807 Spinal stenosis, lumbosacral region: Secondary | ICD-10-CM | POA: Insufficient documentation

## 2024-07-29 DIAGNOSIS — Z91041 Radiographic dye allergy status: Secondary | ICD-10-CM

## 2024-07-29 DIAGNOSIS — Z91040 Latex allergy status: Secondary | ICD-10-CM

## 2024-07-29 DIAGNOSIS — G8929 Other chronic pain: Secondary | ICD-10-CM

## 2024-07-29 DIAGNOSIS — R937 Abnormal findings on diagnostic imaging of other parts of musculoskeletal system: Secondary | ICD-10-CM | POA: Insufficient documentation

## 2024-07-29 DIAGNOSIS — M5442 Lumbago with sciatica, left side: Secondary | ICD-10-CM | POA: Insufficient documentation

## 2024-07-29 DIAGNOSIS — M79605 Pain in left leg: Secondary | ICD-10-CM | POA: Insufficient documentation

## 2024-07-29 DIAGNOSIS — M4316 Spondylolisthesis, lumbar region: Secondary | ICD-10-CM

## 2024-07-29 DIAGNOSIS — Z9189 Other specified personal risk factors, not elsewhere classified: Secondary | ICD-10-CM | POA: Diagnosis present

## 2024-07-29 MED ORDER — PENTAFLUOROPROP-TETRAFLUOROETH EX AERO: INHALATION_SPRAY | Freq: Once | CUTANEOUS | Status: AC

## 2024-07-29 MED ORDER — ROPIVACAINE HCL 2 MG/ML IJ SOLN
2.0000 mL | Freq: Once | INTRAMUSCULAR | Status: AC
  Administered 2024-07-29: 2 mL via EPIDURAL

## 2024-07-29 MED ORDER — SODIUM CHLORIDE (PF) 0.9 % IJ SOLN
INTRAMUSCULAR | Status: AC
  Filled 2024-07-29: qty 10

## 2024-07-29 MED ORDER — ROPIVACAINE HCL 2 MG/ML IJ SOLN
INTRAMUSCULAR | Status: AC
  Filled 2024-07-29: qty 20

## 2024-07-29 MED ORDER — TRIAMCINOLONE ACETONIDE 40 MG/ML IJ SUSP
INTRAMUSCULAR | Status: AC
  Filled 2024-07-29: qty 1

## 2024-07-29 MED ORDER — LIDOCAINE HCL 2 % IJ SOLN
20.0000 mL | Freq: Once | INTRAMUSCULAR | Status: AC
  Administered 2024-07-29: 400 mg

## 2024-07-29 MED ORDER — IOHEXOL 180 MG/ML  SOLN
INTRAMUSCULAR | Status: AC
  Filled 2024-07-29: qty 10

## 2024-07-29 MED ORDER — TRIAMCINOLONE ACETONIDE 40 MG/ML IJ SUSP
40.0000 mg | Freq: Once | INTRAMUSCULAR | Status: AC
  Administered 2024-07-29: 40 mg

## 2024-07-29 MED ORDER — MIDAZOLAM HCL 5 MG/5ML IJ SOLN
INTRAMUSCULAR | Status: AC
  Filled 2024-07-29: qty 5

## 2024-07-29 MED ORDER — MIDAZOLAM HCL 5 MG/5ML IJ SOLN
0.5000 mg | Freq: Once | INTRAMUSCULAR | Status: AC
  Administered 2024-07-29: 2 mg via INTRAVENOUS

## 2024-07-29 MED ORDER — FENTANYL CITRATE (PF) 100 MCG/2ML IJ SOLN
INTRAMUSCULAR | Status: AC
  Filled 2024-07-29: qty 2

## 2024-07-29 MED ORDER — SODIUM CHLORIDE 0.9% FLUSH
2.0000 mL | Freq: Once | INTRAVENOUS | Status: AC
  Administered 2024-07-29: 2 mL

## 2024-07-29 MED ORDER — FENTANYL CITRATE (PF) 100 MCG/2ML IJ SOLN: 25.0000 ug | INTRAMUSCULAR | Status: DC | PRN

## 2024-07-29 MED ORDER — LIDOCAINE HCL 2 % IJ SOLN
INTRAMUSCULAR | Status: AC
  Filled 2024-07-29: qty 20

## 2024-07-29 NOTE — Progress Notes (Signed)
 Safety precautions to be maintained throughout the outpatient stay will include: orient to surroundings, keep bed in low position, maintain call bell within reach at all times, provide assistance with transfer out of bed and ambulation.   Pt given pink sleeve for left arm.

## 2024-07-29 NOTE — Progress Notes (Signed)
 PROVIDER NOTE: Interpretation of information contained herein should be left to medically-trained personnel. Specific patient instructions are provided elsewhere under Patient Instructions section of medical record. This document was created in part using STT-dictation technology, any transcriptional errors that may result from this process are unintentional.  Patient: Heather Mcintyre Type: Established DOB: 11-10-1966 MRN: [id number] PCP: Lorel Maxie LABOR, MD  Service: Procedure DOS: 07/29/2024 Setting: Ambulatory Location: Ambulatory outpatient facility Delivery: Face-to-face Provider: Eric LABOR Como, MD Specialty: Interventional Pain Management Specialty designation: 09 Location: Outpatient facility Ref. Prov.: Como Eric, MD       Interventional Therapy   Type: Lumbar epidural steroid injection (LESI) (interlaminar) #1    Laterality: Left   Level:  L2-3 Level.  Imaging: Fluoroscopic guidance Spinal ([id number]) Anesthesia: Local anesthesia (1-2% Lidocaine ) Anxiolysis: IV Versed  2.0 mg Sedation: Minimal Sedation None required. No Fentanyl  administered.         DOS: 07/29/2024  Performed by: Eric LABOR Como, MD  Purpose: Diagnostic/Therapeutic Indications: Lumbar radicular pain of intraspinal etiology of more than 4 weeks that has failed to respond to conservative therapy and is severe enough to impact quality of life or function. 1. 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 Chronic lower extremity pain (Left)   3. Grade 1 Anterolisthesis of lumbar spine (L4/L5)   4. Radiculopathy of lumbar region   5. Spondylolisthesis, lumbar region   6. Abnormal MRI, lumbar spine (03/14/2019)   7. History of allergy  to latex   8. History of allergy  to radiographic contrast media   9. Latex precautions, history of latex allergy    10. Morbid obesity with BMI of 40.0-44.9, adult (HCC)   11. At high risk for allergic reaction to latex    NAS-11 Pain score:    Pre-procedure: 5 /10   Post-procedure: 0-No pain/10      Position / Prep / Materials:  Position: Prone w/ head of the table raised (slight reverse trendelenburg) to facilitate breathing.  Prep solution: ChloraPrep (2% chlorhexidine  gluconate and 70% isopropyl alcohol) Prep Area: Entire Posterior Lumbar Region from lower scapular tip down to mid buttocks area and from flank to flank. Materials:  Tray: Epidural tray Needle(s):  Type: Epidural needle (Tuohy) Gauge (G):  17 Length: Regular (3.5-in) Qty: 1  H&P (Pre-op Assessment):  Heather Mcintyre is a 58 y.o. (year old), female patient, seen today for interventional treatment. 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 Heather Mcintyre has a current medication list which includes the following prescription(s): acetaminophen , aloe vera, anastrozole , ascorbic acid , azelastine , b complex vitamins, carvedilol , celecoxib , cetirizine , colchicine , desonide , diphenhydramine, epinephrine , fluticasone , furosemide, gabapentin , global ease inject pen needles, hydroxyzine , irbesartan , levocetirizine, linaclotide , montelukast , multivitamin-lutein, omeprazole, pimecrolimus , rosuvastatin , tizanidine , trazodone , ventolin hfa, xiidra, and [DISCONTINUED] metoprolol  succinate, and the following Facility-Administered Medications: fentanyl . Her primarily concern today is the Back Pain  Initial Vital Signs:  Pulse/HCG Rate: (!) 59ECG Heart Rate: 62 Temp: (!) 97.5 F (36.4 C) Resp: 16 BP: (!) 185/80 (pt denies headache or blurred vision) SpO2: 99  %  BMI: Estimated body mass index is 41.81 kg/m as calculated from the following:   Height as of this encounter: 5' 8 (1.727 m).   Weight as of this encounter: 275 lb (  124.7 kg).  Risk Assessment: Allergies: Reviewed. She is allergic to penicillin g, cephalexin, etodolac, iodinated contrast media, latex, fish-derived products, hydrochlorothiazide , iodine, penicillins, shellfish allergy , and lisinopril.  Allergy  Precautions: None required Coagulopathies: Reviewed. None identified.  Blood-thinner therapy: None at this time Active Infection(s): Reviewed. None identified. Heather Mcintyre is afebrile  Site Confirmation: Heather Mcintyre was asked to confirm the procedure and laterality before marking the site Procedure checklist: Completed Consent: Before the procedure and under the influence of no sedative(s), amnesic(s), or anxiolytics, the patient was informed of the treatment options, risks and possible complications. To fulfill our ethical and legal obligations, as recommended by the American Medical Association's Code of Ethics, I have informed the patient of my clinical impression; the nature and purpose of the treatment or procedure; the risks, benefits, and possible complications of the intervention; the alternatives, including doing nothing; the risk(s) and benefit(s) of the alternative treatment(s) or procedure(s); and the risk(s) and benefit(s) of doing nothing. The patient was provided information about the general risks and possible complications associated with the procedure. These may include, but are not limited to: failure to achieve desired goals, infection, bleeding, organ or nerve damage, allergic reactions, paralysis, and death. In addition, the patient was informed of those risks and complications associated to Spine-related procedures, such as failure to decrease pain; infection (i.e.: Meningitis, epidural or intraspinal abscess); bleeding (i.e.: epidural hematoma, subarachnoid  hemorrhage, or any other type of intraspinal or peri-dural bleeding); organ or nerve damage (i.e.: Any type of peripheral nerve, nerve root, or spinal cord injury) with subsequent damage to sensory, motor, and/or autonomic systems, resulting in permanent pain, numbness, and/or weakness of one or several areas of the body; allergic reactions; (i.e.: anaphylactic reaction); and/or death. Furthermore, the patient was informed of those risks and complications associated with the medications. These include, but are not limited to: allergic reactions (i.e.: anaphylactic or anaphylactoid reaction(s)); adrenal axis suppression; blood sugar elevation that in diabetics may result in ketoacidosis or comma; water  retention that in patients with history of congestive heart failure may result in shortness of breath, pulmonary edema, and decompensation with resultant heart failure; weight gain; swelling or edema; medication-induced neural toxicity; particulate matter embolism and blood vessel occlusion with resultant organ, and/or nervous system infarction; and/or aseptic necrosis of one or more joints. Finally, the patient was informed that Medicine is not an exact science; therefore, there is also the possibility of unforeseen or unpredictable risks and/or possible complications that may result in a catastrophic outcome. The patient indicated having understood very clearly. We have given the patient no guarantees and we have made no promises. Enough time was given to the patient to ask questions, all of which were answered to the patient's satisfaction. Ms. Uplinger has indicated that she wanted to continue with the procedure. Attestation: I, the ordering provider, attest that I have discussed with the patient the benefits, risks, side-effects, alternatives, likelihood of achieving goals, and potential problems during recovery for the procedure that I have provided informed consent. Date  Time: 07/29/2024  9:15  AM  Pre-Procedure Preparation:  Monitoring: As per clinic protocol. Respiration, ETCO2, SpO2, BP, heart rate and rhythm monitor placed and checked for adequate function Safety Precautions: Patient was assessed for positional comfort and pressure points before starting the procedure. Time-out: I initiated and conducted the Time-out before starting the procedure, as per protocol. The patient was asked to participate by confirming the accuracy of the Time Out information. Verification of the correct person, site, and procedure were  performed and confirmed by me, the nursing staff, and the patient. Time-out conducted as per Joint Commission's Universal Protocol (UP.01.01.01). Time: 1031 Start Time: 1031 hrs.  Description/Narrative of Procedure:          Target: Epidural space via interlaminar opening, initially targeting the lower laminar border of the superior vertebral body. Region: Lumbar Approach: Percutaneous paravertebral  Rationale (medical necessity): procedure needed and proper for the diagnosis and/or treatment of the patient's medical symptoms and needs. Procedural Technique Safety Precautions: Aspiration looking for blood return was conducted prior to all injections. At no point did we inject any substances, as a needle was being advanced. No attempts were made at seeking any paresthesias. Safe injection practices and needle disposal techniques used. Medications properly checked for expiration dates. SDV (single dose vial) medications used. Description of the Procedure: Protocol guidelines were followed. The procedure needle was introduced through the skin, ipsilateral to the reported pain, and advanced to the target area. Bone was contacted and the needle walked caudad, until the lamina was cleared. The epidural space was identified using "loss-of-resistance technique" with 2-3 ml of PF-NaCl (0.9% NSS), in a 5cc LOR glass syringe.  Vitals:   07/29/24 1033 07/29/24 1038 07/29/24 1043  07/29/24 1050  BP: (!) 174/92 (!) 174/96 (!) 173/95 (!) 183/95  Pulse:      Resp: 14 15 16 16   Temp:      TempSrc:      SpO2: 99% 100% 100% 98%  Weight:      Height:        Start Time: 1031 hrs. End Time: 1042 hrs.  Imaging Guidance (Spinal):          Type of Imaging Technique: Fluoroscopy Guidance (Spinal) Indication(s): Fluoroscopy guidance for needle placement to enhance accuracy in procedures requiring precise needle localization for targeted delivery of medication in or near specific anatomical locations not easily accessible without such real-time imaging assistance. Exposure Time: Please see nurses notes. Contrast: None used. Fluoroscopic Guidance: I was personally present during the use of fluoroscopy. Tunnel Vision Technique used to obtain the best possible view of the target area. Parallax error corrected before commencing the procedure. Direction-depth-direction technique used to introduce the needle under continuous pulsed fluoroscopy. Once target was reached, antero-posterior, oblique, and lateral fluoroscopic projection used confirm needle placement in all planes. Images permanently stored in EMR. Interpretation: No contrast injected. I personally interpreted the imaging intraoperatively. Adequate needle placement confirmed in multiple planes. Permanent images saved into the patient's record.  Antibiotic Prophylaxis:   Anti-infectives (From admission, onward)    None      Indication(s): None identified  Post-operative Assessment:  Post-procedure Vital Signs:  Pulse/HCG Rate: (!) 59(!) 59 Temp: (!) 97.5 F (36.4 C) Resp: 16 BP: (!) 183/95 SpO2: 98 %  EBL: None  Complications: No immediate post-treatment complications observed by team, or reported by patient.  Note: The patient tolerated the entire procedure well. A repeat set of vitals were taken after the procedure and the patient was kept under observation following institutional policy, for this type  of procedure. Post-procedural neurological assessment was performed, showing return to baseline, prior to discharge. The patient was provided with post-procedure discharge instructions, including a section on how to identify potential problems. Should any problems arise concerning this procedure, the patient was given instructions to immediately contact us , at any time, without hesitation. In any case, we plan to contact the patient by telephone for a follow-up status report regarding this interventional procedure.  Comments:  No  additional relevant information.  Plan of Care (POC)  Orders:  Orders Placed This Encounter  Procedures   Lumbar Epidural Injection    Scheduling Instructions:     Procedure: Interlaminar LESI L2-3     Laterality: Left     Sedation: With Sedation     Date: 07/29/2024    Where will this procedure be performed?:   [facility]   [facility] C-ARM 1-60 MIN NO REPORT    Intraoperative interpretation by procedural physician at [facility].    Standing Status:   Standing    Number of Occurrences:   1    Reason for exam::   Assistance in needle guidance and placement for procedures requiring needle placement in or near specific anatomical locations not easily accessible without such assistance.   Informed Consent Details: Physician/Practitioner Attestation; Transcribe to consent form and obtain patient signature    Note: Always confirm laterality of pain with Ms. Jawad, before procedure. Transcribe to consent form and obtain patient signature.    Physician/Practitioner attestation of informed consent for procedure/surgical case:   I, the physician/practitioner, attest that I have discussed with the patient the benefits, risks, side effects, alternatives, likelihood of achieving goals and potential problems during recovery for the procedure that I have provided informed consent.    Procedure:   Lumbar epidural steroid injection under fluoroscopic  guidance    Physician/Practitioner performing the procedure:   Marye Eagen A. Vaanya Shambaugh, MD    Indication/Reason:   Low back and/or lower extremity pain secondary to lumbar radiculitis   Provide equipment / supplies at bedside    Procedural tray: Epidural Tray (Disposable  single use) Skin infiltration needle: Regular 1.5-in, 25-G, (x1) Block needle size: Long Catheter: No catheter required    Standing Status:   Standing    Number of Occurrences:   1    Specify:   Epidural Tray   Saline lock IV    Have LR [id number] mL available and administer at 125 mL/hr if patient becomes hypotensive.    Standing Status:   Standing    Number of Occurrences:   1   Miscellanous precautions    Standing Status:   Standing    Number of Occurrences:   1   Miscellanous precautions    NOTE: Although It is true that patients can have allergies to shellfish and that shellfish contain iodine, most shellfish  allergies are due to two protein allergens present in the shellfish: tropomyosins and parvalbumin. Not all patients with shellfish allergies are allergic to iodine. However, as a precaution, avoid using iodine containing products.    Standing Status:   Standing    Number of Occurrences:   1   Latex precautions    Activate Latex-Free Protocol.    Standing Status:   Standing    Number of Occurrences:   1     Opioid Analgesic:  No chronic opioid analgesics therapy prescribed by our practice. None. MME/day: 0 mg/day    Medications ordered for procedure: Meds ordered this encounter  Medications   lidocaine  (XYLOCAINE ) 2 % (with pres) injection 400 mg   pentafluoroprop-tetrafluoroeth (GEBAUERS) aerosol   midazolam  (VERSED ) 5 MG/5ML injection 0.5-2 mg    Make sure Flumazenil is available in the pyxis when using this medication. If oversedation occurs, administer 0.2 mg IV over 15 sec. If after 45 sec no response, administer 0.2 mg again over 1 min; may repeat at 1 min intervals; not to exceed 4 doses (1 mg)  fentaNYL  (SUBLIMAZE ) injection 25-50 mcg    Make sure Narcan is available in the pyxis when using this medication. In the event of respiratory depression (RR< 8/min): Titrate NARCAN (naloxone) in increments of 0.1 to 0.2 mg IV at 2-3 minute intervals, until desired degree of reversal.   sodium chloride  flush (NS) 0.9 % injection 2 mL   ropivacaine  (PF) 2 mg/mL (0.2%) (NAROPIN ) injection 2 mL   triamcinolone  acetonide (KENALOG -40) injection 40 mg   Medications administered: We administered lidocaine , pentafluoroprop-tetrafluoroeth, midazolam , sodium chloride  flush, ropivacaine  (PF) 2 mg/mL (0.2%), and triamcinolone  acetonide.  See the medical record for exact dosing, route, and time of administration.    Interventional Therapies  Risk Factors  Considerations  Medical Comorbidities:  ALLERGY : Contrast, Latex, Iodine, NSAIDS, PCN  MO (BMI>40)  GERD  HTN  Hx. Bradycardia     Planned  Pending:   Diagnostic Left lumbar facet MBB #1    Under consideration:   Diagnostic bilateral lumbar facet MBB #1    Completed:   None at this time   Therapeutic  Palliative (PRN) options:   None established   Completed by other providers:   None reported      Follow-up plan:   Return in about 2 weeks (around 08/12/2024) for Eval-day (M,W), (Face2F), (PPE).     Recent Visits Date Type Provider Dept  07/07/24 Office Visit Tanya Glisson, MD [facility]  06/24/24 Procedure visit Tanya Glisson, MD [facility]  06/09/24 Office Visit Tanya Glisson, MD [facility]  Showing recent visits within past 90 days and meeting all other requirements Today's Visits Date Type Provider Dept  07/29/24 Procedure visit Tanya Glisson, MD [facility]  Showing today's visits and meeting all other requirements Future Appointments Date Type Provider Dept  08/13/24 Appointment Tanya Glisson, MD [facility]  Showing future appointments  within next 90 days and meeting all other requirements   Disposition: Discharge home  Discharge (Date  Time): 07/29/2024;   hrs.   Primary Care Physician: Lorel Maxie LABOR, MD Location: [facility] Note by: Glisson LABOR Tanya, MD (TTS technology used. I apologize for any typographical errors that were not detected and corrected.) Date: 07/29/2024; Time: 10:58 AM  Disclaimer:  Medicine is not an Visual merchandiser. The only guarantee in medicine is that nothing is guaranteed. It is important to note that the decision to proceed with this intervention was based on the information collected from the patient. The Data and conclusions were drawn from the patient's questionnaire, the interview, and the physical examination. Because the information was provided in large part by the patient, it cannot be guaranteed that it has not been purposely or unconsciously manipulated. Every effort has been made to obtain as much relevant data as possible for this evaluation. It is important to note that the conclusions that lead to this procedure are derived in large part from the available data. Always take into account that the treatment will also be dependent on availability of resources and existing treatment guidelines, considered by other Pain Management Practitioners as being common knowledge and practice, at the time of the intervention. For Medico-Legal purposes, it is also important to point out that variation in procedural techniques and pharmacological choices are the acceptable norm. The indications, contraindications, technique, and results of the above procedure should only be interpreted and judged by a Board-Certified Interventional Pain Specialist with extensive familiarity and expertise in the same exact procedure and technique.

## 2024-07-29 NOTE — Patient Instructions (Signed)
 ______________________________________________________________________    Post-Procedure Discharge Instructions  Instructions: Apply ice:  Purpose: This will minimize any swelling and discomfort after procedure.  When: Day of procedure, as soon as you get home. How: Fill a plastic sandwich bag with crushed ice. Cover it with a small towel and apply to injection site. How long: (15 min on, 15 min off) Apply for 15 minutes then remove x 15 minutes.  Repeat sequence on day of procedure, until you go to bed. Apply heat:  Purpose: To treat any soreness and discomfort from the procedure. When: Starting the next day after the procedure. How: Apply heat to procedure site starting the day following the procedure. How long: May continue to repeat daily, until discomfort goes away. Food intake: Start with clear liquids (like water ) and advance to regular food, as tolerated.  Physical activities: Keep activities to a minimum for the first 8 hours after the procedure. After that, then as tolerated. Driving: If you have received any sedation, be responsible and do not drive. You are not allowed to drive for 24 hours after having sedation. Blood thinner: (Applies only to those taking blood thinners) You may restart your blood thinner 6 hours after your procedure. Insulin: (Applies only to Diabetic patients taking insulin) As soon as you can eat, you may resume your normal dosing schedule. Infection prevention: Keep procedure site clean and dry. Shower daily and clean area with soap and water . Post-procedure Pain Diary: Extremely important that this be done correctly and accurately. Recorded information will be used to determine the next step in treatment. For the purpose of accuracy, follow these rules: Evaluate only the area treated. Do not report or include pain from an untreated area. For the purpose of this evaluation, ignore all other areas of pain, except for the treated area. After your procedure,  avoid taking a long nap and attempting to complete the pain diary after you wake up. Instead, set your alarm clock to go off every hour, on the hour, for the initial 8 hours after the procedure. Document the duration of the numbing medicine, and the relief you are getting from it. Do not go to sleep and attempt to complete it later. It will not be accurate. If you received sedation, it is likely that you were given a medication that may cause amnesia. Because of this, completing the diary at a later time may cause the information to be inaccurate. This information is needed to plan your care. Follow-up appointment: Keep your post-procedure follow-up evaluation appointment after the procedure (usually 2 weeks for most procedures, 6 weeks for radiofrequencies). DO NOT FORGET to bring you pain diary with you.   Expect: (What should I expect to see with my procedure?) From numbing medicine (AKA: Local Anesthetics): Numbness or decrease in pain. You may also experience some weakness, which if present, could last for the duration of the local anesthetic. Onset: Full effect within 15 minutes of injected. Duration: It will depend on the type of local anesthetic used. On the average, 1 to 8 hours.  From steroids (Applies only if steroids were used): Decrease in swelling or inflammation. Once inflammation is improved, relief of the pain will follow. Onset of benefits: Depends on the amount of swelling present. The more swelling, the longer it will take for the benefits to be seen. In some cases, up to 10 days. Duration: Steroids will stay in the system x 2 weeks. Duration of benefits will depend on multiple posibilities including persistent irritating factors. Side-effects: If  present, they may typically last 2 weeks (the duration of the steroids). Frequent: Cramps (if they occur, drink Gatorade and take over-the-counter Magnesium 450-500 mg once to twice a day); water  retention with temporary weight gain;  increases in blood sugar; decreased immune system response; increased appetite. Occasional: Facial flushing (red, warm cheeks); mood swings; menstrual changes. Uncommon: Long-term decrease or suppression of natural hormones; bone thinning. (These are more common with higher doses or more frequent use. This is why we prefer that our patients avoid having any injection therapies in other practices.)  Very Rare: Severe mood changes; psychosis; aseptic necrosis. From procedure: Some discomfort is to be expected once the numbing medicine wears off. This should be minimal if ice and heat are applied as instructed.  Call if: (When should I call?) You experience numbness and weakness that gets worse with time, as opposed to wearing off. New onset bowel or bladder incontinence. (Applies only to procedures done in the spine)  Emergency Numbers: Durning business hours (Monday - Thursday, 8:00 AM - 4:00 PM) (Friday, 9:00 AM - 12:00 Noon): [phone] After hours: [phone] NOTE: If you are having a problem and are unable connect with, or to talk to a provider, then go to your nearest urgent care or emergency department. If the problem is serious and urgent, please call 911. ______________________________________________________________________     ______________________________________________________________________    Patient information on: Body mass index (BMI) and Weight Management  Dear Heather Mcintyre you are receiving this information because your weight may be adversely affecting your health.   Your current Estimated body mass index is 41.81 kg/m as calculated from the following:   Height as of 07/07/24: 5' 8 (1.727 m).   Weight as of 07/07/24: 275 lb (124.7 kg).  We recommend you talk to your primary care physician about providing or referring you to a supervised weight management program.  Here is some information about weight and the body mass index (BMI) classification:  BMI is a  measure of obesity that's calculated by dividing a person's weight in kilograms by their height in meters squared. A person can use an online calculator to determine their BMI. Body mass index (BMI) is a common tool for deciding whether a person has an appropriate body weight.  It measures a person's weight in relation to their height.  According to the [facility] of health (NIH): A BMI of less than 18.5 means that a person is underweight. A BMI of between 18.5 and 24.9 is ideal. A BMI of between 25 and 29.9 is overweight. A BMI over 30 indicates obesity.  Body Mass Index (BMI) Classification BMI level (kg/m2) Category Associated incidence of chronic pain  <18  Underweight   18.5-24.9 Ideal body weight   25-29.9 Overweight  20%  30-34.9 Obese (Class I)  68%  35-39.9 Severe obesity (Class II)  136%  >40 Extreme obesity (Class III)  254%    Morbidly Obese Classification: You will be considered to be Morbidly Obese if your BMI is above 30 and you have one or more of the following conditions caused or associated to obesity: 1.    Type 2 Diabetes (Leading to cardiovascular diseases (CVD), stroke, peripheral vascular diseases (PVD), retinopathy, nephropathy, and neuropathy) 2.    Cardiovascular Disease (High Blood Pressure; Congestive Heart Failure; High Cholesterol; Coronary Artery Disease; Angina; Arrhythmias, Dysrhythmias, or Heart Attacks) 3.    Breathing problems (Asthma; obesity-hypoventilation syndrome; obstructive sleep apnea; chronic inflammatory airway disease; reactive airway disease; or shortness of  breath) 4.    Chronic kidney disease 5.    Liver disease (nonalcoholic fatty liver disease) 6.    High blood pressure 7.    Acid reflux (gastroesophageal reflux disease; heartburn) 8.    Osteoarthritis (OA) (affecting the hip(s), the knee(s) and/or the lower back) (usually requiring knee and/or hip replacements, as well as back surgeries) 9.    Low back pain (Lumbar Facet  Syndrome; and/or Degenerative Disc Disease) 10.  Hip pain (Osteoarthritis of hip) (For every 1 lbs of added body weight, there is a 2 lbs increase in pressure inside of each hip articulation. 1:2 mechanical relationship) 11.  Knee pain (Osteoarthritis of knee) (For every 1 lbs of added body weight, there is a 4 lbs increase in pressure inside of each knee articulation. 1:4 mechanical relationship) (patients with a BMI>30 kg/m2 were 6.8 times more likely to develop knee OA than normal-weight individuals) 12.  Cancer: Epidemiological studies have shown that obesity is a risk factor for: post-menopausal breast cancer; cancers of the endometrium, colon and kidney cancer; malignant adenomas of the esophagus. Obese subjects have an approximately 1.5-3.5-fold increased risk of developing these cancers compared with normal-weight subjects, and it has been estimated that between 15 and 45% of these cancers can be attributed to overweight. More recent studies suggest that obesity may also increase the risk of other types of cancer, including pancreatic, hepatic and gallbladder cancer. (Ref: Obesity and cancer. Pischon T, Nthlings U, [employer] H. Proc Nutr Soc. 2008 May;67(2):128-45. doi: 10.1017/S0029665108006976.) The International Agency for Research on Cancer (IARC) has identified 13 cancers associated with overweight and obesity: meningioma, multiple myeloma, adenocarcinoma of the esophagus, and cancers of the thyroid, postmenopausal breast cancer, gallbladder, stomach, liver, pancreas, kidney, ovaries, uterus, colon and rectal (colorectal) cancers. 55 percent of all cancers diagnosed in women and 24 percent of those diagnosed in men are associated with overweight and obesity.  Recommendation: If you have any of the above conditions it is urgent that you take a step back and concentrate in losing weight. Dedicate 100% of your efforts on this task. Nothing else will improve your health more than bringing your weight  down and your BMI to less than 30.   Nutritionist and/or supervised weight-management program: We are aware that most chronic pain patients are unable to exercise secondary to their pain. For this reason, you must rely on proper nutrition and diet in order to lose the weight. We recommend you talk to a nutritionist.   Bariatric surgery: A person might be considered a candidate for bariatric surgery if they meet one of the following BMI criteria:  BMI of 40 or higher: This is considered extreme obesity (Class III). BMI of 35-39.9: This is considered obesity, and the person might also have a serious weight-related health condition, such as high blood pressure, type 2 diabetes, or severe sleep apnea  BMI of 30-34.9: This might be considered if the person has serious weight-related health problems and hasn't had substantial weight loss or improvement in co-morbidities through other methods   On your own: A realistic goal is to lose 10% of your body weight over a period of 12 months.  If over a period of six (6) months you have unsuccessfully tried to lose weight, then it is time for you to seek professional help and to enter a medically supervised weight management program, and/or undergo bariatric surgery.   Pain management considerations and possible limitations:  1.    Pharmacological Problems: Be advised that the use of  opioid analgesics (oxycodone ; hydrocodone; morphine; methadone; codeine; and all of their derivatives) have been associated with decreased metabolism and weight gain.  For this reason, should we see that you are unable to lose weight while taking these medications, it may become necessary for us  to taper down and indefinitely discontinue them.  2.    Technical Problems: The incidence of successful interventional therapies decreases as the patient's BMI increases. It is much more difficult to accomplish a safe and effective interventional therapy on a patient with a BMI above 35. 3.     Radiation Exposure Problems: The x-rays machine, used to accomplish injection therapies, will automatically increase their x-ray output in order to capture an appropriate bone image. This means that radiation exposure increases exponentially with the patient's BMI. (The higher the BMI, the higher the radiation exposure.) Although the level of radiation used at a given time is still safe to the patient, it is not for the physician and/or assisting staff. Unfortunately, radiation exposure is accumulative. Because physicians and the staff have to do procedures and be exposed on a daily basis, this can result in health problems such as cancer and radiation burns. Radiation exposure to the staff is monitored by the radiation batches that they wear. The exposure levels are reported back to the staff on a quarterly basis. Depending on levels of exposure, physicians and staff may be obligated by law to decrease this exposure. This means that they have the right and obligation to refuse providing therapies where they may be overexposed to radiation. For this reason, physicians may decline to offer therapies such as radiofrequency ablation or implants to patients with a BMI above 40. 4.    Current Trends: Be advised that the current trend is to no longer offer certain therapies to patients with a BMI equal to, or above 35, due to increase perioperative risks, increased technical procedural difficulties, and excessive radiation exposure to healthcare personnel.  Last updated: 08/20/2023 ______________________________________________________________________

## 2024-07-30 ENCOUNTER — Telehealth: Payer: Self-pay | Admitting: *Deleted

## 2024-07-30 NOTE — Telephone Encounter (Signed)
 Post procedure call; voicemail left

## 2024-08-06 ENCOUNTER — Telehealth: Admitting: Oncology

## 2024-08-12 ENCOUNTER — Ambulatory Visit: Admitting: Sleep Medicine

## 2024-08-13 ENCOUNTER — Encounter: Payer: Self-pay | Admitting: Pain Medicine

## 2024-08-13 ENCOUNTER — Ambulatory Visit: Attending: Pain Medicine | Admitting: Pain Medicine

## 2024-08-13 VITALS — BP 185/84 | HR 61 | Temp 97.3°F | Ht 68.0 in | Wt 274.0 lb

## 2024-08-13 DIAGNOSIS — G8929 Other chronic pain: Secondary | ICD-10-CM | POA: Diagnosis present

## 2024-08-13 DIAGNOSIS — M4316 Spondylolisthesis, lumbar region: Secondary | ICD-10-CM | POA: Diagnosis not present

## 2024-08-13 DIAGNOSIS — M545 Low back pain, unspecified: Secondary | ICD-10-CM | POA: Insufficient documentation

## 2024-08-13 DIAGNOSIS — M5386 Other specified dorsopathies, lumbar region: Secondary | ICD-10-CM | POA: Diagnosis present

## 2024-08-13 DIAGNOSIS — M79605 Pain in left leg: Secondary | ICD-10-CM | POA: Diagnosis not present

## 2024-08-13 DIAGNOSIS — M47816 Spondylosis without myelopathy or radiculopathy, lumbar region: Secondary | ICD-10-CM | POA: Insufficient documentation

## 2024-08-13 DIAGNOSIS — R519 Headache, unspecified: Secondary | ICD-10-CM | POA: Diagnosis present

## 2024-08-13 DIAGNOSIS — M5459 Other low back pain: Secondary | ICD-10-CM | POA: Diagnosis not present

## 2024-08-13 DIAGNOSIS — G4486 Cervicogenic headache: Secondary | ICD-10-CM | POA: Insufficient documentation

## 2024-08-13 DIAGNOSIS — M47817 Spondylosis without myelopathy or radiculopathy, lumbosacral region: Secondary | ICD-10-CM | POA: Diagnosis present

## 2024-08-13 DIAGNOSIS — M542 Cervicalgia: Secondary | ICD-10-CM | POA: Insufficient documentation

## 2024-08-13 DIAGNOSIS — M47812 Spondylosis without myelopathy or radiculopathy, cervical region: Secondary | ICD-10-CM | POA: Insufficient documentation

## 2024-08-13 DIAGNOSIS — M5030 Other cervical disc degeneration, unspecified cervical region: Secondary | ICD-10-CM | POA: Insufficient documentation

## 2024-08-13 DIAGNOSIS — R937 Abnormal findings on diagnostic imaging of other parts of musculoskeletal system: Secondary | ICD-10-CM | POA: Insufficient documentation

## 2024-08-13 NOTE — Patient Instructions (Signed)
 ______________________________________________________________________    Procedure instructions  Stop blood-thinners  Do not eat or drink fluids (other than water ) for 6 hours before your procedure  No water  for 2 hours before your procedure  Take your blood pressure medicine with a sip of water   Arrive 30 minutes before your appointment  If sedation is planned, bring suitable driver. [place], [place], & public transportation are NOT APPROVED)  Carefully read the Preparing for your procedure detailed instructions  If you have questions call us  at [phone]  Procedure appointments are for procedures only.   NO medication refills or new problem evaluations will be done on procedure days.   Only the scheduled, pre-approved procedure and side will be done.   ______________________________________________________________________     ______________________________________________________________________    Preparing for your procedure  Appointments: If you think you may not be able to keep your appointment, call 24-48 hours in advance to cancel. We need time to make it available to others.  Procedure visits are for procedures only. During your procedure appointment there will be: NO Prescription Refills*. NO medication changes or discussions*. NO discussion of disability issues*. NO unrelated pain problem evaluations*. NO evaluations to order other pain procedures*. *These will be addressed at a separate and distinct evaluation encounter on the provider's evaluation schedule and not during procedure days.  Instructions: Food intake: Avoid eating anything solid for at least 8 hours prior to your procedure. Clear liquid intake: You may take clear liquids such as water  up to 2 hours prior to your procedure. (No carbonated drinks. No soda.) Transportation: Unless otherwise stated by your physician, bring a driver. (Driver cannot be a Market researcher, Pharmacist, community, or any other form of public  transportation.) Morning Medicines: Except for blood thinners, take all of your other morning medications with a sip of water . Make sure to take your heart and blood pressure medicines. If your blood pressure's lower number is above 100, the case will be rescheduled. Blood thinners: Make sure to stop your blood thinners as instructed.  If you take a blood thinner, but were not instructed to stop it, call our office [phone] and ask to talk to a nurse. Not stopping a blood thinner prior to certain procedures could lead to serious complications. Diabetics on insulin : Notify the staff so that you can be scheduled 1st case in the morning. If your diabetes requires high dose insulin , take only  of your normal insulin  dose the morning of the procedure and notify the staff that you have done so. Preventing infections: Shower with an antibacterial soap the morning of your procedure.  Build-up your immune system: Take 1000 mg of Vitamin C with every meal (3 times a day) the day prior to your procedure. Antibiotics: Inform the nursing staff if you are taking any antibiotics or if you have any conditions that may require antibiotics prior to procedures. (Example: recent joint implants)   Pregnancy: If you are pregnant make sure to notify the nursing staff. Not doing so may result in injury to the fetus, including death.  Sickness: If you have a cold, fever, or any active infections, call and cancel or reschedule your procedure. Receiving steroids while having an infection may result in complications. Arrival: You must be in the facility at least 30 minutes prior to your scheduled procedure. Tardiness: Your scheduled time is also the cutoff time. If you do not arrive at least 15 minutes prior to your procedure, you will be rescheduled.  Children: Do not bring any children with  you. Make arrangements to keep them home. Dress appropriately: There is always a possibility that your clothing may get soiled. Avoid  long dresses. Valuables: Do not bring any jewelry or valuables.  Reasons to call and reschedule or cancel your procedure: (Following these recommendations will minimize the risk of a serious complication.) Surgeries: Avoid having procedures within 2 weeks of any surgery. (Avoid for 2 weeks before or after any surgery). Flu Shots: Avoid having procedures within 2 weeks of a flu shots or . (Avoid for 2 weeks before or after immunizations). Barium: Avoid having a procedure within 7-10 days after having had a radiological study involving the use of radiological contrast. (Myelograms, Barium swallow or enema study). Heart attacks: Avoid any elective procedures or surgeries for the initial 6 months after a Myocardial Infarction (Heart Attack). Blood thinners: It is imperative that you stop these medications before procedures. Let us  know if you if you take any blood thinner.  Infection: Avoid procedures during or within two weeks of an infection (including chest colds or gastrointestinal problems). Symptoms associated with infections include: Localized redness, fever, chills, night sweats or profuse sweating, burning sensation when voiding, cough, congestion, stuffiness, runny nose, sore throat, diarrhea, nausea, vomiting, cold or Flu symptoms, recent or current infections. It is specially important if the infection is over the area that we intend to treat. Heart and lung problems: Symptoms that may suggest an active cardiopulmonary problem include: cough, chest pain, breathing difficulties or shortness of breath, dizziness, ankle swelling, uncontrolled high or unusually low blood pressure, and/or palpitations. If you are experiencing any of these symptoms, cancel your procedure and contact your primary care physician for an evaluation.  Remember:  Regular Business hours are:  Monday to Thursday 8:00 AM to 4:00 PM  Provider's Schedule: Eric Como, MD:  Procedure days: Tuesday and Thursday 7:30  AM to 4:00 PM  Wallie Sherry, MD:  Procedure days: Monday and Wednesday 7:30 AM to 4:00 PM Last  Updated: 11/06/2023 ______________________________________________________________________     ______________________________________________________________________    General Risks and Possible Complications  Patient Responsibilities: It is important that you read this as it is part of your informed consent. It is our duty to inform you of the risks and possible complications associated with treatments offered to you. It is your responsibility as a patient to read this and to ask questions about anything that is not clear or that you believe was not covered in this document.  Patient's Rights: You have the right to refuse treatment. You also have the right to change your mind, even after initially having agreed to have the treatment done. However, under this last option, if you wait until the last second to change your mind, you may be charged for the materials used up to that point.  Introduction: Medicine is not an Visual merchandiser. Everything in Medicine, including the lack of treatment(s), carries the potential for danger, harm, or loss (which is by definition: Risk). In Medicine, a complication is a secondary problem, condition, or disease that can aggravate an already existing one. All treatments carry the risk of possible complications. The fact that a side effects or complications occurs, does not imply that the treatment was conducted incorrectly. It must be clearly understood that these can happen even when everything is done following the highest safety standards.  No treatment: You can choose not to proceed with the proposed treatment alternative. The "PRO(s)" would include: avoiding the risk of complications associated with the therapy. The "CON(s)" would include:  not getting any of the treatment benefits. These benefits fall under one of three categories: diagnostic; therapeutic; and/or  palliative. Diagnostic benefits include: getting information which can ultimately lead to improvement of the disease or symptom(s). Therapeutic benefits are those associated with the successful treatment of the disease. Finally, palliative benefits are those related to the decrease of the primary symptoms, without necessarily curing the condition (example: decreasing the pain from a flare-up of a chronic condition, such as incurable terminal cancer).  General Risks and Complications: These are associated to most interventional treatments. They can occur alone, or in combination. They fall under one of the following six (6) categories: no benefit or worsening of symptoms; bleeding; infection; nerve damage; allergic reactions; and/or death. No benefits or worsening of symptoms: In Medicine there are no guarantees, only probabilities. No healthcare provider can ever guarantee that a medical treatment will work, they can only state the probability that it may. Furthermore, there is always the possibility that the condition may worsen, either directly, or indirectly, as a consequence of the treatment. Bleeding: This is more common if the patient is taking a blood thinner, either prescription or over the counter (example: Goody Powders, Fish oil, Aspirin, Garlic, etc.), or if suffering a condition associated with impaired coagulation (example: Hemophilia, cirrhosis of the liver, low platelet counts, etc.). However, even if you do not have one on these, it can still happen. If you have any of these conditions, or take one of these drugs, make sure to notify your treating physician. Infection: This is more common in patients with a compromised immune system, either due to disease (example: diabetes, cancer, human immunodeficiency virus [HIV], etc.), or due to medications or treatments (example: therapies used to treat cancer and rheumatological diseases). However, even if you do not have one on these, it can still  happen. If you have any of these conditions, or take one of these drugs, make sure to notify your treating physician. Nerve Damage: This is more common when the treatment is an invasive one, but it can also happen with the use of medications, such as those used in the treatment of cancer. The damage can occur to small secondary nerves, or to large primary ones, such as those in the spinal cord and brain. This damage may be temporary or permanent and it may lead to impairments that can range from temporary numbness to permanent paralysis and/or brain death. Allergic Reactions: Any time a substance or material comes in contact with our body, there is the possibility of an allergic reaction. These can range from a mild skin rash (contact dermatitis) to a severe systemic reaction (anaphylactic reaction), which can result in death. Death: In general, any medical intervention can result in death, most of the time due to an unforeseen complication. ______________________________________________________________________      ______________________________________________________________________    Steroid injections  Common steroids for injections Triamcinolone: Used by many sports medicine physicians for large joint and bursal injections, often combined with a local anesthetic like lidocaine . A study focusing on coccydynia (tailbone pain) found triamcinolone was more effective than betamethasone , suggesting it may also be preferable for other localized inflammation conditions. Methylprednisolone: A common alternative to triamcinolone that is also a strong anti-inflammatory. It is available in different formulations, with the acetate suspension being the long-acting option for intra-articular injections. Dexamethasone : This is a non-particulate steroid, meaning it has a lower risk of tissue damage compared to particulate steroids like triamcinolone and methylprednisolone. While less common for this specific  use,  it is an option for targeted injections.   Considerations for physicians Particulate vs. non-particulate steroids: Triamcinolone and methylprednisolone are particulate, meaning they can clump together. Dexamethasone  is non-particulate. Particulate steroids are often preferred for their longer-lasting effects but carry a theoretical higher risk for certain injections (though this is less of a concern in the costochondral joints). Combined injectate: Corticosteroids are typically mixed with a local anesthetic like lidocaine  to provide both immediate pain relief (from the anesthetic) and longer-term inflammation reduction (from the steroid). Imaging guidance: To ensure accurate placement of the needle and medication, physicians may use ultrasound or fluoroscopic guidance for the injection, especially in complex or refractory cases.   Patient guidance Before undergoing a steroid injection, discuss the options with your physician. They will determine the best steroid, dosage, and procedure for your specific case based on factors like: Severity of your condition History of response to other treatments Your overall health status Experience and preference of the physician  Last  Updated: 07/22/2024 ______________________________________________________________________

## 2024-08-13 NOTE — Progress Notes (Signed)
 Safety precautions to be maintained throughout the outpatient stay will include: orient to surroundings, keep bed in low position, maintain call bell within reach at all times, provide assistance with transfer out of bed and ambulation.

## 2024-08-13 NOTE — Progress Notes (Signed)
 PROVIDER NOTE: Interpretation of information contained herein should be left to medically-trained personnel. Specific patient instructions are provided elsewhere under Patient Instructions section of medical record. This document was created in part using AI and STT-dictation technology, any transcriptional errors that may result from this process are unintentional.  Patient: Heather Mcintyre  Service: E/M   PCP: Lorel Maxie LABOR, MD  DOB: 1966-06-25  DOS: 08/13/2024  Provider: Eric LABOR Como, MD  MRN: [id number]  Delivery: Face-to-face  Specialty: Interventional Pain Management  Type: Established Patient  Setting: Ambulatory outpatient facility  Specialty designation: 09  Referring Prov.: Lorel Maxie LABOR, MD  Location: Outpatient office facility       History of present illness (HPI) Ms. Heather Mcintyre, a 58 y.o. year old female, is here today because of her Chronic bilateral low back pain without sciatica [M54.50, G89.29]. 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 [facility]); Malignant neoplasm of left breast in female, estrogen receptor positive (HCC); Spondylolisthesis, lumbar region; Arthropathy of lumbar facet joint; Lumbar spondylosis; Chronic joint pain; History of knee replacement (Bilateral); Chronic low back pain (1ry area of Pain) (Bilateral) (L>R) w/o sciatica; Osteoarthritis; Osteoarthritis of ankle; Pes planovalgus, acquired (Left); Pes planovalgus, acquired (Right); Radiculopathy of lumbar region; Sacroiliac joint pain; Pain in joint of shoulder (Left); Chronic pain syndrome; Abnormal MRI, lumbar spine (03/14/2019); Lumbar facet hypertrophy (Multilevel) (Bilateral); Lumbar foraminal stenosis (Multilevel) (Bilateral); Lumbosacral foraminal stenosis (Severe) (L5-S1) (Bilateral); Grade 1 Anterolisthesis (9 mm) of lumbar spine (L4/L5); Osteoarthritis of facet joint of lumbar spine;  Osteoarthritis of lumbar spine; Lumbosacral facet arthropathy; Chronic lower extremity pain (2ry area of Pain) (Bilateral); Chronic hip pain (3ry area of Pain) (Bilateral); Chronic knee pain (4th area of Pain) (Bilateral); Chronic ankle pain (5th area of Pain) (Bilateral); Chronic neck pain (6th area of Pain) (Bilateral) (L>R); Lumbar facet joint pain; Lumbar facet joint syndrome; Spondylosis without myelopathy or radiculopathy, lumbosacral region; Chronic low back pain (Bilateral) w/ sciatica (Left); Chronic lower extremity pain (Left); Cervical facet hypertrophy; DDD (degenerative disc disease), cervical; Cervical facet joint pain (Left); Cervicalgia; Cervicogenic headache (Left); Occipital headache (greater occipital) (Left); Low back pain of over 3 months duration; Multifactorial low back pain; 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  Pain Assessment: Severity of Chronic pain is reported as a 6 /10. Location: Back Right, Lower/radiates to right hip to back of mid thigh. Onset: More than a month ago. Quality: Sore, Tender. Timing: Constant. Modifying factor(s): procedures helped left side; meds. Vitals:  height is 5' 8 (1.727 m) and weight is 274 lb (124.3 kg). Her temperature is 97.3 F (36.3 C) (abnormal). Her blood pressure is 185/84 (abnormal) and her pulse is 61. Her oxygen saturation is 99%.  BMI: Estimated body mass index is 41.66 kg/m as calculated from the following:   Height as of this encounter: 5' 8 (1.727 m).   Weight as of this encounter: 274 lb (124.3 kg).  Last encounter: 07/07/2024. Last procedure: 07/29/2024.  Reason for encounter: post-procedure evaluation and assessment.   Discussed the use of AI scribe software for clinical note transcription with the patient, who gave verbal consent to proceed.  History of Present Illness   Heather Mcintyre is a 58 year old female who presents for  post-procedure evaluation following a lumbar epidural steroid injection.  On September 25, she underwent  a left-sided L2-3 lumbar epidural steroid injection under fluoroscopic guidance and IV sedation. Her initial pain level was 5/10, which reduced to 0/10 post-procedure. She experienced 100% pain relief during the local anesthetic phase, decreasing to 80% and then to 50%.  She currently has no leg pain but experiences a sensation of her left leg 'going to sleep.' There is new tenderness on the right side, improved by 50-75%, localized without significant radiation.  She has an allergy  to contrast dye, which previously caused a severe reaction requiring intensive care. She avoids contrast due to this allergy .  She has facet joint hypertrophy and arthritis from T12-L1 to L5-S1, as noted in a 2020 MRI. She reports neck pain from the back of her neck to the right side, persisting for two months, with limited physical therapy due to pain.      Post-Procedure Evaluation   Type: Lumbar epidural steroid injection (LESI) (interlaminar) #1    Laterality: Left   Level:  L2-3 Level.  Imaging: Fluoroscopic guidance Spinal ([id number]) Anesthesia: Local anesthesia (1-2% Lidocaine ) Anxiolysis: IV Versed  2.0 mg Sedation: Minimal Sedation None required. No Fentanyl  administered.         DOS: 07/29/2024  Performed by: Eric DELENA Como, MD  Purpose: Diagnostic/Therapeutic Indications: Lumbar radicular pain of intraspinal etiology of more than 4 weeks that has failed to respond to conservative therapy and is severe enough to impact quality of life or function. 1. 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 Chronic lower extremity pain (Left)   3. Grade 1 Anterolisthesis of lumbar spine (L4/L5)   4. Radiculopathy of lumbar region   5. Spondylolisthesis, lumbar region   6. Abnormal MRI, lumbar spine (03/14/2019)   7. History of allergy  to latex   8. History of allergy  to radiographic  contrast media   9. Latex precautions, history of latex allergy    10. Morbid obesity with BMI of 40.0-44.9, adult (HCC)   11. At high risk for allergic reaction to latex    NAS-11 Pain score:   Pre-procedure: 5 /10   Post-procedure: 0-No pain/10     Effectiveness:  Initial hour after procedure: 100 %. Subsequent 4-6 hours post-procedure: 80 %. Analgesia past initial 6 hours: 50 %. Ongoing improvement:  Analgesic: The patient indicates about a 100% relief of the pain for the duration of the local anesthetic followed by a decrease to an 80% improvement as the local anesthetic started wearing off and then down to a 50% improvement which after a couple of days went up to an ongoing 80% improvement of her pain.  The patient indicates no longer having any of the left lower extremity symptoms and she does have a little bit of discomfort on the right side but the pain is primarily through the back of the leg and what appears to be a referred pattern from the lumbar facet joint.  In addition the patient indicates her primary pain to be that of the lower back with the leg component being secondary.  She also describes the lower back pain as being constant while the leg pain seems to be intermittent. Function: Somewhat improved ROM: Somewhat improved  Pharmacotherapy Assessment   Opioid Analgesic:  No chronic opioid analgesics therapy prescribed by our practice. None. MME/day: 0 mg/day   Monitoring: [place] PMP: PDMP reviewed during this encounter.       Pharmacotherapy: No side-effects or adverse reactions reported. Compliance: No problems identified. Effectiveness: Clinically acceptable.  Heather Nathanel PARAS, RN  08/13/2024 11:35 AM  Sign when Signing Visit Safety precautions to be maintained throughout the outpatient stay will include: orient to surroundings, keep bed in low position, maintain call bell within reach at all times, provide assistance with transfer out of bed and ambulation.     UDS:   Summary  Date Value Ref Range Status  12/17/2023 FINAL  Final    Comment:    ==================================================================== Compliance Drug Analysis, Ur ==================================================================== Test                             Result       Flag       Units  Drug Present and Declared for Prescription Verification   Gabapentin                      PRESENT      EXPECTED   Acetaminophen                   PRESENT      EXPECTED   Diphenhydramine                PRESENT      EXPECTED   Hydroxyzine                     PRESENT      EXPECTED   Metoprolol                      PRESENT      EXPECTED  Drug Absent but Declared for Prescription Verification   Tizanidine                      Not Detected UNEXPECTED    Tizanidine , as indicated in the declared medication list, is not    always detected even when used as directed.  ==================================================================== Test                      Result    Flag   Units      Ref Range   Creatinine              145              mg/dL      >=79 ==================================================================== Declared Medications:  The flagging and interpretation on this report are based on the  following declared medications.  Unexpected results may arise from  inaccuracies in the declared medications.   **Note: The testing scope of this panel includes these medications:   Diphenhydramine (Benadryl)  Gabapentin   Hydroxyzine  (Atarax )  Metoprolol  (Toprol )   **Note: The testing scope of this panel does not include small to  moderate amounts of these reported medications:   Acetaminophen   Tizanidine  (Zanaflex )   **Note: The testing scope of this panel does not include the  following reported medications:   Aloe Vera  Anastrozole  (Arimidex )  Celecoxib  (Celebrex )  Cetirizine  (Zyrtec )  Colchicine   Fluticasone  (Flonase )  Furosemide (Lasix)  Indomethacin  (Indocin )   Linaclotide  (Linzess )  Montelukast  (Singulair )  Multivitamin  Omeprazole (Prilosec)  Vitamin C  ==================================================================== For clinical consultation, please call [phone]. ====================================================================     No results found for: CBDTHCR No results found for: D8THCCBX No results found for: D9THCCBX  ROS  Constitutional: Denies any fever or chills Gastrointestinal: No reported hemesis, hematochezia, vomiting, or acute GI distress Musculoskeletal: Denies any acute onset joint swelling, redness, loss of ROM, or weakness Neurological: No reported episodes  of acute onset apraxia, aphasia, dysarthria, agnosia, amnesia, paralysis, loss of coordination, or loss of consciousness  Medication Review  Aloe Vera, B Complex Vitamins, EPINEPHrine , Insulin Pen Needle, Lifitegrast, acetaminophen , albuterol, anastrozole , ascorbic acid , azelastine , carvedilol , celecoxib , cetirizine , colchicine , desonide , diphenhydrAMINE, fluticasone , furosemide, gabapentin , hydrOXYzine , irbesartan , levocetirizine, linaclotide , metoprolol  succinate, montelukast , multivitamin-lutein, omeprazole, pimecrolimus , rosuvastatin , tiZANidine , and traZODone   History Review  Allergy : Heather Mcintyre is allergic to penicillin g, cephalexin, etodolac, iodinated contrast media, latex, fish-derived products, hydrochlorothiazide , iodine, penicillins, shellfish allergy , and lisinopril. Drug: Heather Mcintyre  reports no history of drug use. Alcohol:  reports no history of alcohol use. Tobacco:  reports that she has never smoked. She has never been exposed to tobacco smoke. She has never used smokeless tobacco. Social: Heather Mcintyre  reports that she has never smoked. She has never been exposed to tobacco smoke. She has never used smokeless tobacco. She reports that she does not drink alcohol and does not use drugs. Medical:  has a past medical history of  Anxiety, Asthma, Breast cancer (HCC), Cervical cancer (HCC), Complication of anesthesia, Degenerative joint disease/osteoarthritis, Depression, GERD (gastroesophageal reflux disease), Hypertension, Lupus, and Pre-diabetes. 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 Family: family history includes Hypertension in her father and mother.  Laboratory Chemistry Profile   Renal Lab Results  Component Value Date   BUN 19 03/20/2024   CREATININE 0.81 03/20/2024   BCR 23 03/20/2024   GFRAA >60 02/10/2017   GFRNONAA >60 12/27/2023    Hepatic Lab Results  Component Value Date   AST 21 03/20/2024   ALT 15 03/20/2024   ALBUMIN  3.9 03/20/2024   ALKPHOS 83 03/20/2024    Electrolytes Lab Results  Component Value Date   NA 142 03/20/2024   K 4.3 03/20/2024   CL 105 03/20/2024   CALCIUM  9.7 03/20/2024   MG 1.8 01/09/2024    Bone Lab Results  Component Value Date   25OHVITD1 26 (L) 01/09/2024   25OHVITD2 <1.0 01/09/2024   25OHVITD3 26 01/09/2024    Inflammation (CRP: Acute Phase) (ESR: Chronic Phase) Lab Results  Component Value Date   CRP 2 01/09/2024   ESRSEDRATE 31 01/09/2024         Note: Above Lab results reviewed.  Recent Imaging Review  DG PAIN CLINIC C-ARM 1-60 MIN NO REPORT Fluoro was used, but no Radiologist interpretation will be provided.  Please refer to NOTES tab for provider progress note. Note: Reviewed        Physical Exam  Vitals: BP (!) 185/84 Comment: pt states she did not take BP meds this am; pt will take and monitor  BP and will call PCP if still elevated; Dr tanya notified  Pulse 61   Temp (!) 97.3 F (36.3 C)   Ht 5' 8 (1.727 m)   Wt 274 lb (124.3 kg)   SpO2 99%   BMI 41.66 kg/m  BMI: Estimated body mass index is 41.66 kg/m as calculated from the following:   Height as of this encounter: 5' 8 (1.727 m).   Weight as of this encounter: 274 lb (124.3 kg). Ideal: Ideal body weight: 63.9 kg (140 lb 14 oz) Adjusted ideal body weight: 88.1 kg (194 lb 2 oz) General appearance: Well  nourished, well developed, and well hydrated. In no apparent acute distress Mental status: Alert, oriented x 3 (person, place, & time)       Respiratory: No evidence of acute respiratory distress Eyes: PERLA   Assessment   Diagnosis Status  1. Chronic low back pain (1ry area of Pain) (Bilateral) (L>R) w/o sciatica   2. Chronic lower extremity pain (Left)   3. Lumbar facet joint pain   4. Grade 1 Anterolisthesis (9 mm) of lumbar spine (L4/L5)   5. Lumbar facet hypertrophy (Multilevel) (Bilateral)   6. Lumbosacral facet arthropathy   7. Lumbar facet joint syndrome   8. Spondylosis without myelopathy or radiculopathy, lumbosacral region   9. Low back pain of over 3 months duration   10. Multifactorial low back pain   11. Osteoarthritis of facet joint of lumbar spine   12. Decreased range of motion of lumbar spine   13. Abnormal MRI, lumbar spine (03/14/2019)   14. Chronic neck pain (6th area of Pain) (Bilateral) (L>R)   15. Cervicalgia   16. Cervical facet hypertrophy   17. Cervical facet joint pain (Left)   18. 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 Cervicogenic headache (Left)   20. Occipital headache (greater occipital) (Left)   21. DDD (degenerative disc disease), cervical    Improving Resolved Persistent   Updated Problems: Problem  Cervical facet hypertrophy  Ddd (Degenerative Disc Disease), Cervical  Cervical facet joint pain (Left)  Cervicalgia  Cervicogenic headache (Left)   Occipital headache (greater occipital) (Left)  Low Back Pain of Over 3 Months Duration  Multifactorial Low Back Pain  Osteoarthritis of Facet Joint of Cervical Spine  Decreased Range of Motion of Lumbar Spine  Chronic neck pain (6th area of Pain) (Bilateral) (L>R)    Plan of Care  Problem-specific:  Assessment and Plan    Lumbar facet joint arthropathy with back pain and bilateral facet-mediated pain   Chronic lumbar facet joint arthropathy is likely causing pain from the facet joints, as evidenced by limited range of motion and pain during hyperextension and rotation. An MRI from 2020 shows facet joint hypertrophy from T12-L1 to L5-S1, indicating long-standing arthritis. Insurance may limit treatment to blocking two or three levels at a time. Diagnostic facet joint blocks are planned to confirm the pain source and assess for radiofrequency ablation if pain relief is achieved. She may be part of the 28% of the population with a membrane dividing the canal, possibly requiring treatment on both sides. Order diagnostic facet joint blocks with sedation and administer steroids. Evaluate pain relief duration post-blocks to differentiate between inflammation and mechanical pain. Consider radiofrequency ablation if diagnostic blocks confirm facet joint pain and insurance criteria are met. Advise weight loss to improve treatment success and recommend consulting primary care for weight management and potential endocrine evaluation.  Cervical facet joint arthropathy with left-sided neck pain and occipital headache   Chronic cervical facet joint arthropathy is likely causing left-sided neck pain and occipital headache, possibly due to facet joint involvement affecting the greater occipital nerve. Symptoms include pain from the neck to the occipital region, with occasional cracking and popping sounds. No recent imaging is available to assess the current status. Order cervical spine x-rays with flexion and  extension and an MRI of the cervical spine. Evaluate CT scan results for neck pressure and consider facet joint involvement if the CT scan is inconclusive.  Left lumbar radiculopathy, improved post-epidural steroid injection   Left lumbar radiculopathy has significantly improved post-epidural  steroid injection. Initial 100% pain relief with local anesthetic was followed by 80% and then 50% relief. No current leg pain is reported, indicating effective treatment of radiculopathy. Discussed the potential need to address the right side if symptoms persist, considering the possibility of the membrane affecting medication spread.      Heather Mcintyre has a current medication list which includes the following long-term medication(s): azelastine , carvedilol , cetirizine , diphenhydramine, fluticasone , furosemide, gabapentin , irbesartan , levocetirizine, linaclotide , montelukast , omeprazole, rosuvastatin , trazodone , and ventolin hfa.  Pharmacotherapy (Medications Ordered): No orders of the defined types were placed in this encounter.  Orders:  Orders Placed This Encounter  Procedures   LUMBAR FACET(MEDIAL BRANCH NERVE BLOCK) MBNB    Diagnosis: Lumbar Facet Syndrome (M47.816); Lumbosacral Facet Syndrome (M47.817); Lumbar Facet Joint Pain (M54.59) Medical Necessity Statement: 1.Severe chronic axial low back pain causing functional impairment documented by ongoing pain scale assessments. 2.Pain present for longer than 3 months (Chronic) documented to have failed noninvasive conservative therapies. 3.Absence of untreated radiculopathy. 4.There is no radiological evidence of untreated fractures, tumor, infection, or deformity.  Physical Examination Findings: Positive Kemp Maneuver: (Y)  Positive Lumbar Hyperextension-Rotation provocative test: (Y)    Standing Status:   Future    Expiration Date:   11/12/2024    Scheduling Instructions:     Procedure: Lumbar facet Block     Type: Medial Branch Block      Side: Bilateral     Purpose: Diagnostic Radiologic Mapping     Level(s): L3-4, L4-5, L5-S1, and TBD by Fluoroscopic Mapping Facets (L2, L3, L4, L5, S1, and TBD Medial Branch)     Sedation: With Sedation.     Timeframe: ASAP    Where will this procedure be performed?:   [facility]   MR LUMBAR SPINE WO CONTRAST    Patient presents with axial pain with possible radicular component. Please assist us  in identifying specific level(s) and laterality of any additional findings such as: 1. Facet (Zygapophyseal) joint DJD (Hypertrophy, space narrowing, subchondral sclerosis, and/or osteophyte formation) 2. DDD and/or IVDD (Loss of disc height, desiccation, gas patterns, osteophytes, endplate sclerosis, or Black disc disease) 3. Pars defects 4. Spondylolisthesis, spondylosis, and/or spondyloarthropathies (include Degree/Grade of displacement in mm) (stability) 5. Vertebral body Fractures (acute/chronic) (state percentage of collapse) 6. Demineralization (osteopenia/osteoporotic) 7. Bone pathology 8. Foraminal narrowing  9. Surgical changes 10. Central, Lateral Recess, and/or Foraminal Stenosis (include AP diameter of stenosis in mm) 11. Surgical changes (hardware type, status, and presence of fibrosis) 12. Modic Type Changes (MRI only) 13. IVDD (Disc bulge, protrusion, herniation, extrusion) (Level, laterality, extent)    Standing Status:   Future    Expiration Date:   11/12/2024    Scheduling Instructions:     Please make sure that the patient understands that this needs to be done as soon as possible. Never have the patient do the imaging just before the next appointment. Inform patient that having the imaging done within the [facility] will expedite the availability of the results and will provide      imaging availability to the requesting physician. In addition inform the patient that the imaging order has an expiration date and will not be renewed if not done within the  active period.    What is the patient's sedation requirement?:   No Sedation    Does the patient have a pacemaker or implanted devices?:   No    Preferred imaging location?:   [facility] (table limit-350lbs)    Call Results-  Best Contact Number?:   [phone] [place] [facility]    Radiology Contrast Protocol - do NOT remove file path:   [email or web address]   DG Cervical Spine With Flex & Extend    Patient presents with axial pain with possible radicular component.  Please evaluate for any evidence of cervical spine instability. Describe the presence of any spondylolisthesis (Antero- or retrolisthesis). If present, provide displacement Grade and measurement in cm. Please describe presence and specific location (Level & Laterality) of any signs of  osteoarthritis, zygapophyseal (Facet) joints DJD (including decreased joint space and/or osteophytosis), DDD, Foraminal narrowing, as well as any sclerosis and/or cyst formation. Please comment on ROM. Patient presents with axial pain with possible radicular component. Please assist us  in identifying specific level(s) and laterality of any additional findings such as: 1. Facet (Zygapophyseal) joint DJD (Hypertrophy, space narrowing, subchondral sclerosis, and/or osteophyte formation) 2. DDD and/or IVDD (Loss of disc height, desiccation, gas patterns, osteophytes, endplate sclerosis, or Black disc disease) 3. Pars defects 4. Spondylolisthesis, spondylosis, and/or spondyloarthropathies (include Degree/Grade of displacement in mm) (stability) 5. Vertebral body Fractures (acute/chronic) (state percentage of collapse) 6. Demineralization (osteopenia/osteoporotic) 7. Bone pathology 8. Foraminal narrowing  9. Surgical changes    Standing Status:   Future    Expected Date:   08/13/2024    Expiration Date:   11/12/2024    Scheduling Instructions:     Please make sure that  the patient understands that this needs to be done as soon as possible. Never have the patient do the imaging just before the next appointment. Inform patient that having the imaging done within the [facility] will expedite the availability of the results and will provide      imaging availability to the requesting physician. In addition inform the patient that the imaging order has an expiration date and will not be renewed if not done within the active period.    Reason for Exam (SYMPTOM  OR DIAGNOSIS REQUIRED):   Cervicalgia, chronic neck pain    Is patient pregnant?:   No    Preferred imaging location?:   [facility]    Call Results- Best Contact Number?:   [phone] [place] [facility]    Radiology Contrast Protocol - do NOT remove file path:   [email or web address]    Release to patient:   Immediate     Interventional Therapies  Risk Factors  Considerations  Medical Comorbidities:  ALLERGY : Contrast, Latex, Iodine, NSAIDS, PCN  MO (BMI>40)  GERD  HTN  Hx. Bradycardia     Planned  Pending:  (CONTRAST ALLERGY )  Diagnostic bilateral lumbar facet MBB #R1L2   Diagnostic lumbar MRI (last done on 03/14/2019) Diagnostic x-rays of the cervical spine with flexion and extension (symptoms have worsened since last imaging of cervical spine on 12/17/2023)  Pending CT of head ordered by her PCP.    Under consideration:   Diagnostic bilateral lumbar facet MBB #1  Diagnostic/therapeutic right L2-3 LESI #1    Completed:   Diagnostic/therapeutic left L2-3 LESI x1 (07/29/2024) (100/80/50/80) (lower extremity symptoms appear to be gone) Diagnostic left lumbar facet (L3-4, L4-5, and L5-S1) MBB x1 (06/24/2024) (50/50/75/75)    Therapeutic  Palliative (PRN) options:   None established   Completed by other providers:   None reported     Return for (ECT): (B) L-FCT Blk #R1L2.    Recent  Visits Date Type Provider Dept  07/29/24 Procedure visit Tanya Glisson, MD [facility]  07/07/24 Office Visit Tanya Glisson, MD [facility]  06/24/24 Procedure visit Tanya Glisson, MD [facility]  06/09/24 Office Visit Tanya Glisson, MD [facility]  Showing recent visits within past 90 days and meeting all other requirements Today's Visits Date Type Provider Dept  08/13/24 Office Visit Tanya Glisson, MD [facility]  Showing today's visits and meeting all other requirements Future Appointments No visits were found meeting these conditions. Showing future appointments within next 90 days and meeting all other requirements  I discussed the assessment and treatment plan with the patient. The patient was provided an opportunity to ask questions and all were answered. The patient agreed with the plan and demonstrated an understanding of the instructions.  Patient advised to call back or seek an in-person evaluation if the symptoms or condition worsens.  Duration of encounter: 44 minutes.  Total time on encounter, as per AMA guidelines included both the face-to-face and non-face-to-face time personally spent by the physician and/or other qualified health care professional(s) on the day of the encounter (includes time in activities that require the physician or other qualified health care professional and does not include time in activities normally performed by clinical staff). Physician's time may include the following activities when performed: Preparing to see the patient (e.g., pre-charting review of records, searching for previously ordered imaging, lab work, and nerve conduction tests) Review of prior analgesic pharmacotherapies. Reviewing PMP Interpreting ordered tests (e.g., lab work, imaging, nerve conduction tests) Performing post-procedure evaluations, including interpretation of diagnostic procedures Obtaining  and/or reviewing separately obtained history Performing a medically appropriate examination and/or evaluation Counseling and educating the patient/family/caregiver Ordering medications, tests, or procedures Referring and communicating with other health care professionals (when not separately reported) Documenting clinical information in the electronic or other health record Independently interpreting results (not separately reported) and communicating results to the patient/ family/caregiver Care coordination (not separately reported)  Note by: Glisson DELENA Tanya, MD (TTS and AI technology used. I apologize for any typographical errors that were not detected and corrected.) Date: 08/13/2024; Time: 12:58 PM

## 2024-08-15 ENCOUNTER — Ambulatory Visit
Admission: RE | Admit: 2024-08-15 | Discharge: 2024-08-15 | Disposition: A | Discharge status: Home / Self Care | Source: Ambulatory Visit | Attending: Family Medicine | Admitting: Family Medicine

## 2024-08-15 DIAGNOSIS — R519 Headache, unspecified: Secondary | ICD-10-CM | POA: Insufficient documentation

## 2024-08-18 ENCOUNTER — Encounter (HOSPITAL_BASED_OUTPATIENT_CLINIC_OR_DEPARTMENT_OTHER): Payer: Self-pay | Admitting: Cardiovascular Disease

## 2024-08-18 ENCOUNTER — Other Ambulatory Visit: Payer: Self-pay

## 2024-08-18 ENCOUNTER — Ambulatory Visit (INDEPENDENT_AMBULATORY_CARE_PROVIDER_SITE_OTHER): Admitting: Cardiovascular Disease

## 2024-08-18 VITALS — BP 168/88 | HR 68 | Resp 16 | Ht 68.0 in | Wt 279.0 lb

## 2024-08-18 DIAGNOSIS — Z131 Encounter for screening for diabetes mellitus: Secondary | ICD-10-CM

## 2024-08-18 DIAGNOSIS — I517 Cardiomegaly: Secondary | ICD-10-CM

## 2024-08-18 DIAGNOSIS — R001 Bradycardia, unspecified: Secondary | ICD-10-CM

## 2024-08-18 DIAGNOSIS — Z6841 Body Mass Index (BMI) 40.0 and over, adult: Secondary | ICD-10-CM

## 2024-08-18 DIAGNOSIS — I10 Essential (primary) hypertension: Secondary | ICD-10-CM | POA: Diagnosis not present

## 2024-08-18 DIAGNOSIS — C50912 Malignant neoplasm of unspecified site of left female breast: Secondary | ICD-10-CM

## 2024-08-18 MED ORDER — SPIRONOLACTONE 25 MG PO TABS: 25.0000 mg | ORAL_TABLET | Freq: Every day | ORAL | 3 refills | Status: DC

## 2024-08-18 NOTE — Progress Notes (Signed)
 Hypertension Clinic Follow Up:    Date:  08/18/2024   ID:  Heather Mcintyre, DOB Aug 29, 1966, MRN [id number]  PCP:  Heather Maxie LABOR, MD  Cardiologist:  Alm Clay, MD   Referring MD: Heather Maxie LABOR, MD   CC: Hypertension  History of Present Illness:    Heather Mcintyre is a 58 y.o. female with a hx of hypertension, obesity, OSA, and LVH here for follow up.  She was first seen in HTN clinic 04/2024.  She has worked with Dr. Clay and struggled to control her PB.  Renal Dopplers 12/2023 were nto diagnostic.  She has multiple medication intolerances. When she saw [facility] her BP was in the 180-200s.  Her ARB was increased and she continued carvedilol .    Discussed the use of AI scribe software for clinical note transcription with the patient, who gave verbal consent to proceed.  History of Present Illness Ms. Heather Mcintyre has a long-standing history of hypertension, with home blood pressure readings often ranging from 180/90 to 200/110, particularly during stress. Current medications include carvedilol  and irbesartan . Previous medications have included metoprolol , valsartan , lisinopril, hydrochlorothiazide , and losartan. She does not closely monitor her salt intake but does not add extra salt to her food. She consumes one caffeinated soda daily and does not drink alcohol or smoke. Family history includes hypertension in both parents.  Chronic back pain significantly impacts her daily activities, worsening with physical activity at work. Pain management includes gabapentin , Celebrex , and Tylenol . She has undergone epidural injections, initially affecting the left side but now more severe on the right. The pain also causes pressure on the left side of her head, distinct from a headache.  She has obstructive sleep apnea and uses a CPAP machine, though she finds it uncomfortable and experiences nasal dryness. Various masks have been tried without success.  She has struggled with obesity for most of  her life and is currently seeing a nutritionist at [employer]. Previous attempts with medications like Victoza and Byetta were unsuccessful. She experiences regurgitation with certain foods and regularly consumes prunes, nuts, and juices greens.  She experiences stress related to family and church dynamics, which she believes contributes to her high blood pressure. She has not seen a therapist recently but has in the past. She finds solace in her faith and spiritual practices.   Previous antihypertensives: Metoprolol  - changed to carvedilol  Valsartan  - trouble with dye Lisinopril - cough Hydrochlorothiazide  - itching  Amlodipine   - poor reaction Losartan - poor reaction  Past Medical History:  Diagnosis Date   Anxiety    Asthma    Breast cancer (HCC)    Cervical cancer (HCC)    Complication of anesthesia    woke up in the middle of surgery 2010   Degenerative joint disease/osteoarthritis    Depression    GERD (gastroesophageal reflux disease)    Hypertension    Lupus    Pre-diabetes     Past Surgical History:  Procedure Laterality Date   ABDOMINAL HYSTERECTOMY     BREAST BIOPSY Left 10/04/2022   US  LT BREAST BX W LOC DEV 1ST LESION IMG BX SPEC US  GUIDE 10/04/2022 [facility]   BREAST LUMPECTOMY,RADIO FREQ LOCALIZER,AXILLARY SENTINEL LYMPH NODE BIOPSY Left 11/07/2022   Procedure: BREAST LUMPECTOMY,RADIO FREQ LOCALIZER,AXILLARY SENTINEL LYMPH NODE BIOPSY;  Surgeon: Desiderio Schanz, MD;  Location: [facility];  Service: General;  Laterality: Left;   CHOLECYSTECTOMY N/A    COLONOSCOPY WITH ESOPHAGOGASTRODUODENOSCOPY (EGD)     INCISION AND DRAINAGE ABSCESS Left  04/11/2023   Procedure: INCISION AND DRAINAGE ABSCESS, breast;  Surgeon: Desiderio Schanz, MD;  Location: [facility];  Service: General;  Laterality: Left;   JOINT REPLACEMENT     LAPAROSCOPIC GASTRIC BAND REMOVAL WITH LAPAROSCOPIC GASTRIC SLEEVE RESECTION     REPLACEMENT TOTAL KNEE BILATERAL     TUBAL LIGATION      Current  Medications: Current Meds  Medication Sig   acetaminophen  (TYLENOL ) 500 MG tablet Take 2 tablets (1,000 mg total) by mouth every 6 (six) hours as needed for mild pain.   ALOE VERA PO Take 1 tablet by mouth daily. Natural aloe   anastrozole  (ARIMIDEX ) 1 MG tablet Take 1 tablet (1 mg total) by mouth daily.   ascorbic acid  (VITAMIN C ) 500 MG tablet Take 500 mg by mouth daily.   azelastine  (ASTELIN ) 0.1 % nasal spray Place 2 sprays into both nostrils 2 (two) times daily as needed for rhinitis or allergies.   B COMPLEX VITAMINS PO Take 1 tablet by mouth daily.   carvedilol  (COREG ) 12.5 MG tablet Take 1 tablet (12.5 mg total) by mouth 2 (two) times daily.   celecoxib  (CELEBREX ) 200 MG capsule Take 1 capsule (200 mg total) by mouth 2 (two) times daily as needed for moderate pain.   cetirizine  (ZYRTEC ) 10 MG tablet Take 1 tablet (10 mg total) by mouth 2 (two) times daily as needed for allergies (or hives).   colchicine  0.6 MG tablet Take 0.6 mg by mouth as needed.   desonide  (DESOWEN ) 0.05 % cream Apply topically to red and itchy areas up to twice a day if needed. Do not use this medication longer than 2 weeks in a row.   diphenhydrAMINE (BENADRYL) 25 mg capsule Take 25 mg by mouth every 6 (six) hours as needed.   EPINEPHrine  0.3 mg/0.3 mL IJ SOAJ injection Inject 0.3 mg into the muscle.   fluticasone  (FLONASE ) 50 MCG/ACT nasal spray Place 2 sprays into both nostrils daily.   furosemide (LASIX) 40 MG tablet Take 40 mg by mouth daily as needed.   gabapentin  (NEURONTIN ) 600 MG tablet Take 600 mg by mouth 3 (three) times daily.   GLOBAL EASE INJECT PEN NEEDLES 32G X 4 MM MISC in the morning and at bedtime.   hydrOXYzine  (ATARAX ) 25 MG tablet Take 25 mg by mouth 4 (four) times daily.   irbesartan  (AVAPRO ) 300 MG tablet Take 1 tablet (300 mg total) by mouth daily.   levocetirizine (XYZAL) 5 MG tablet Take 5 mg by mouth daily.   linaclotide  (LINZESS ) 290 MCG CAPS capsule Take 290 mcg by mouth daily before  breakfast.   montelukast  (SINGULAIR ) 10 MG tablet Take 1 tablet (10 mg total) by mouth at bedtime.   multivitamin-lutein (OCUVITE-LUTEIN) CAPS capsule Take 1 capsule by mouth as needed.   omeprazole (PRILOSEC) 40 MG capsule PRN   pimecrolimus  (ELIDEL ) 1 % cream Apply topically 2 (two) times daily.   rosuvastatin  (CRESTOR ) 20 MG tablet Take 1 tablet (20 mg total) by mouth daily.   tiZANidine  (ZANAFLEX ) 4 MG tablet Take 4 mg by mouth every 8 (eight) hours as needed for muscle spasms.   traZODone  (DESYREL ) 50 MG tablet Take 1 tablet (50 mg total) by mouth at bedtime.   VENTOLIN HFA 108 (90 Base) MCG/ACT inhaler Inhale 2 puffs into the lungs every 6 (six) hours as needed.   XIIDRA 5 % SOLN Apply 1 drop to eye 2 (two) times daily.     Allergies:   Penicillin g, Cephalexin, Etodolac, Iodinated contrast media,  Latex, Fish-derived products, Hydrochlorothiazide , Iodine, Penicillins, Shellfish allergy , and Lisinopril   Social History   Socioeconomic History   Marital status: Divorced    Spouse name: Not on file   Number of children: Not on file   Years of education: Not on file   Highest education level: Not on file  Occupational History   Occupation: Teacher    Comment: [employer]  Tobacco Use   Smoking status: Never    Passive exposure: Never   Smokeless tobacco: Never  Vaping Use   Vaping status: Never Used  Substance and Sexual Activity   Alcohol use: No   Drug use: Never   Sexual activity: Not Currently    Birth control/protection: None  Other Topics Concern   Not on file  Social History Narrative   Divorced.  Lives alone.  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   Moved from [facility] to [place] to help her son and daughter-in-law with 3 grandchildren.   2 cups coffee a day.  No alcohol.  Non-smoker.  No exercise.   Social Drivers of Corporate investment banker Strain: Low Risk   (05/01/2024)   Overall Financial Resource Strain (CARDIA)    Difficulty of Paying Living Expenses: Not hard at all  Food Insecurity: No Food Insecurity (05/01/2024)   Hunger Vital Sign    Worried About Running Out of Food in the Last Year: Never true    Ran Out of Food in the Last Year: Never true  Transportation Needs: No Transportation Needs (05/01/2024)   PRAPARE - Administrator, Civil Service (Medical): No    Lack of Transportation (Non-Medical): No  Physical Activity: Sufficiently Active (05/01/2024)   Exercise Vital Sign    Days of Exercise per Week: 4 days    Minutes of Exercise per Session: 150+ min  Stress: Stress Concern Present (05/01/2024)   [employer] of Occupational Health - Occupational Stress Questionnaire    Feeling of Stress : To some extent  Social Connections: Moderately Integrated (05/01/2024)   Social Connection and Isolation Panel    Frequency of Communication with Friends and Family: More than three times a week    Frequency of Social Gatherings with Friends and Family: More than three times a week    Attends Religious Services: More than 4 times per year    Active Member of [employer] or Organizations: Yes    Attends Engineer, structural: More than 4 times per year    Marital Status: Divorced     Family History: The patient's family history includes Hypertension in her father and mother. There is no history of Breast cancer.  ROS:   Please see the history of present illness.     All other systems reviewed and are negative.  EKGs/Labs/Other Studies Reviewed:    EKG:  EKG is not ordered today.   12/27/23: Sinus bradycardia.  Rate 52 bpm.    Recent Labs: 12/27/2023: Hemoglobin 13.4; Platelets 294 01/09/2024: Magnesium 1.8 03/20/2024: ALT 15; BUN 19; Creatinine, Ser 0.81; Potassium 4.3; Sodium 142   Recent Lipid Panel    Component Value Date/Time   CHOL 177 03/20/2024 1028   TRIG 98 03/20/2024 1028   HDL 47 03/20/2024 1028   CHOLHDL 3.8  03/20/2024 1028   LDLCALC 112 (H) 03/20/2024 1028    Physical Exam:    VS:  Pulse 68   Resp 16   Ht  5' 8 (1.727 m)   Wt 279 lb (126.6 kg)   SpO2 97%   BMI 42.42 kg/m  , BMI Body mass index is 42.42 kg/m. GENERAL:  Well appearing HEENT: Pupils equal round and reactive, fundi not visualized, oral mucosa unremarkable NECK:  No jugular venous distention, waveform within normal limits, carotid upstroke brisk and symmetric, no bruits, no thyromegaly LYMPHATICS:  No cervical adenopathy LUNGS:  Clear to auscultation bilaterally HEART:  RRR.  PMI not displaced or sustained,S1 and S2 within normal limits, no S3, no S4, no clicks, no rubs, no murmurs ABD:  Flat, positive bowel sounds normal in frequency in pitch, no bruits, no rebound, no guarding, no midline pulsatile mass, no hepatomegaly, no splenomegaly EXT:  2 plus pulses throughout, no edema, no cyanosis no clubbing SKIN:  No rashes no nodules NEURO:  Cranial nerves II through XII grossly intact, motor grossly intact throughout PSYCH:  Cognitively intact, oriented to person place and time   ASSESSMENT/PLAN:    Assessment & Plan # Resistant essential hypertension Hypertension remains uncontrolled with carvedilol  and irbesartan . Home readings often reach 200/110 mmHg. Pain and stress contribute to elevated levels. Recent kidney ultrasound showed mild right-sided blockage. - Order morning labs: renin, aldosterone, TSH, A1c. - Prescribe spironolactone  25mg  once daily in the morning.  BMP in 1 week.  - Provide blood pressure tracking book; track pressures twice daily. - Refer to pharmacist for prior authorization for weight loss medications. - Discuss stress and pain management for blood pressure control. - Discuss weight loss benefits on blood pressure.  # Obstructive sleep apnea Non-compliance with CPAP due to mask discomfort and nasal dryness. Sleep apnea may affect heart rate and blood pressure.  # Obesity Struggles with weight  management despite previous medications. Insurance issues with Ozempic. Engaged with nutritionist. Weight loss could lower blood pressure. - Check A1c for prediabetes or diabetes. - Refer to pharmacist for prior authorization for weight loss medications. - Encourage continued engagement with nutritionist and dietary modifications.  # Chronic pain due to osteoarthritis of the back Chronic back pain managed with epidurals and physical therapy. Pain contributes to elevated blood pressure and stress. Hesitant about surgery. - Continue epidural injections. - Consider ablation if pain persists.  # Hyperlipidemia Cholesterol levels elevated; started on rosuvastatin . Part of cardiovascular risk reduction. - Continue rosuvastatin  as prescribed.   Disposition:    F/u in 2 months   Medication Adjustments/Labs and Tests Ordered: Current medicines are reviewed at length with the patient today.  Concerns regarding medicines are outlined above.  No orders of the defined types were placed in this encounter.  No orders of the defined types were placed in this encounter.    Signed, Annabella Scarce, MD  08/18/2024 3:18 PM    [facility]

## 2024-08-18 NOTE — Patient Instructions (Addendum)
 Medication Instructions:  START SPIRONOLACTONE  25 MG DAILY   Labwork: RENIN/ALDOSTERONE/BMET/TSH/A1C IN ABOUT 1 WEEK   Testing/Procedures: NONE  Follow-Up: 2 MONTHS WITH DR Hercules OR CAITLIN W NP   You have been referred to [facility] TO DISCUSS GLP-1   Any Other Special Instructions Will Be Listed Below (If Applicable). MONITOR BLOOD PRESSURE TWICE A DAY AND LOG. BRING MACHINE AND LOG TO FOLLOW UP   If you need a refill on your cardiac medications before your next appointment, please call your pharmacy.

## 2024-08-25 ENCOUNTER — Ambulatory Visit: Admitting: Sleep Medicine

## 2024-08-26 ENCOUNTER — Ambulatory Visit (INDEPENDENT_AMBULATORY_CARE_PROVIDER_SITE_OTHER): Admitting: Sleep Medicine

## 2024-08-26 ENCOUNTER — Encounter: Payer: Self-pay | Admitting: Sleep Medicine

## 2024-08-26 VITALS — BP 126/80 | HR 59 | Temp 98.9°F | Ht 68.0 in | Wt 276.2 lb

## 2024-08-26 DIAGNOSIS — I10 Essential (primary) hypertension: Secondary | ICD-10-CM | POA: Diagnosis not present

## 2024-08-26 DIAGNOSIS — G4733 Obstructive sleep apnea (adult) (pediatric): Secondary | ICD-10-CM

## 2024-08-26 NOTE — Progress Notes (Signed)
 Name:Heather Mcintyre MRN: [id number] DOB: [birth year]   CHIEF COMPLAINT:  CPAP F/U   HISTORY OF PRESENT ILLNESS:  Heather Mcintyre is a 58 y.o. w/ a h/o OSA, HTN, hyperlipidemia, GERD and morbid obesity who presents for CPAP F/U visit. Reports difficulty using CPAP therapy due to severe dry mouth and would like to increase CPAP usage.   EPWORTH SLEEP SCORE    01/24/2024    9:32 AM  Results of the Epworth flowsheet  Sitting and reading 1  Watching TV 1  Sitting, inactive in a public place (e.g. a theatre or a meeting) 0  As a passenger in a car for an hour without a break 0  Lying down to rest in the afternoon when circumstances permit 0  Sitting and talking to someone 0  Sitting quietly after a lunch without alcohol 0  In a car, while stopped for a few minutes in traffic 0  Total score 2    PAST MEDICAL HISTORY :   has a past medical history of Allergy  (30 years ago), Anxiety, Asthma (40 years ago), Breast cancer (HCC), Cervical cancer (HCC) (20 years ago), Complication of anesthesia, Degenerative joint disease/osteoarthritis (30 years ago), Depression (30 years ago), GERD (gastroesophageal reflux disease) (10 years ago), Hypertension (30 years ago), Lupus, and Pre-diabetes.  has a past surgical history that includes Abdominal hysterectomy (12 years ago); Replacement total knee bilateral; Cholecystectomy (N/A, 20 years ago); Laparoscopic gastric band removal with laparoscopic gastric sleeve resection; Tubal ligation (36 years ago); Breast biopsy (Left, 10/04/2022); Joint replacement (15 years ago); Colonoscopy with esophagogastroduodenoscopy (egd); Breast lumpectomy,radio freq localizer,axillary sentinel lymph node biopsy (Left, 11/07/2022); and Incision and drainage abscess (Left, 04/11/2023). Prior to Admission medications   Medication Sig Start Date End Date Taking? Authorizing Provider  acetaminophen  (TYLENOL ) 500 MG tablet Take 2 tablets (1,000 mg total) by mouth every 6  (six) hours as needed for mild pain. 11/07/22  Yes Piscoya, Jose, MD  ALOE VERA PO Take 1 tablet by mouth daily. Natural aloe   Yes [provider]  anastrozole  (ARIMIDEX ) 1 MG tablet Take 1 tablet (1 mg total) by mouth daily. 02/13/24  Yes Jacobo Evalene PARAS, MD  ascorbic acid  (VITAMIN C ) 500 MG tablet Take 500 mg by mouth daily.   Yes [provider]  azelastine  (ASTELIN ) 0.1 % nasal spray Place 2 sprays into both nostrils 2 (two) times daily as needed for rhinitis or allergies. 02/19/24  Yes Tobie Arleta SQUIBB, MD  B COMPLEX VITAMINS PO Take 1 tablet by mouth daily.   Yes [provider]  carvedilol  (COREG ) 12.5 MG tablet Take 1 tablet (12.5 mg total) by mouth 2 (two) times daily. 01/22/24 05/27/24 Yes Wittenborn, Deborah, NP  celecoxib  (CELEBREX ) 200 MG capsule Take 1 capsule (200 mg total) by mouth 2 (two) times daily as needed for moderate pain. 11/07/22  Yes Piscoya, Aloysius, MD  cetirizine  (ZYRTEC ) 10 MG tablet Take 1 tablet (10 mg total) by mouth 2 (two) times daily as needed for allergies (or hives). 02/19/24  Yes Tobie Arleta SQUIBB, MD  colchicine  0.6 MG tablet Take 0.6 mg by mouth as needed. 04/29/24  Yes [provider]  diphenhydrAMINE (BENADRYL) 25 mg capsule Take 25 mg by mouth every 6 (six) hours as needed.   Yes [provider]  EPINEPHrine  0.3 mg/0.3 mL IJ SOAJ injection Inject 0.3 mg into the muscle. 01/29/24  Yes [provider]  fluticasone  (FLONASE ) 50 MCG/ACT nasal spray Place 2 sprays  into both nostrils daily. 02/19/24  Yes Tobie Arleta SQUIBB, MD  furosemide (LASIX) 40 MG tablet Take 40 mg by mouth daily as needed.   Yes [provider]  gabapentin  (NEURONTIN ) 600 MG tablet Take 600 mg by mouth 3 (three) times daily. 04/29/24  Yes [provider]  GLOBAL EASE INJECT PEN NEEDLES 32G X 4 MM MISC in the morning and at bedtime. 12/04/23  Yes [provider]  hydrOXYzine  (ATARAX ) 25 MG tablet Take 25 mg by mouth 4 (four) times  daily. 04/10/24  Yes [provider]  irbesartan  (AVAPRO ) 300 MG tablet Take 1 tablet (300 mg total) by mouth daily. 05/01/24  Yes Walker, Caitlin S, NP  levocetirizine (XYZAL) 5 MG tablet Take 5 mg by mouth daily. 04/24/24  Yes [provider]  linaclotide  (LINZESS ) 290 MCG CAPS capsule Take 290 mcg by mouth daily before breakfast.   Yes [provider]  montelukast  (SINGULAIR ) 10 MG tablet Take 1 tablet (10 mg total) by mouth at bedtime. 02/19/24  Yes Tobie Arleta SQUIBB, MD  multivitamin-lutein [facility]) CAPS capsule Take 1 capsule by mouth as needed.   Yes [provider]  rosuvastatin  (CRESTOR ) 20 MG tablet Take 1 tablet (20 mg total) by mouth daily. 04/04/24 07/03/24 Yes Anner Alm ORN, MD  tiZANidine  (ZANAFLEX ) 4 MG tablet Take 4 mg by mouth every 8 (eight) hours as needed for muscle spasms.   Yes [provider]  VENTOLIN HFA 108 (90 Base) MCG/ACT inhaler Inhale 2 puffs into the lungs every 6 (six) hours as needed. 01/29/24  Yes [provider]  BYETTA 5 MCG PEN 5 MCG/0.02ML SOPN injection Inject 5 mcg into the skin 2 (two) times daily with a meal. 12/06/23   [provider]  omeprazole (PRILOSEC) 40 MG capsule PRN Patient not taking: Reported on 05/27/2024 09/13/17   [provider]  metoprolol  succinate (TOPROL -XL) 50 MG 24 hr tablet Take 50 mg by mouth daily. Take with or immediately following a meal. Patient not taking: No sig reported    [provider]   Allergies  Allergen Reactions   Penicillin G Hives   Cephalexin Hives and Other (See Comments)   Etodolac Hives   Iodinated Contrast Media Hives   Latex Hives and Other (See Comments)   Fish-Derived Products Other (See Comments)   Hydrochlorothiazide  Itching   Iodine Hives   Penicillins    Shellfish Allergy  Hives   Lisinopril Other (See Comments) and Cough    FAMILY HISTORY:  family history includes Hypertension in her mother. SOCIAL HISTORY:  reports  that she has never smoked. She has never been exposed to tobacco smoke. She has never used smokeless tobacco. She reports that she does not drink alcohol and does not use drugs.   Review of Systems:  Gen:  Denies  fever, sweats, chills weight loss  HEENT: Denies blurred vision, double vision, ear pain, eye pain, hearing loss, nose bleeds, sore throat Cardiac:  No dizziness, chest pain or heaviness, chest tightness,edema, No JVD Resp:   No cough, -sputum production, -shortness of breath,-wheezing, -hemoptysis,  Gi: Denies swallowing difficulty, stomach pain, nausea or vomiting, diarrhea, constipation, bowel incontinence Gu:  Denies bladder incontinence, burning urine Ext:   Denies Joint pain, stiffness or swelling Skin: Denies  skin rash, easy bruising or bleeding or hives Endoc:  Denies polyuria, polydipsia , polyphagia or weight change Psych:   Denies depression, insomnia or hallucinations  Other:  All other systems negative  VITAL SIGNS: BP 126/80  Pulse (!) 59   Temp 98.9 F (37.2 C)   Ht 5' 8 (1.727 m)   Wt 276 lb 3.2 oz (125.3 kg)   SpO2 97%   Breastfeeding No   BMI 42.00 kg/m    Physical Examination:   General Appearance: No distress  EYES PERRLA, EOM intact.   NECK Supple, No JVD Pulmonary: normal breath sounds, No wheezing.  CardiovascularNormal S1,S2.  No m/r/g.   Abdomen: Benign, Soft, non-tender. Skin:   warm, no rashes, no ecchymosis  Extremities: normal, no cyanosis, clubbing. Neuro:without focal findings,  speech normal  PSYCHIATRIC: Mood, affect within normal limits.   ASSESSMENT AND PLAN  OSA Counseled patient on increasing CPAP compliance. For dry mouth, will increase humidity level and decrease max pressure to 8 cm H2O. Discussed the consequences of untreated sleep apnea. Advised not to drive drowsy for safety of patient and others. Will follow up in 3 months to review CPAP efficacy and compliance data.     HTN Stable, on current management.  Following with PCP.    Patient  satisfied with Plan of action and management. All questions answered  I spent a total of 23 minutes reviewing chart data, face-to-face evaluation with the patient, counseling and coordination of care as detailed above.    Kylea Berrong, M.D.  Sleep Medicine [facility]

## 2024-08-26 NOTE — Patient Instructions (Signed)

## 2024-08-29 ENCOUNTER — Ambulatory Visit
Admission: RE | Admit: 2024-08-29 | Discharge: 2024-08-29 | Disposition: A | Discharge status: Home / Self Care | Source: Ambulatory Visit | Attending: Pain Medicine | Admitting: Pain Medicine

## 2024-08-29 DIAGNOSIS — M4316 Spondylolisthesis, lumbar region: Secondary | ICD-10-CM | POA: Diagnosis present

## 2024-08-29 DIAGNOSIS — G8929 Other chronic pain: Secondary | ICD-10-CM | POA: Insufficient documentation

## 2024-08-29 DIAGNOSIS — R937 Abnormal findings on diagnostic imaging of other parts of musculoskeletal system: Secondary | ICD-10-CM | POA: Insufficient documentation

## 2024-08-29 DIAGNOSIS — M545 Low back pain, unspecified: Secondary | ICD-10-CM | POA: Insufficient documentation

## 2024-08-29 DIAGNOSIS — M5459 Other low back pain: Secondary | ICD-10-CM | POA: Insufficient documentation

## 2024-08-29 DIAGNOSIS — M47817 Spondylosis without myelopathy or radiculopathy, lumbosacral region: Secondary | ICD-10-CM | POA: Diagnosis present

## 2024-08-29 DIAGNOSIS — M47816 Spondylosis without myelopathy or radiculopathy, lumbar region: Secondary | ICD-10-CM | POA: Diagnosis present

## 2024-09-04 ENCOUNTER — Telehealth: Payer: Self-pay | Admitting: Surgery

## 2024-09-04 LAB — BASIC METABOLIC PANEL WITH GFR
BUN/Creatinine Ratio: 18 (ref 9–23)
BUN: 21 mg/dL (ref 6–24)
CO2: 25 mmol/L (ref 20–29)
Calcium: 9.7 mg/dL (ref 8.7–10.2)
Chloride: 100 mmol/L (ref 96–106)
Creatinine, Ser: 1.14 mg/dL — ABNORMAL HIGH (ref 0.57–1.00)
Glucose: 93 mg/dL (ref 70–99)
Potassium: 4.4 mmol/L (ref 3.5–5.2)
Sodium: 140 mmol/L (ref 134–144)
eGFR: 56 mL/min/1.73 — ABNORMAL LOW

## 2024-09-04 LAB — TSH: TSH: 2.56 u[IU]/mL (ref 0.450–4.500)

## 2024-09-04 LAB — ALDOSTERONE + RENIN ACTIVITY W/ RATIO
Aldos/Renin Ratio: 52.5 — ABNORMAL HIGH (ref 0.0–30.0)
Aldosterone: 10.7 ng/dL (ref 0.0–30.0)
Renin Activity, Plasma: 0.204 ng/mL/h (ref 0.167–5.380)

## 2024-09-04 LAB — HEMOGLOBIN A1C
Est. average glucose Bld gHb Est-mCnc: 131 mg/dL
Hgb A1c MFr Bld: 6.2 % — ABNORMAL HIGH (ref 4.8–5.6)

## 2024-09-04 NOTE — Telephone Encounter (Signed)
 Patient calls to get confirmation of dates regarding breast surgery.

## 2024-09-05 ENCOUNTER — Ambulatory Visit: Admitting: Sleep Medicine

## 2024-09-09 ENCOUNTER — Ambulatory Visit (HOSPITAL_BASED_OUTPATIENT_CLINIC_OR_DEPARTMENT_OTHER): Admitting: Pain Medicine

## 2024-09-09 ENCOUNTER — Encounter: Payer: Self-pay | Admitting: Pain Medicine

## 2024-09-09 ENCOUNTER — Ambulatory Visit
Admission: RE | Admit: 2024-09-09 | Discharge: 2024-09-09 | Disposition: A | Discharge status: Home / Self Care | Source: Ambulatory Visit | Attending: Pain Medicine | Admitting: Pain Medicine

## 2024-09-09 VITALS — BP 200/96 | HR 68 | Temp 97.1°F | Resp 19 | Ht 68.0 in | Wt 278.0 lb

## 2024-09-09 DIAGNOSIS — G8929 Other chronic pain: Secondary | ICD-10-CM | POA: Diagnosis present

## 2024-09-09 DIAGNOSIS — M5459 Other low back pain: Secondary | ICD-10-CM

## 2024-09-09 DIAGNOSIS — Z91041 Radiographic dye allergy status: Secondary | ICD-10-CM | POA: Insufficient documentation

## 2024-09-09 DIAGNOSIS — M47817 Spondylosis without myelopathy or radiculopathy, lumbosacral region: Secondary | ICD-10-CM | POA: Insufficient documentation

## 2024-09-09 DIAGNOSIS — M4316 Spondylolisthesis, lumbar region: Secondary | ICD-10-CM | POA: Diagnosis present

## 2024-09-09 DIAGNOSIS — Z9189 Other specified personal risk factors, not elsewhere classified: Secondary | ICD-10-CM | POA: Diagnosis present

## 2024-09-09 DIAGNOSIS — M47816 Spondylosis without myelopathy or radiculopathy, lumbar region: Secondary | ICD-10-CM

## 2024-09-09 DIAGNOSIS — M545 Low back pain, unspecified: Secondary | ICD-10-CM

## 2024-09-09 DIAGNOSIS — Z91040 Latex allergy status: Secondary | ICD-10-CM | POA: Diagnosis present

## 2024-09-09 DIAGNOSIS — Z6841 Body Mass Index (BMI) 40.0 and over, adult: Secondary | ICD-10-CM | POA: Insufficient documentation

## 2024-09-09 MED ORDER — TRIAMCINOLONE ACETONIDE 40 MG/ML IJ SUSP
INTRAMUSCULAR | Status: AC
Start: 2024-09-09 — End: 2024-09-09
  Filled 2024-09-09: qty 2

## 2024-09-09 MED ORDER — FENTANYL CITRATE (PF) 100 MCG/2ML IJ SOLN
INTRAMUSCULAR | Status: AC
  Filled 2024-09-09: qty 2

## 2024-09-09 MED ORDER — MIDAZOLAM HCL 5 MG/5ML IJ SOLN
INTRAMUSCULAR | Status: AC
  Filled 2024-09-09: qty 5

## 2024-09-09 MED ORDER — LIDOCAINE HCL 2 % IJ SOLN
INTRAMUSCULAR | Status: AC
  Filled 2024-09-09: qty 20

## 2024-09-09 MED ORDER — MIDAZOLAM HCL 5 MG/5ML IJ SOLN
0.5000 mg | Freq: Once | INTRAMUSCULAR | Status: AC
  Administered 2024-09-09: 2 mg via INTRAVENOUS

## 2024-09-09 MED ORDER — PENTAFLUOROPROP-TETRAFLUOROETH EX AERO: INHALATION_SPRAY | Freq: Once | CUTANEOUS | Status: DC

## 2024-09-09 MED ORDER — LIDOCAINE HCL 2 % IJ SOLN
20.0000 mL | Freq: Once | INTRAMUSCULAR | Status: AC
  Administered 2024-09-09: 400 mg

## 2024-09-09 MED ORDER — FENTANYL CITRATE (PF) 100 MCG/2ML IJ SOLN
25.0000 ug | INTRAMUSCULAR | Status: DC | PRN
  Administered 2024-09-09: 50 ug via INTRAVENOUS

## 2024-09-09 MED ORDER — TRIAMCINOLONE ACETONIDE 40 MG/ML IJ SUSP
80.0000 mg | Freq: Once | INTRAMUSCULAR | Status: AC
  Administered 2024-09-09: 80 mg

## 2024-09-09 MED ORDER — ROPIVACAINE HCL 2 MG/ML IJ SOLN
18.0000 mL | Freq: Once | INTRAMUSCULAR | Status: AC
  Administered 2024-09-09: 18 mL via PERINEURAL

## 2024-09-09 MED ORDER — ROPIVACAINE HCL 2 MG/ML IJ SOLN
INTRAMUSCULAR | Status: AC
  Filled 2024-09-09: qty 20

## 2024-09-09 NOTE — Patient Instructions (Signed)
 ______________________________________________________________________    Post-Procedure Discharge Instructions  INSTRUCTIONS Apply ice:  Purpose: This will minimize any swelling and discomfort after procedure.  When: Day of procedure, as soon as you get home. How: Fill a plastic sandwich bag with crushed ice. Cover it with a small towel and apply to injection site. How long: (15 min on, 15 min off) Apply for 15 minutes then remove x 15 minutes.  Repeat sequence on day of procedure, until you go to bed. Apply heat:  Purpose: To treat any soreness and discomfort from the procedure. When: Starting the next day after the procedure. How: Apply heat to procedure site starting the day following the procedure. How long: May continue to repeat daily, until discomfort goes away. Food intake: Start with clear liquids (like water) and advance to regular food, as tolerated.  Physical activities: Keep activities to a minimum for the first 8 hours after the procedure. After that, then as tolerated. Driving: If you have received any sedation, be responsible and do not drive. You are not allowed to drive for 24 hours after having sedation. Blood thinner: (Applies only to those taking blood thinners) You may restart your blood thinner 6 hours after your procedure. Insulin: (Applies only to Diabetic patients taking insulin) As soon as you can eat, you may resume your normal dosing schedule. Infection prevention: Keep procedure site clean and dry. Shower daily and clean area with soap and water.  PAIN DIARY Post-procedure Pain Diary: Extremely important that this be done correctly and accurately. Recorded information will be used to determine the next step in treatment. For the purpose of accuracy, follow these rules: Evaluate only the area treated. Do not report or include pain from an untreated area. For the purpose of this evaluation, ignore all other areas of pain, except for the treated area. After your  procedure, avoid taking a long nap and attempting to complete the pain diary after you wake up. Instead, set your alarm clock to go off every hour, on the hour, for the initial 8 hours after the procedure. Document the duration of the numbing medicine, and the relief you are getting from it. Do not go to sleep and attempt to complete it later. It will not be accurate. If you received sedation, it is likely that you were given a medication that may cause amnesia. Because of this, completing the diary at a later time may cause the information to be inaccurate. This information is needed to plan your care. Follow-up appointment: Keep your post-procedure follow-up evaluation appointment after the procedure (usually 2 weeks for most procedures, 6 weeks for radiofrequencies). DO NOT FORGET to bring you pain diary with you.   EXPECT... (What should I expect to see with my procedure?) From numbing medicine (AKA: Local Anesthetics): Numbness or decrease in pain. You may also experience some weakness, which if present, could last for the duration of the local anesthetic. Onset: Full effect within 15 minutes of injected. Duration: It will depend on the type of local anesthetic used. On the average, 1 to 8 hours.  From steroids (Applies only if steroids were used): Decrease in swelling or inflammation. Once inflammation is improved, relief of the pain will follow. Onset of benefits: Depends on the amount of swelling present. The more swelling, the longer it will take for the benefits to be seen. In some cases, up to 10 days. Duration: Steroids will stay in the system x 2 weeks. Duration of benefits will depend on multiple posibilities including persistent irritating  factors. Side-effects: If present, they may typically last 2 weeks (the duration of the steroids). Frequent: Cramps (if they occur, drink Gatorade and take over-the-counter Magnesium 450-500 mg once to twice a day); water retention with temporary weight  gain; increases in blood sugar; decreased immune system response; increased appetite. Occasional: Facial flushing (red, warm cheeks); mood swings; menstrual changes. Uncommon: Long-term decrease or suppression of natural hormones; bone thinning. (These are more common with higher doses or more frequent use. This is why we prefer that our patients avoid having any injection therapies in other practices.)  Very Rare: Severe mood changes; psychosis; aseptic necrosis. From procedure: Some discomfort is to be expected once the numbing medicine wears off. This should be minimal if ice and heat are applied as instructed.  CALL IF... (When should I call?) You experience numbness and weakness that gets worse with time, as opposed to wearing off. New onset bowel or bladder incontinence. (Applies only to procedures done in the spine)  Emergency Numbers: Durning business hours (Monday - Thursday, 8:00 AM - 4:00 PM) (Friday, 9:00 AM - 12:00 Noon): [phone] After hours: [phone] NOTE: If you are having a problem and are unable connect with, or to talk to a provider, then go to your nearest urgent care or emergency department. If the problem is serious and urgent, please call 911. ______________________________________________________________________     ______________________________________________________________________    Steroid injections  Common steroids for injections Triamcinolone : Used by many sports medicine physicians for large joint and bursal injections, often combined with a local anesthetic like lidocaine . A study focusing on coccydynia (tailbone pain) found triamcinolone  was more effective than betamethasone, suggesting it may also be preferable for other localized inflammation conditions. Methylprednisolone: A common alternative to triamcinolone  that is also a strong anti-inflammatory. It is available in different formulations, with the acetate suspension being the long-acting  option for intra-articular injections. Dexamethasone : This is a non-particulate steroid, meaning it has a lower risk of tissue damage compared to particulate steroids like triamcinolone  and methylprednisolone. While less common for this specific use, it is an option for targeted injections.   Considerations for physicians Particulate vs. non-particulate steroids: Triamcinolone  and methylprednisolone are particulate, meaning they can clump together. Dexamethasone  is non-particulate. Particulate steroids are often preferred for their longer-lasting effects but carry a theoretical higher risk for certain injections (though this is less of a concern in the costochondral joints). Combined injectate: Corticosteroids are typically mixed with a local anesthetic like lidocaine  to provide both immediate pain relief (from the anesthetic) and longer-term inflammation reduction (from the steroid). Imaging guidance: To ensure accurate placement of the needle and medication, physicians may use ultrasound or fluoroscopic guidance for the injection, especially in complex or refractory cases.   Patient guidance Before undergoing a steroid injection, discuss the options with your physician. They will determine the best steroid, dosage, and procedure for your specific case based on factors like: Severity of your condition History of response to other treatments Your overall health status Experience and preference of the physician  Last  Updated: 07/22/2024 ______________________________________________________________________

## 2024-09-09 NOTE — Progress Notes (Signed)
 Safety precautions to be maintained throughout the outpatient stay will include: orient to surroundings, keep bed in low position, maintain call bell within reach at all times, provide assistance with transfer out of bed and ambulation.

## 2024-09-09 NOTE — Progress Notes (Signed)
 PROVIDER NOTE: Interpretation of information contained herein should be left to medically-trained personnel. Specific patient instructions are provided elsewhere under Patient Instructions section of medical record. This document was created in part using STT-dictation technology, any transcriptional errors that may result from this process are unintentional.  Patient: Heather Mcintyre Type: Established DOB: 06-06-1966 MRN: [id number] PCP: Lorel Maxie LABOR, MD  Service: Procedure DOS: 09/09/2024 Setting: Ambulatory Location: Ambulatory outpatient facility Delivery: Face-to-face Provider: Eric LABOR Como, MD Specialty: Interventional Pain Management Specialty designation: 09 Location: Outpatient facility Ref. Prov.: Como Eric, MD       Interventional Therapy   Type: Lumbar Facet, Medial Branch Block(s)   #R1L2   Laterality: Bilateral  Level: L2, L3, L4, L5, and S1 Medial Branch/Dorsal Rami Level(s). Injecting these levels blocks the L3-4, L4-5, and L5-S1 lumbar facet joints. Imaging: Fluoroscopic guidance Spinal ([id number]) Anesthesia: Local anesthesia (1-2% Lidocaine ) Anxiolysis: IV Versed  2.0 mg Sedation: Minimal Sedation Fentanyl  1 mL (50 mcg) DOS: 09/09/2024 Performed by: Eric LABOR Como, MD  Primary Purpose: Diagnostic/Therapeutic Indications: Low back pain severe enough to impact quality of life or function. 1. Chronic low back pain (1ry area of Pain) (Bilateral) (L>R) w/o sciatica   2. Grade 1 Anterolisthesis (9 mm) of lumbar spine (L4/L5)   3. Arthropathy of lumbar facet joint   4. Low back pain of over 3 months duration   5. Lumbar facet joint pain   6. Lumbar facet hypertrophy (Multilevel) (Bilateral)   7. Lumbar facet joint syndrome   8. Lumbosacral facet arthropathy   9. Multifactorial low back pain   10. Osteoarthritis of facet joint of lumbar spine   11. Spondylosis without myelopathy or radiculopathy, lumbosacral region   12. Spondylolisthesis,  lumbar region   13. Morbid obesity with BMI of 40.0-44.9, adult (HCC)   14. At high risk for allergic reaction to latex   15. Latex precautions, history of latex allergy    16. History of allergy  to radiographic contrast media    NAS-11 Pain score:   Pre-procedure: 5 /10   Post-procedure: 0-No pain/10     Position / Prep / Materials:  Position: Prone  Prep solution: ChloraPrep (2% chlorhexidine  gluconate and 70% isopropyl alcohol) Area Prepped: Posterolateral Lumbosacral Spine (Wide prep: From the lower border of the scapula down to the end of the tailbone and from flank to flank.)  Materials:  Tray: Block Needle(s):  Type: Spinal  Gauge (G): 22  Length: 5-in Qty: 4     H&P (Pre-op Assessment):  Heather Mcintyre is a 58 y.o. (year old), female patient, seen today for interventional treatment. 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 Heather Mcintyre has a current medication list which includes the following prescription(s): acetaminophen , aloe vera, anastrozole , ascorbic acid , azelastine , b complex vitamins, carvedilol , celecoxib , cetirizine , colchicine , cyclobenzaprine, desonide , diphenhydramine, epinephrine , fluticasone , furosemide, gabapentin , global ease inject pen needles, hydroxyzine , irbesartan , levocetirizine, linaclotide , montelukast , multivitamin-lutein, omeprazole, pimecrolimus , rosuvastatin , spironolactone , tizanidine , trazodone , ventolin hfa, xiidra, and [DISCONTINUED] metoprolol  succinate, and the  following Facility-Administered Medications: fentanyl  and pentafluoroprop-tetrafluoroeth. Her primarily concern today is the Back Pain (Mid back)  Initial Vital Signs:  Pulse/HCG Rate: 68ECG Heart Rate: (!) 54 (sb) Temp: ROLLEN)  97.2 F (36.2 C) Resp: 18 BP: (!) 141/74 SpO2: 100 %  BMI: Estimated body mass index is 42.27 kg/m as calculated from the following:   Height as of this encounter: 5' 8 (1.727 m).   Weight as of this encounter: 278 lb (126.1 kg).  Risk Assessment: Allergies: Reviewed. She is allergic to penicillin g, cephalexin, etodolac, iodinated contrast media, latex, fish protein-containing drug products, hydrochlorothiazide , iodine, penicillins, shellfish allergy , and lisinopril.  Allergy  Precautions: None required Coagulopathies: Reviewed. None identified.  Blood-thinner therapy: None at this time Active Infection(s): Reviewed. None identified. Ms. Heather Mcintyre is afebrile  Site Confirmation: Ms. Lockner was asked to confirm the procedure and laterality before marking the site Procedure checklist: Completed Consent: Before the procedure and under the influence of no sedative(s), amnesic(s), or anxiolytics, the patient was informed of the treatment options, risks and possible complications. To fulfill our ethical and legal obligations, as recommended by the American Medical Association's Code of Ethics, I have informed the patient of my clinical impression; the nature and purpose of the treatment or procedure; the risks, benefits, and possible complications of the intervention; the alternatives, including doing nothing; the risk(s) and benefit(s) of the alternative treatment(s) or procedure(s); and the risk(s) and benefit(s) of doing nothing. The patient was provided information about the general risks and possible complications associated with the procedure. These may include, but are not limited to: failure to achieve desired goals, infection, bleeding, organ or nerve damage,  allergic reactions, paralysis, and death. In addition, the patient was informed of those risks and complications associated to Spine-related procedures, such as failure to decrease pain; infection (i.e.: Meningitis, epidural or intraspinal abscess); bleeding (i.e.: epidural hematoma, subarachnoid hemorrhage, or any other type of intraspinal or peri-dural bleeding); organ or nerve damage (i.e.: Any type of peripheral nerve, nerve root, or spinal cord injury) with subsequent damage to sensory, motor, and/or autonomic systems, resulting in permanent pain, numbness, and/or weakness of one or several areas of the body; allergic reactions; (i.e.: anaphylactic reaction); and/or death. Furthermore, the patient was informed of those risks and complications associated with the medications. These include, but are not limited to: allergic reactions (i.e.: anaphylactic or anaphylactoid reaction(s)); adrenal axis suppression; blood sugar elevation that in diabetics may result in ketoacidosis or comma; water  retention that in patients with history of congestive heart failure may result in shortness of breath, pulmonary edema, and decompensation with resultant heart failure; weight gain; swelling or edema; medication-induced neural toxicity; particulate matter embolism and blood vessel occlusion with resultant organ, and/or nervous system infarction; and/or aseptic necrosis of one or more joints. Finally, the patient was informed that Medicine is not an exact science; therefore, there is also the possibility of unforeseen or unpredictable risks and/or possible complications that may result in a catastrophic outcome. The patient indicated having understood very clearly. We have given the patient no guarantees and we have made no promises. Enough time was given to the patient to ask questions, all of which were answered to the patient's satisfaction. Ms. Pietila has indicated that she wanted to continue with the  procedure. Attestation: I, the ordering provider, attest that I have discussed with the patient the benefits, risks, side-effects, alternatives, likelihood of achieving goals, and potential problems during recovery for the procedure that I have provided informed consent. Date  Time: 09/09/2024  9:56 AM  Pre-Procedure Preparation:  Monitoring: As per clinic protocol. Respiration, ETCO2, SpO2, BP, heart rate and rhythm monitor placed and checked for adequate function Safety Precautions: Patient was assessed  for positional comfort and pressure points before starting the procedure. Time-out: I initiated and conducted the Time-out before starting the procedure, as per protocol. The patient was asked to participate by confirming the accuracy of the Time Out information. Verification of the correct person, site, and procedure were performed and confirmed by me, the nursing staff, and the patient. Time-out conducted as per Joint Commission's Universal Protocol (UP.01.01.01). Time: 1127 Start Time: 1127 hrs.  Description of Procedure:          Laterality: (see above) Targeted Levels: (see above)  Safety Precautions: Aspiration looking for blood return was conducted prior to all injections. At no point did we inject any substances, as a needle was being advanced. Before injecting, the patient was told to immediately notify me if she was experiencing any new onset of ringing in the ears, or metallic taste in the mouth. No attempts were made at seeking any paresthesias. Safe injection practices and needle disposal techniques used. Medications properly checked for expiration dates. SDV (single dose vial) medications used. After the completion of the procedure, all disposable equipment used was discarded in the proper designated medical waste containers. Local Anesthesia: Protocol guidelines were followed. The patient was positioned over the fluoroscopy table. The area was prepped in the usual manner. The  time-out was completed. The target area was identified using fluoroscopy. A 12-in long, straight, sterile hemostat was used with fluoroscopic guidance to locate the targets for each level blocked. Once located, the skin was marked with an approved surgical skin marker. Once all sites were marked, the skin (epidermis, dermis, and hypodermis), as well as deeper tissues (fat, connective tissue and muscle) were infiltrated with a small amount of a short-acting local anesthetic, loaded on a 10cc syringe with a 25G, 1.5-in  Needle. An appropriate amount of time was allowed for local anesthetics to take effect before proceeding to the next step. Local Anesthetic: Lidocaine  2.0% The unused portion of the local anesthetic was discarded in the proper designated containers. Technical description of process:  Medial Branch  Dorsal Rami Nerve Block (MBB):  Neuroanatomy note: Each lumbar facet joint receives dual innervation from medial branches arising from the posterior primary rami at the same level and one level above. The target for each lumbar medial branch is the junction of the ipsilateral superior articular and transverse process of the lower vertebral body. (i.e.: The L4-L5 facet joint is innervated by the L4 medial branch [located at L5] and the L3 medial branch [located at L4]. Blocking the L4 Medial Branch is therefore achieved by injecting at the junction of the ipsilateral superior articular and transverse process of the lower vertebral body [L5].).  Exception: The exception to the above rule is the L5-S1 facet joint which has triple innervation requiring the L4 medial branch, as well as the L5 and the S1 Dorsal Rami(s) to be blocked to fully denervate the joint.  Under fluoroscopic guidance, a needle was inserted until contact was made with os over the target area. After negative aspiration, 0.5 mL of the nerve block solution was injected without difficulty or complication. Paresthesia were avoided during  injection. The needle(s) were removed intact and without complication.  Once the entire procedure was completed, the treated area was cleaned, making sure to leave some of the prepping solution back to take advantage of its long term bactericidal properties.         Illustration of the posterior view of the lumbar spine and the posterior neural structures. Laminae of L2 through  S1 are labeled. DPRL5, dorsal primary ramus of L5; DPRS1, dorsal primary ramus of S1; DPR3, dorsal primary ramus of L3; FJ, facet (zygapophyseal) joint L3-L4; I, inferior articular process of L4; LB1, lateral branch of dorsal primary ramus of L1; IAB, inferior articular branches from L3 medial branch (supplies L4-L5 facet joint); IBP, intermediate branch plexus; MB3, medial branch of dorsal primary ramus of L3; NR3, third lumbar nerve root; S, superior articular process of L5; SAB, superior articular branches from L4 (supplies L4-5 facet joint also); TP3, transverse process of L3.   Facet Joint Innervation (* possible contribution)  L1-2 T12, L1 (L2*)  Medial Branch  L2-3 L1, L2 (L3*)                     L3-4 L2, L3 (L4*)                     L4-5 L3, L4 (L5*)                     L5-S1 L4, L5, S1                        Vitals:   09/09/24 1135 09/09/24 1147 09/09/24 1157 09/09/24 1206  BP: (!) 165/96 (!) 206/95 (!) 201/91 (!) 200/96  Pulse:      Resp: 15 12 12 19   Temp:  (!) 97.1 F (36.2 C)    TempSrc:  Temporal    SpO2: 100% 96% 98% 100%  Weight:      Height:         End Time: 1135 hrs.  Imaging Guidance (Spinal):         Type of Imaging Technique: Fluoroscopy Guidance (Spinal) Indication(s): Fluoroscopy guidance for needle placement to enhance accuracy in procedures requiring precise needle localization for targeted delivery of medication in or near specific anatomical locations not easily accessible without such real-time imaging assistance. Exposure Time: Please see nurses notes. Contrast: None  used. Fluoroscopic Guidance: I was personally present during the use of fluoroscopy. Tunnel Vision Technique used to obtain the best possible view of the target area. Parallax error corrected before commencing the procedure. Direction-depth-direction technique used to introduce the needle under continuous pulsed fluoroscopy. Once target was reached, antero-posterior, oblique, and lateral fluoroscopic projection used confirm needle placement in all planes. Images permanently stored in EMR. Interpretation: No contrast injected. I personally interpreted the imaging intraoperatively. Adequate needle placement confirmed in multiple planes. Permanent images saved into the patient's record.  Post-operative Assessment:  Post-procedure Vital Signs:  Pulse/HCG Rate: 6860 Temp: (!) 97.1 F (36.2 C) Resp: 19 BP: (!) 200/96 (work note given to return to work on 09/10/24 signed by FN) SpO2: 100 %  EBL: None  Complications: No immediate post-treatment complications observed by team, or reported by patient.  Note: The patient tolerated the entire procedure well. A repeat set of vitals were taken after the procedure and the patient was kept under observation following institutional policy, for this type of procedure. Post-procedural neurological assessment was performed, showing return to baseline, prior to discharge. The patient was provided with post-procedure discharge instructions, including a section on how to identify potential problems. Should any problems arise concerning this procedure, the patient was given instructions to immediately contact us , at any time, without hesitation. In any case, we plan to contact the patient by telephone for a follow-up status report regarding this interventional procedure.  Comments:  No additional relevant information.  Plan of Care (POC)  Orders:  Orders Placed This Encounter  Procedures   LUMBAR FACET(MEDIAL BRANCH NERVE BLOCK) MBNB    Scheduling  Instructions:     Procedure: Lumbar facet block (AKA.: Lumbosacral medial branch nerve block)     Side: Bilateral     Level: L3-4, L4-5, and L5-S1 Facets (L2, L3, L4, L5, and S1 Medial Branch Nerves)     Sedation: Patient's choice.     Date: 09/09/2024    Where will this procedure be performed?:   [facility]   [facility] C-ARM 1-60 MIN NO REPORT    Intraoperative interpretation by procedural physician at [facility].    Standing Status:   Standing    Number of Occurrences:   1    Reason for exam::   Assistance in needle guidance and placement for procedures requiring needle placement in or near specific anatomical locations not easily accessible without such assistance.   Informed Consent Details: Physician/Practitioner Attestation; Transcribe to consent form and obtain patient signature    Nursing Order: Transcribe to consent form and obtain patient signature. Note: Always confirm laterality of pain with Ms. Ruehl, before procedure.    Physician/Practitioner attestation of informed consent for procedure/surgical case:   I, the physician/practitioner, attest that I have discussed with the patient the benefits, risks, side effects, alternatives, likelihood of achieving goals and potential problems during recovery for the procedure that I have provided informed consent.    Procedure:   Lumbar Facet Block  under fluoroscopic guidance    Physician/Practitioner performing the procedure:   Phil Michels A. Tanya MD    Indication/Reason:   Low Back Pain, with our without leg pain, due to Facet Joint Arthralgia (Joint Pain) Spondylosis (Arthritis of the Spine), without myelopathy or radiculopathy (Nerve Damage).   Provide equipment / supplies at bedside    Procedure tray: Block Tray (Disposable  single use) Skin infiltration needle: Regular 1.5-in, 25-G, (x1) Block Needle type: Spinal Amount/quantity: 4 Size: Medium (5-inch) Gauge: 22G    Standing Status:   Standing     Number of Occurrences:   1    Specify:   Block Tray   Saline lock IV    Have LR [id number] mL available and administer at 125 mL/hr if patient becomes hypotensive.    Standing Status:   Standing    Number of Occurrences:   1   Miscellanous precautions    Standing Status:   Standing    Number of Occurrences:   1   Latex precautions    Activate Latex-Free Protocol.    Standing Status:   Standing    Number of Occurrences:   1     Opioid Analgesic:  No chronic opioid analgesics therapy prescribed by our practice. None. MME/day: 0 mg/day    Medications ordered for procedure: Meds ordered this encounter  Medications   lidocaine  (XYLOCAINE ) 2 % (with pres) injection 400 mg   pentafluoroprop-tetrafluoroeth (GEBAUERS) aerosol   midazolam  (VERSED ) 5 MG/5ML injection 0.5-2 mg    Make sure Flumazenil is available in the pyxis when using this medication. If oversedation occurs, administer 0.2 mg IV over 15 sec. If after 45 sec no response, administer 0.2 mg again over 1 min; may repeat at 1 min intervals; not to exceed 4 doses (1 mg)   fentaNYL  (SUBLIMAZE ) injection 25-50 mcg    Make sure Narcan is available in the pyxis when using this medication. In the event of respiratory depression (RR< 8/min): Titrate NARCAN (naloxone)  in increments of 0.1 to 0.2 mg IV at 2-3 minute intervals, until desired degree of reversal.   ropivacaine  (PF) 2 mg/mL (0.2%) (NAROPIN ) injection 18 mL   triamcinolone  acetonide (KENALOG -40) injection 80 mg   Medications administered: We administered lidocaine , midazolam , fentaNYL , ropivacaine  (PF) 2 mg/mL (0.2%), and triamcinolone  acetonide.  See the medical record for exact dosing, route, and time of administration.    Interventional Therapies  Risk Factors  Considerations  Medical Comorbidities:  ALLERGY : Contrast, Latex, Iodine, NSAIDS, PCN  MO (BMI>40)  GERD  HTN  Hx. Bradycardia     Planned  Pending:  (CONTRAST ALLERGY )  Diagnostic bilateral lumbar facet  MBB #R1L2   Diagnostic lumbar MRI (last done on 03/14/2019) Diagnostic x-rays of the cervical spine with flexion and extension (symptoms have worsened since last imaging of cervical spine on 12/17/2023)  Pending CT of head ordered by her PCP.    Under consideration:   Diagnostic bilateral lumbar facet MBB #1  Diagnostic/therapeutic right L2-3 LESI #1    Completed:   Diagnostic/therapeutic left L2-3 LESI x1 (07/29/2024) (100/80/50/80) (lower extremity symptoms appear to be gone) Diagnostic left lumbar facet (L3-4, L4-5, and L5-S1) MBB x1 (06/24/2024) (50/50/75/75)    Therapeutic  Palliative (PRN) options:   None established   Completed by other providers:   None reported      Follow-up plan:   Return in about 2 weeks (around 09/23/2024) for (Face2F), (PPE).     Recent Visits Date Type Provider Dept  08/13/24 Office Visit Tanya Glisson, MD [facility]  07/29/24 Procedure visit Tanya Glisson, MD [facility]  07/07/24 Office Visit Tanya Glisson, MD [facility]  06/24/24 Procedure visit Tanya Glisson, MD [facility]  Showing recent visits within past 90 days and meeting all other requirements Today's Visits Date Type Provider Dept  09/09/24 Procedure visit Tanya Glisson, MD [facility]  Showing today's visits and meeting all other requirements Future Appointments Date Type Provider Dept  09/22/24 Appointment Tanya Glisson, MD [facility]  Showing future appointments within next 90 days and meeting all other requirements   Disposition: Discharge home  Discharge (Date  Time): 09/09/2024; 1209 hrs.   Primary Care Physician: Lorel Maxie LABOR, MD Location: [facility] Note by: Glisson LABOR Tanya, MD (TTS technology used. I apologize for any typographical errors that were not detected and corrected.) Date: 09/09/2024; Time: 12:45 PM  Disclaimer:  Medicine is  not an Visual merchandiser. The only guarantee in medicine is that nothing is guaranteed. It is important to note that the decision to proceed with this intervention was based on the information collected from the patient. The Data and conclusions were drawn from the patient's questionnaire, the interview, and the physical examination. Because the information was provided in large part by the patient, it cannot be guaranteed that it has not been purposely or unconsciously manipulated. Every effort has been made to obtain as much relevant data as possible for this evaluation. It is important to note that the conclusions that lead to this procedure are derived in large part from the available data. Always take into account that the treatment will also be dependent on availability of resources and existing treatment guidelines, considered by other Pain Management Practitioners as being common knowledge and practice, at the time of the intervention. For Medico-Legal purposes, it is also important to point out that variation in procedural techniques and pharmacological choices are the acceptable norm. The indications, contraindications, technique, and results  of the above procedure should only be interpreted and judged by a Board-Certified Interventional Pain Specialist with extensive familiarity and expertise in the same exact procedure and technique.
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
## 2024-09-10 ENCOUNTER — Telehealth: Payer: Self-pay

## 2024-09-12 ENCOUNTER — Ambulatory Visit: Attending: Internal Medicine | Admitting: Pharmacist Clinician (PhC)/ Clinical Pharmacy Specialist

## 2024-09-12 ENCOUNTER — Encounter: Payer: Self-pay | Admitting: Pharmacist Clinician (PhC)/ Clinical Pharmacy Specialist

## 2024-09-12 VITALS — Ht 68.0 in | Wt 283.4 lb

## 2024-09-12 DIAGNOSIS — Z6841 Body Mass Index (BMI) 40.0 and over, adult: Secondary | ICD-10-CM

## 2024-09-12 DIAGNOSIS — Z713 Dietary counseling and surveillance: Secondary | ICD-10-CM | POA: Diagnosis not present

## 2024-09-12 DIAGNOSIS — E66813 Obesity, class 3: Secondary | ICD-10-CM | POA: Diagnosis not present

## 2024-09-12 NOTE — Patient Instructions (Signed)
 We will start the prior authorization process to get Zepbound covered by your insurance.   TIPS FOR SUCCESS Write down the reasons why you want to lose weight and post it in a place where you'll see it often. Start small and work your way up. Keep in mind that it takes time to achieve goals, and small steps add up. Any additional movements help to burn calories. Taking the stairs rather than the elevator and parking at the far end of your parking lot are easy ways to start. Brisk walking for at least 30 minutes 4 or more days of the week is an excellent goal to work toward  [employer] WHAT IT MEANS TO FEEL FULL Did you know that it can take 15 minutes or more for your brain to receive the message that you've eaten? That means that, if you eat less food, but consume it slower, you may still feel satisfied. Eating a lot of fruits and vegetables can also help you feel fuller. Eat off of smaller plates so that moderate portions don't seem too small  TITRATION PLAN Will plan to follow the titration plan as below, pending patient is tolerating each dose before increasing to the next. Can slow titration if needed for tolerability.    We will try to get Zepbound covered by your insurance.  We will start with 2.5 mg weekly for 4 weeks then increase by by 2.5 mg every 4 weeks as tolerated.    If you have any questions or concerns, please reach out to us .  Maricela Kawahara/Chris at [phone].  THANK YOU FOR CHOOSING [facility]

## 2024-09-12 NOTE — Progress Notes (Signed)
 Office Visit    Patient Name: Heather Mcintyre Date of Encounter: 09/12/2024  Primary Care Provider:  Lorel Maxie LABOR, MD Primary Cardiologist:  Alm Clay, MD  Chief Complaint    Weight management  Significant Past Medical History   OSA On CPAP, difficulties with mask, sleep study done earlier this year  preDM 10/25 Ac 6.2  HTN Uncontrolled, working with [facility]  HLD 108/25 LDL 112 on rosuvastatin  20  Chronic pain Managed with epidurals, PT    Allergies  Allergen Reactions   Penicillin G Hives   Cephalexin Hives and Other (See Comments)   Etodolac Hives   Iodinated Contrast Media Hives   Latex Hives and Other (See Comments)   Fish Protein-Containing Drug Products Other (See Comments)   Hydrochlorothiazide  Itching   Iodine Hives   Penicillins    Shellfish Allergy  Hives   Lisinopril Other (See Comments) and Cough    History of Present Illness    Heather Mcintyre is a 58 y.o. female patient of Dr Raford, in the office today to discuss options for weight management.    Current weight management medications:none   Previously tried meds: Zenical, victoza, Byetta, cabbage soup diet   Current meds that may affect weight: none  Baseline weight/BMI: 128.5 kg // 43.1  Insurance payor: [place] [employer] ( [employer])  Diet: does do daily juice (kale, spinach, apple, celery, lemon, lime); tends to graze more than eat meals, likes beef jerky, egg/spinach, Cheez-Its,   Exercise: walks some, but not consistently (back issues); just [facility] [place]    Confirmed patient not pregnant and no personal or family history of medullary thyroid carcinoma (MTC) or Multiple Endocrine Neoplasia syndrome type 2 (MEN 2).   Social History:   Tobacco: no  Alcohol: no  Caffeine: very little  Accessory Clinical Findings    Lab Results  Component Value Date   CREATININE 1.14 (H) 08/29/2024   BUN 21 08/29/2024   NA 140  08/29/2024   K 4.4 08/29/2024   CL 100 08/29/2024   CO2 25 08/29/2024   Lab Results  Component Value Date   ALT 15 03/20/2024   AST 21 03/20/2024   ALKPHOS 83 03/20/2024   BILITOT 0.4 03/20/2024   Lab Results  Component Value Date   HGBA1C 6.2 (H) 08/29/2024      Home Medications/Allergies    Current Outpatient Medications  Medication Sig Dispense Refill   acetaminophen  (TYLENOL ) 500 MG tablet Take 2 tablets (1,000 mg total) by mouth every 6 (six) hours as needed for mild pain.     ALOE VERA PO Take 1 tablet by mouth daily. Natural aloe     anastrozole  (ARIMIDEX ) 1 MG tablet Take 1 tablet (1 mg total) by mouth daily. 90 tablet 3   ascorbic acid  (VITAMIN C ) 500 MG tablet Take 500 mg by mouth daily.     azelastine  (ASTELIN ) 0.1 % nasal spray Place 2 sprays into both nostrils 2 (two) times daily as needed for rhinitis or allergies. 30 mL 5   B COMPLEX VITAMINS PO Take 1 tablet by mouth daily.     carvedilol  (COREG ) 12.5 MG tablet Take 1 tablet (12.5 mg total) by mouth 2 (two) times daily. 180 tablet 3   celecoxib  (CELEBREX ) 200 MG capsule Take 1 capsule (200 mg total) by mouth 2 (two) times daily as needed for moderate pain. 40 capsule 1   cetirizine  (ZYRTEC ) 10 MG tablet  Take 1 tablet (10 mg total) by mouth 2 (two) times daily as needed for allergies (or hives). 60 tablet 5   colchicine  0.6 MG tablet Take 0.6 mg by mouth as needed.     cyclobenzaprine (FLEXERIL) 5 MG tablet Take 5 mg by mouth 3 (three) times daily as needed.     desonide  (DESOWEN ) 0.05 % cream Apply topically to red and itchy areas up to twice a day if needed. Do not use this medication longer than 2 weeks in a row. 60 g 5   diphenhydrAMINE (BENADRYL) 25 mg capsule Take 25 mg by mouth every 6 (six) hours as needed.     EPINEPHrine  0.3 mg/0.3 mL IJ SOAJ injection Inject 0.3 mg into the muscle.     fluticasone  (FLONASE ) 50 MCG/ACT nasal spray Place 2 sprays into both nostrils daily. 16 g 5   furosemide (LASIX) 40 MG  tablet Take 40 mg by mouth daily as needed.     gabapentin  (NEURONTIN ) 600 MG tablet Take 600 mg by mouth 3 (three) times daily.     GLOBAL EASE INJECT PEN NEEDLES 32G X 4 MM MISC in the morning and at bedtime.     hydrOXYzine  (ATARAX ) 25 MG tablet Take 25 mg by mouth 4 (four) times daily.     irbesartan  (AVAPRO ) 300 MG tablet Take 1 tablet (300 mg total) by mouth daily. 90 tablet 1   levocetirizine (XYZAL) 5 MG tablet Take 5 mg by mouth daily.     linaclotide  (LINZESS ) 290 MCG CAPS capsule Take 290 mcg by mouth daily before breakfast.     montelukast  (SINGULAIR ) 10 MG tablet Take 1 tablet (10 mg total) by mouth at bedtime. 30 tablet 5   multivitamin-lutein (OCUVITE-LUTEIN) CAPS capsule Take 1 capsule by mouth as needed.     omeprazole (PRILOSEC) 40 MG capsule PRN     pimecrolimus  (ELIDEL ) 1 % cream Apply topically 2 (two) times daily. 30 g 0   rosuvastatin  (CRESTOR ) 20 MG tablet Take 1 tablet (20 mg total) by mouth daily. 90 tablet 3   spironolactone  (ALDACTONE ) 25 MG tablet Take 1 tablet (25 mg total) by mouth daily. 90 tablet 3   tiZANidine  (ZANAFLEX ) 4 MG tablet Take 4 mg by mouth every 8 (eight) hours as needed for muscle spasms.     traZODone  (DESYREL ) 50 MG tablet Take 1 tablet (50 mg total) by mouth at bedtime. 90 tablet 0   VENTOLIN HFA 108 (90 Base) MCG/ACT inhaler Inhale 2 puffs into the lungs every 6 (six) hours as needed.     XIIDRA 5 % SOLN Apply 1 drop to eye 2 (two) times daily.     No current facility-administered medications for this visit.     Allergies  Allergen Reactions   Penicillin G Hives   Cephalexin Hives and Other (See Comments)   Etodolac Hives   Iodinated Contrast Media Hives   Latex Hives and Other (See Comments)   Fish Protein-Containing Drug Products Other (See Comments)   Hydrochlorothiazide  Itching   Iodine Hives   Penicillins    Shellfish Allergy  Hives   Lisinopril Other (See Comments) and Cough    Assessment & Plan    Obesity  Patient has  not met goal of at least 5% of body weight loss with comprehensive lifestyle modifications alone in the past 3-6 months. Pharmacotherapy is appropriate to pursue as augmentation. Will start Zepbound.   Confirmed patient not pregnant and no personal or family history of medullary thyroid carcinoma (MTC) or  Multiple Endocrine Neoplasia syndrome type 2 (MEN 2).   Advised patient on common side effects including nausea, diarrhea, dyspepsia, decreased appetite, and fatigue. Counseled patient on reducing meal size and how to titrate medication to minimize side effects. Patient aware to call if intolerable side effects or if experiencing dehydration, abdominal pain, or dizziness. Patient will adhere to dietary modifications and will target at least 150 minutes of moderate intensity exercise weekly, plus resistance training twice a week (as recommended by the American Heart Association). This resistance training--such as weightlifting, bodyweight exercises, or using resistance bands, adapted to the patient's ability--will help prevent muscle loss.  Injection technique reviewed at today's visit.    Follow up in 1-2 days regarding coverage of Zepbound . If therapy is initiated, phone or MyChart follow-ups will be conducted every 4 weeks for dose titration until the patient reaches the effective therapeutic dose and target weight.    Seham Gardenhire PharmD CPP [facility]  [address] [place], [place] [address] [phone]

## 2024-09-12 NOTE — Assessment & Plan Note (Signed)
  Patient has not met goal of at least 5% of body weight loss with comprehensive lifestyle modifications alone in the past 3-6 months. Pharmacotherapy is appropriate to pursue as augmentation. Will start Zepbound.   Confirmed patient not pregnant and no personal or family history of medullary thyroid  carcinoma (MTC) or Multiple Endocrine Neoplasia syndrome type 2 (MEN 2).   Advised patient on common side effects including nausea, diarrhea, dyspepsia, decreased appetite, and fatigue. Counseled patient on reducing meal size and how to titrate medication to minimize side effects. Patient aware to call if intolerable side effects or if experiencing dehydration, abdominal pain, or dizziness. Patient will adhere to dietary modifications and will target at least 150 minutes of moderate intensity exercise weekly, plus resistance training twice a week (as recommended by the American Heart Association). This resistance training--such as weightlifting, bodyweight exercises, or using resistance bands, adapted to the patient's ability--will help prevent muscle loss.  Injection technique reviewed at today's visit.    Follow up in 1-2 days regarding coverage of Zepbound . If therapy is initiated, phone or MyChart follow-ups will be conducted every 4 weeks for dose titration until the patient reaches the effective therapeutic dose and target weight.

## 2024-09-17 ENCOUNTER — Ambulatory Visit
Admission: RE | Admit: 2024-09-17 | Discharge: 2024-09-17 | Disposition: A | Discharge status: Home / Self Care | Source: Ambulatory Visit | Attending: Surgery | Admitting: Surgery

## 2024-09-17 ENCOUNTER — Other Ambulatory Visit

## 2024-09-17 DIAGNOSIS — Z923 Personal history of irradiation: Secondary | ICD-10-CM | POA: Diagnosis not present

## 2024-09-17 DIAGNOSIS — Z853 Personal history of malignant neoplasm of breast: Secondary | ICD-10-CM | POA: Insufficient documentation

## 2024-09-17 DIAGNOSIS — C50912 Malignant neoplasm of unspecified site of left female breast: Secondary | ICD-10-CM | POA: Diagnosis present

## 2024-09-17 DIAGNOSIS — Z170 Estrogen receptor positive status [ER+]: Secondary | ICD-10-CM | POA: Diagnosis present

## 2024-09-17 DIAGNOSIS — Z08 Encounter for follow-up examination after completed treatment for malignant neoplasm: Secondary | ICD-10-CM | POA: Diagnosis not present

## 2024-09-17 HISTORY — DX: Personal history of irradiation: Z92.3

## 2024-09-18 NOTE — Progress Notes (Signed)
 PROVIDER NOTE: Interpretation of information contained herein should be left to medically-trained personnel. Specific patient instructions are provided elsewhere under Patient Instructions section of medical record. This document was created in part using AI and STT-dictation technology, any transcriptional errors that may result from this process are unintentional.  Patient: Heather Mcintyre  Service: E/M   PCP: Lorel Maxie LABOR, MD  DOB: October 01, 1966  DOS: 09/22/2024  Provider: Eric LABOR Como, MD  MRN: [id number]  Delivery: Face-to-face  Specialty: Interventional Pain Management  Type: Established Patient  Setting: Ambulatory outpatient facility  Specialty designation: 09  Referring Prov.: Lorel Maxie LABOR, MD  Location: Outpatient office facility       History of present illness (HPI) [name], a 58 y.o. year old female, is here today because of her Chronic bilateral low back pain without sciatica [M54.50, G89.29]. [name] primary complain today is Back Pain (lower)  Pertinent problems: 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 [facility]); Malignant neoplasm of left breast in female, estrogen receptor positive (HCC); Spondylolisthesis, lumbar region; Arthropathy of lumbar facet joint; Lumbar spondylosis; Chronic joint pain; History of knee replacement (Bilateral); Chronic low back pain (1ry area of Pain) (Bilateral) (L>R) w/o sciatica; Osteoarthritis; Osteoarthritis of ankle; Pes planovalgus, acquired (Left); Pes planovalgus, acquired (Right); Radiculopathy of lumbar region; Sacroiliac joint pain; Pain in joint of shoulder (Left); Chronic pain syndrome; Abnormal MRI, lumbar spine (03/14/2019); Lumbar facet hypertrophy (Multilevel) (Bilateral); Lumbar foraminal stenosis (Multilevel) (Bilateral); Lumbosacral foraminal stenosis (Severe) (L5-S1) (Bilateral); Grade 1 Anterolisthesis (9 mm) of lumbar spine (L4/L5); Osteoarthritis of facet joint of lumbar spine;  Osteoarthritis of lumbar spine; Lumbosacral facet arthropathy; Chronic lower extremity pain (2ry area of Pain) (Bilateral); Chronic hip pain (3ry area of Pain) (Bilateral); Chronic knee pain (4th area of Pain) (Bilateral); Chronic ankle pain (5th area of Pain) (Bilateral); Chronic neck pain (6th area of Pain) (Bilateral) (L>R); Lumbar facet joint pain; Lumbar facet joint syndrome; Spondylosis without myelopathy or radiculopathy, lumbosacral region; Chronic low back pain (Bilateral) w/ sciatica (Left); Chronic lower extremity pain (Left); Cervical facet hypertrophy; DDD (degenerative disc disease), cervical; Cervical facet joint pain (Left); Cervicalgia; Cervicogenic headache (Left); Occipital headache (greater occipital) (Left); Low back pain of over 3 months duration; Multifactorial low back pain; 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  Pain Assessment: Severity of Chronic pain is reported as a 7 /10. Location: Back Lower/denies. Onset: More than a month ago. Quality: Other (Comment) (nudge). Timing: Intermittent. Modifying factor(s): Gabapentin . Vitals:  height is 5' 8 (1.727 m) and weight is 280 lb (127 kg). Her temporal temperature is 96.9 F (36.1 C) (abnormal). Her blood pressure is 145/68 (abnormal) and her pulse is 66. Her respiration is 16 and oxygen saturation is 97%.  BMI: Estimated body mass index is 42.57 kg/m as calculated from the following:   Height as of this encounter: 5' 8 (1.727 m).   Weight as of this encounter: 280 lb (127 kg).  Last encounter: 08/13/2024. Last procedure: 09/09/2024.  Reason for encounter: post-procedure evaluation and assessment.   Discussed the use of AI scribe software for clinical note transcription with the patient, who gave verbal consent to proceed.  History of Present Illness   Heather Mcintyre is a 58 year old female with morbid obesity and low back pain who  presents for follow-up after a bilateral lumbar facet medial branch block.  She underwent a diagnostic bilateral lumbar  facet medial branch block on September 09, 2024, with 100% pain relief during the local anesthetic's duration and a sustained 70% improvement in low back pain. She experiences a recent exacerbation of throbbing back pain after sitting in a lower, harder chair at work, attributed to overexertion.  She weighs 280 pounds with a BMI of 42.57 kg/m and is actively seeking weight management assistance. She has a history of a gastric sleeve procedure.  She has sleep apnea and uses a CPAP machine with a nasal mask effectively.  She experiences allergies with nasal drip and nighttime coughing, managed with a nasal spray and antihistamine at night. No kidney problems or blood thinner use.      Post-Procedure Evaluation   Type: Lumbar Facet, Medial Branch Block(s)   #R1L2   Laterality: Bilateral  Level: L2, L3, L4, L5, and S1 Medial Branch/Dorsal Rami Level(s). Injecting these levels blocks the L3-4, L4-5, and L5-S1 lumbar facet joints. Imaging: Fluoroscopic guidance Spinal ([id number]) Anesthesia: Local anesthesia (1-2% Lidocaine ) Anxiolysis: IV Versed  2.0 mg Sedation: Minimal Sedation Fentanyl  1 mL (50 mcg) DOS: 09/09/2024 Performed by: Eric DELENA Como, MD  Primary Purpose: Diagnostic/Therapeutic Indications: Low back pain severe enough to impact quality of life or function. 1. Chronic low back pain (1ry area of Pain) (Bilateral) (L>R) w/o sciatica   2. Grade 1 Anterolisthesis (9 mm) of lumbar spine (L4/L5)   3. Arthropathy of lumbar facet joint   4. Low back pain of over 3 months duration   5. Lumbar facet joint pain   6. Lumbar facet hypertrophy (Multilevel) (Bilateral)   7. Lumbar facet joint syndrome   8. Lumbosacral facet arthropathy   9. Multifactorial low back pain   10. Osteoarthritis of facet joint of lumbar spine   11. Spondylosis without myelopathy or  radiculopathy, lumbosacral region   12. Spondylolisthesis, lumbar region   13. Morbid obesity with BMI of 40.0-44.9, adult (HCC)   14. At high risk for allergic reaction to latex   15. Latex precautions, history of latex allergy    16. History of allergy  to radiographic contrast media    NAS-11 Pain score:   Pre-procedure: 5 /10   Post-procedure: 0-No pain/10     Effectiveness:  Initial hour after procedure: 100 %. Subsequent 4-6 hours post-procedure: 100 %. Analgesia past initial 6 hours: 70 %. Ongoing improvement:  Analgesic: The patient indicates still enjoying an ongoing 70% relief of her low back pain. Function: Ms. Hunnicutt reports improvement in function ROM: Ms. Regino reports improvement in ROM  Pharmacotherapy Assessment   Opioid Analgesic:  No chronic opioid analgesics therapy prescribed by our practice. None. MME/day: 0 mg/day   Monitoring: [place] PMP: PDMP reviewed during this encounter.       Pharmacotherapy: No side-effects or adverse reactions reported. Compliance: No problems identified. Effectiveness: Clinically acceptable.  No notes on file  UDS:  Summary  Date Value Ref Range Status  12/17/2023 FINAL  Final    Comment:    ==================================================================== Compliance Drug Analysis, Ur ==================================================================== Test                             Result       Flag       Units  Drug Present and Declared for Prescription Verification   Gabapentin                      PRESENT      EXPECTED   Acetaminophen   PRESENT      EXPECTED   Diphenhydramine                PRESENT      EXPECTED   Hydroxyzine                     PRESENT      EXPECTED   Metoprolol                      PRESENT      EXPECTED  Drug Absent but Declared for Prescription Verification   Tizanidine                      Not Detected UNEXPECTED    Tizanidine , as indicated in the declared medication list, is  not    always detected even when used as directed.  ==================================================================== Test                      Result    Flag   Units      Ref Range   Creatinine              145              mg/dL      >=79 ==================================================================== Declared Medications:  The flagging and interpretation on this report are based on the  following declared medications.  Unexpected results may arise from  inaccuracies in the declared medications.   **Note: The testing scope of this panel includes these medications:   Diphenhydramine (Benadryl)  Gabapentin   Hydroxyzine  (Atarax )  Metoprolol  (Toprol )   **Note: The testing scope of this panel does not include small to  moderate amounts of these reported medications:   Acetaminophen   Tizanidine  (Zanaflex )   **Note: The testing scope of this panel does not include the  following reported medications:   Aloe Vera  Anastrozole  (Arimidex )  Celecoxib  (Celebrex )  Cetirizine  (Zyrtec )  Colchicine   Fluticasone  (Flonase )  Furosemide (Lasix)  Indomethacin  (Indocin )  Linaclotide  (Linzess )  Montelukast  (Singulair )  Multivitamin  Omeprazole (Prilosec)  Vitamin C  ==================================================================== For clinical consultation, please call [phone]. ====================================================================     No results found for: CBDTHCR No results found for: D8THCCBX No results found for: D9THCCBX  ROS  Constitutional: Denies any fever or chills Gastrointestinal: No reported hemesis, hematochezia, vomiting, or acute GI distress Musculoskeletal: Denies any acute onset joint swelling, redness, loss of ROM, or weakness Neurological: No reported episodes of acute onset apraxia, aphasia, dysarthria, agnosia, amnesia, paralysis, loss of coordination, or loss of consciousness  Medication Review  Aloe Vera, B Complex  Vitamins, EPINEPHrine , Insulin Pen Needle, Lifitegrast, acetaminophen , albuterol, anastrozole , ascorbic acid , azelastine , carvedilol , celecoxib , cetirizine , colchicine , cyclobenzaprine, desonide , diphenhydrAMINE, fluticasone , furosemide, gabapentin , hydrOXYzine , irbesartan , levocetirizine, linaclotide , metoprolol  succinate, montelukast , multivitamin-lutein, omeprazole, pimecrolimus , rosuvastatin , spironolactone , tiZANidine , and traZODone   History Review  Allergy : Ms. Eskenazi is allergic to cephalexin, etodolac, iodinated contrast media, latex, fish protein-containing drug products, hydrochlorothiazide , iodine, penicillins, shellfish allergy , and lisinopril. Drug: Ms. Cecere  reports no history of drug use. Alcohol:  reports no history of alcohol use. Tobacco:  reports that she has never smoked. She has never been exposed to tobacco smoke. She has never used smokeless tobacco. Social: Ms. Brownfield  reports that she has never smoked. She has never been exposed to tobacco smoke. She has never used smokeless tobacco. She reports that she does not drink alcohol and does not use drugs. Medical:  has a past medical history of  Allergy  (30 years ago), Anxiety, Asthma (40 years ago), Breast cancer (HCC), Cervical cancer (HCC) (20 years ago), Complication of anesthesia, Degenerative joint disease/osteoarthritis (30 years ago), Depression (30 years ago), GERD (gastroesophageal reflux disease) (10 years ago), Hypertension (30 years ago), Lupus, Personal history of radiation therapy, and Pre-diabetes. Surgical: Ms. Reimann  has a past surgical history that includes Abdominal hysterectomy (12 years ago); Replacement total knee bilateral; Cholecystectomy (N/A, 20 years ago); Laparoscopic gastric band removal with laparoscopic gastric sleeve resection; Tubal ligation (36 years ago); Breast biopsy (Left, 10/04/2022); Joint replacement (15 years ago); Colonoscopy with esophagogastroduodenoscopy (egd); Breast  lumpectomy,radio freq localizer,axillary sentinel lymph node biopsy (Left, 11/07/2022); and Incision and drainage abscess (Left, 04/11/2023). Family: family history includes Hypertension in her mother.  Laboratory Chemistry Profile   Renal Lab Results  Component Value Date   BUN 21 08/29/2024   CREATININE 1.14 (H) 08/29/2024   BCR 18 08/29/2024   GFRAA >60 02/10/2017   GFRNONAA >60 12/27/2023    Hepatic Lab Results  Component Value Date   AST 21 03/20/2024   ALT 15 03/20/2024   ALBUMIN  3.9 03/20/2024   ALKPHOS 83 03/20/2024    Electrolytes Lab Results  Component Value Date   NA 140 08/29/2024   K 4.4 08/29/2024   CL 100 08/29/2024   CALCIUM  9.7 08/29/2024   MG 1.8 01/09/2024    Bone Lab Results  Component Value Date   25OHVITD1 26 (L) 01/09/2024   25OHVITD2 <1.0 01/09/2024   25OHVITD3 26 01/09/2024    Inflammation (CRP: Acute Phase) (ESR: Chronic Phase) Lab Results  Component Value Date   CRP 2 01/09/2024   ESRSEDRATE 31 01/09/2024         Note: Above Lab results reviewed.  Recent Imaging Review  MM 3D DIAGNOSTIC MAMMOGRAM BILATERAL BREAST CLINICAL DATA:  Annual post lumpectomy surveillance. Patient has a history of a left lumpectomy for breast carcinoma performed in December 2023, treated with adjuvant radiation therapy.  EXAM: DIGITAL DIAGNOSTIC BILATERAL MAMMOGRAM WITH TOMOSYNTHESIS AND CAD  TECHNIQUE: Bilateral digital diagnostic mammography and breast tomosynthesis was performed. The images were evaluated with computer-aided detection.  COMPARISON:  Previous exam(s).  ACR Breast Density Category a: The breasts are almost entirely fatty.  FINDINGS: There is post lumpectomy scarring in the upper outer left breast, retracted when compared to the previous year's exam.  There are no breast masses, areas of significant asymmetry, areas of nonsurgical architectural distortion or suspicious calcifications.  IMPRESSION: 1. No evidence of new or  recurrent breast malignancy. 2. Benign post lumpectomy changes on the left.  RECOMMENDATION: Screening mammogram in one year.(Code:[id number]-B-01Y)  I have discussed the findings and recommendations with the patient. If applicable, a reminder letter will be sent to the patient regarding the next appointment.  BI-RADS CATEGORY  2: Benign.  Electronically Signed   By: Alm Parkins M.D.   On: 09/17/2024 10:15 Note: Reviewed        Physical Exam  Vitals: BP (!) 145/68 (Cuff Size: Large)   Pulse 66   Temp (!) 96.9 F (36.1 C) (Temporal)   Resp 16   Ht 5' 8 (1.727 m)   Wt 280 lb (127 kg)   SpO2 97%   BMI 42.57 kg/m  BMI: Estimated body mass index is 42.57 kg/m as calculated from the following:   Height as of this encounter: 5' 8 (1.727 m).   Weight as of this encounter: 280 lb (127 kg). Ideal: Ideal body weight: 63.9 kg (140 lb 14 oz) Adjusted ideal  body weight: 89.1 kg (196 lb 8.4 oz) General appearance: Well nourished, well developed, and well hydrated. In no apparent acute distress Mental status: Alert, oriented x 3 (person, place, & time)       Respiratory: No evidence of acute respiratory distress Eyes: PERLA   Assessment   Diagnosis Status  1. Chronic low back pain (1ry area of Pain) (Bilateral) (L>R) w/o sciatica   2. Lumbar facet joint pain   3. Low back pain of over 3 months duration   4. Lumbar facet joint syndrome   5. Grade 1 Anterolisthesis (9 mm) of lumbar spine (L4/L5)   6. Arthropathy of lumbar facet joint   7. Lumbar facet hypertrophy (Multilevel) (Bilateral)   8. Lumbosacral facet arthropathy   9. Osteoarthritis of facet joint of lumbar spine   10. Postop check    Controlled Controlled Controlled   Updated Problems: No problems updated.  Plan of Care  Problem-specific:  Assessment and Plan    Low back pain due to lumbar spondylosis and spondylolisthesis   Chronic low back pain has significantly improved following bilateral lumbar facet medial  branch block, with 100% relief during local anesthetic duration and ongoing 70% improvement. Recent exacerbation occurred due to increased activity and improper seating. Discuss pain prevention techniques using ibuprofen and magnesium. Advise taking 800 mg ibuprofen and 500 mg magnesium before anticipated physical activity. Recommend a warm shower and repeating ibuprofen and magnesium in the afternoon if needed. Consider radiofrequency ablation if BMI is reduced below 40.  Morbid obesity   Morbid obesity with a BMI of 42.57 kg/m impacts back pain and potential treatment options. She is consulting a specialist in [place] and considering weight loss interventions. She expressed concerns about surgical options involving intestinal resection due to potential long-term complications such as vitamin deficiencies. Encourage weight loss efforts and consultation with a specialist. Advise continuing to explore non-surgical weight loss options.  Obstructive sleep apnea   Obstructive sleep apnea is managed with CPAP therapy. She is compliant with CPAP use, which has improved symptoms. Ensure proper CPAP mask fit and usage. Advise contacting the provider if sleep apnea symptoms worsen.       Ms. AYDEE MCNEW has a current medication list which includes the following long-term medication(s): azelastine , carvedilol , cetirizine , diphenhydramine, fluticasone , furosemide, gabapentin , irbesartan , levocetirizine, linaclotide , montelukast , omeprazole, rosuvastatin , spironolactone , trazodone , and ventolin hfa.  Pharmacotherapy (Medications Ordered): No orders of the defined types were placed in this encounter.  Orders:  Orders Placed This Encounter  Procedures   Nursing Instructions:    Please complete this patient's postprocedure evaluation.    Scheduling Instructions:     Please complete this patient's postprocedure evaluation.     Interventional Therapies  Risk Factors  Considerations  Medical  Comorbidities:  ALLERGY : Contrast, Latex, Iodine, NSAIDS, PCN  MO (BMI>40)  GERD  HTN  Hx. Bradycardia     Planned  Pending:  (CONTRAST ALLERGY )     Under consideration:   Diagnostic bilateral lumbar facet MBB #R2L3 pending to bring weight below BMI of 42 then consider RFA. Diagnostic/therapeutic right L2-3 LESI #1    Completed:   Diagnostic/therapeutic left L2-3 LESI x1 (07/29/2024) (100/80/50/80) (lower extremity symptoms appear to be gone) Diagnostic left lumbar facet (L3-4, L4-5, and L5-S1) MBB x2 (09/09/2024) (100/100/70/70)  Diagnostic right lumbar facet MBB x1 (09/09/2024) (100/100/70/70)    Therapeutic  Palliative (PRN) options:   None established   Completed by other providers:   None reported     Return if symptoms worsen  or fail to improve.    Recent Visits Date Type Provider Dept  09/09/24 Procedure visit Tanya Glisson, MD [facility]  08/13/24 Office Visit Tanya Glisson, MD [facility]  07/29/24 Procedure visit Tanya Glisson, MD [facility]  07/07/24 Office Visit Tanya Glisson, MD [facility]  06/24/24 Procedure visit Tanya Glisson, MD [facility]  Showing recent visits within past 90 days and meeting all other requirements Today's Visits Date Type Provider Dept  09/22/24 Office Visit Tanya Glisson, MD [facility]  Showing today's visits and meeting all other requirements Future Appointments No visits were found meeting these conditions. Showing future appointments within next 90 days and meeting all other requirements  I discussed the assessment and treatment plan with the patient. The patient was provided an opportunity to ask questions and all were answered. The patient agreed with the plan and demonstrated an understanding of the instructions.  Patient advised to call back or seek an in-person evaluation if the symptoms or condition worsens.  Duration of  encounter: 30 minutes.  Total time on encounter, as per AMA guidelines included both the face-to-face and non-face-to-face time personally spent by the physician and/or other qualified health care professional(s) on the day of the encounter (includes time in activities that require the physician or other qualified health care professional and does not include time in activities normally performed by clinical staff).    Note by: Glisson DELENA Tanya, MD (TTS and AI technology used. I apologize for any typographical errors that were not detected and corrected.) Date: 09/22/2024; Time: 10:52 AM

## 2024-09-18 NOTE — Patient Instructions (Signed)
 ______________________________________________________________________    Patient information on: Body mass index (BMI) and Weight Management  Dear Heather Mcintyre you are receiving this information because your weight may be adversely affecting your health.   Your current Estimated body mass index is 43.09 kg/m as calculated from the following:   Height as of 09/12/24: 5' 8 (1.727 m).   Weight as of 09/12/24: 283 lb 6.4 oz (128.5 kg).  We recommend you talk to your primary care physician about providing or referring you to a supervised weight management program.  Here is some information about weight and the body mass index (BMI) classification:  BMI is a measure of obesity that's calculated by dividing a person's weight in kilograms by their height in meters squared. A person can use an online calculator to determine their BMI. Body mass index (BMI) is a common tool for deciding whether a person has an appropriate body weight.  It measures a person's weight in relation to their height.  According to the [facility] of health (NIH): A BMI of less than 18.5 means that a person is underweight. A BMI of between 18.5 and 24.9 is ideal. A BMI of between 25 and 29.9 is overweight. A BMI over 30 indicates obesity.  Body Mass Index (BMI) Classification BMI level (kg/m2) Category Associated incidence of chronic pain  <18  Underweight   18.5-24.9 Ideal body weight   25-29.9 Overweight  20%  30-34.9 Obese (Class I)  68%  35-39.9 Severe obesity (Class II)  136%  >40 Extreme obesity (Class III)  254%    Morbidly Obese Classification: You will be considered to be Morbidly Obese if your BMI is above 30 and you have one or more of the following conditions caused or associated to obesity: 1.    Type 2 Diabetes (Leading to cardiovascular diseases (CVD), stroke, peripheral vascular diseases (PVD), retinopathy, nephropathy, and neuropathy) 2.    Cardiovascular Disease (High Blood Pressure;  Congestive Heart Failure; High Cholesterol; Coronary Artery Disease; Angina; Arrhythmias, Dysrhythmias, or Heart Attacks) 3.    Breathing problems (Asthma; obesity-hypoventilation syndrome; obstructive sleep apnea; chronic inflammatory airway disease; reactive airway disease; or shortness of breath) 4.    Chronic kidney disease 5.    Liver disease (nonalcoholic fatty liver disease) 6.    High blood pressure 7.    Acid reflux (gastroesophageal reflux disease; heartburn) 8.    Osteoarthritis (OA) (affecting the hip(s), the knee(s) and/or the lower back) (usually requiring knee and/or hip replacements, as well as back surgeries) 9.    Low back pain (Lumbar Facet Syndrome; and/or Degenerative Disc Disease) 10.  Hip pain (Osteoarthritis of hip) (For every 1 lbs of added body weight, there is a 2 lbs increase in pressure inside of each hip articulation. 1:2 mechanical relationship) 11.  Knee pain (Osteoarthritis of knee) (For every 1 lbs of added body weight, there is a 4 lbs increase in pressure inside of each knee articulation. 1:4 mechanical relationship) (patients with a BMI>30 kg/m2 were 6.8 times more likely to develop knee OA than normal-weight individuals) 12.  Cancer: Epidemiological studies have shown that obesity is a risk factor for: post-menopausal breast cancer; cancers of the endometrium, colon and kidney cancer; malignant adenomas of the esophagus. Obese subjects have an approximately 1.5-3.5-fold increased risk of developing these cancers compared with normal-weight subjects, and it has been estimated that between 15 and 45% of these cancers can be attributed to overweight. More recent studies suggest that obesity may also increase the risk of other types  of cancer, including pancreatic, hepatic and gallbladder cancer. (Ref: Obesity and cancer. Pischon T, Nthlings U, [employer] H. Proc Nutr Soc. 2008 May;67(2):128-45. doi: 10.1017/S0029665108006976.) The International Agency for Research on Cancer  (IARC) has identified 13 cancers associated with overweight and obesity: meningioma, multiple myeloma, adenocarcinoma of the esophagus, and cancers of the thyroid, postmenopausal breast cancer, gallbladder, stomach, liver, pancreas, kidney, ovaries, uterus, colon and rectal (colorectal) cancers. 55 percent of all cancers diagnosed in women and 24 percent of those diagnosed in men are associated with overweight and obesity.  Recommendation: If you have any of the above conditions it is urgent that you take a step back and concentrate in losing weight. Dedicate 100% of your efforts on this task. Nothing else will improve your health more than bringing your weight down and your BMI to less than 30.   Nutritionist and/or supervised weight-management program: We are aware that most chronic pain patients are unable to exercise secondary to their pain. For this reason, you must rely on proper nutrition and diet in order to lose the weight. We recommend you talk to a nutritionist.   Bariatric surgery: A person might be considered a candidate for bariatric surgery if they meet one of the following BMI criteria:  BMI of 40 or higher: This is considered extreme obesity (Class III). BMI of 35-39.9: This is considered obesity, and the person might also have a serious weight-related health condition, such as high blood pressure, type 2 diabetes, or severe sleep apnea  BMI of 30-34.9: This might be considered if the person has serious weight-related health problems and hasn't had substantial weight loss or improvement in co-morbidities through other methods   On your own: A realistic goal is to lose 10% of your body weight over a period of 12 months.  If over a period of six (6) months you have unsuccessfully tried to lose weight, then it is time for you to seek professional help and to enter a medically supervised weight management program, and/or undergo bariatric surgery.   Pain management considerations and  possible limitations:  1.    Pharmacological Problems: Be advised that the use of opioid analgesics (oxycodone ; hydrocodone; morphine; methadone; codeine; and all of their derivatives) have been associated with decreased metabolism and weight gain.  For this reason, should we see that you are unable to lose weight while taking these medications, it may become necessary for us  to taper down and indefinitely discontinue them.  2.    Technical Problems: The incidence of successful interventional therapies decreases as the patient's BMI increases. It is much more difficult to accomplish a safe and effective interventional therapy on a patient with a BMI above 35. 3.    Radiation Exposure Problems: The x-rays machine, used to accomplish injection therapies, will automatically increase their x-ray output in order to capture an appropriate bone image. This means that radiation exposure increases exponentially with the patient's BMI. (The higher the BMI, the higher the radiation exposure.) Although the level of radiation used at a given time is still safe to the patient, it is not for the physician and/or assisting staff. Unfortunately, radiation exposure is accumulative. Because physicians and the staff have to do procedures and be exposed on a daily basis, this can result in health problems such as cancer and radiation burns. Radiation exposure to the staff is monitored by the radiation batches that they wear. The exposure levels are reported back to the staff on a quarterly basis. Depending on levels of exposure, physicians  and staff may be obligated by law to decrease this exposure. This means that they have the right and obligation to refuse providing therapies where they may be overexposed to radiation. For this reason, physicians may decline to offer therapies such as radiofrequency ablation or implants to patients with a BMI above 40. 4.    Current Trends: Be advised that the current trend is to no longer offer  certain therapies to patients with a BMI equal to, or above 35, due to increase perioperative risks, increased technical procedural difficulties, and excessive radiation exposure to healthcare personnel.  Last updated: 08/20/2023 ______________________________________________________________________

## 2024-09-22 ENCOUNTER — Encounter: Payer: Self-pay | Admitting: Pain Medicine

## 2024-09-22 ENCOUNTER — Telehealth: Payer: Self-pay | Admitting: Pharmacist Clinician (PhC)/ Clinical Pharmacy Specialist

## 2024-09-22 ENCOUNTER — Other Ambulatory Visit (HOSPITAL_COMMUNITY): Payer: Self-pay

## 2024-09-22 ENCOUNTER — Ambulatory Visit: Attending: Pain Medicine | Admitting: Pain Medicine

## 2024-09-22 ENCOUNTER — Telehealth: Payer: Self-pay | Admitting: Pharmacy Technician

## 2024-09-22 VITALS — BP 145/68 | HR 66 | Temp 96.9°F | Resp 16 | Ht 68.0 in | Wt 280.0 lb

## 2024-09-22 DIAGNOSIS — M47816 Spondylosis without myelopathy or radiculopathy, lumbar region: Secondary | ICD-10-CM | POA: Diagnosis not present

## 2024-09-22 DIAGNOSIS — G8929 Other chronic pain: Secondary | ICD-10-CM

## 2024-09-22 DIAGNOSIS — M5459 Other low back pain: Secondary | ICD-10-CM

## 2024-09-22 DIAGNOSIS — M545 Low back pain, unspecified: Secondary | ICD-10-CM | POA: Diagnosis not present

## 2024-09-22 DIAGNOSIS — Z09 Encounter for follow-up examination after completed treatment for conditions other than malignant neoplasm: Secondary | ICD-10-CM

## 2024-09-22 DIAGNOSIS — M4316 Spondylolisthesis, lumbar region: Secondary | ICD-10-CM

## 2024-09-22 DIAGNOSIS — M47817 Spondylosis without myelopathy or radiculopathy, lumbosacral region: Secondary | ICD-10-CM

## 2024-09-22 NOTE — Telephone Encounter (Signed)
    Pharmacy Patient Advocate Encounter   Received notification from Pt Calls Messages that prior authorization for zepbound is required/requested.   Insurance verification completed.   The patient is insured through [employer].   Per test claim: PA required; PA submitted to above mentioned insurance via Latent Key/confirmation #/[employer]OC [employer] Status is pending

## 2024-09-22 NOTE — Telephone Encounter (Signed)
 Please do PA for Zepbound, OSA/weight

## 2024-09-22 NOTE — Telephone Encounter (Signed)
 Pharmacy Patient Advocate Encounter  Received notification from [employer] that Prior Authorization for zepbound has been APPROVED from 11/28/23 to 11/26/25. Ran test claim, Copay is $0.00. This test claim was processed through [facility]- copay amounts may vary at other pharmacies due to pharmacy/plan contracts, or as the patient moves through the different stages of their insurance plan.   PA #/Case ID/Reference #: [id number]

## 2024-09-25 ENCOUNTER — Inpatient Hospital Stay: Admitting: Oncology

## 2024-09-26 ENCOUNTER — Telehealth: Payer: Self-pay | Admitting: Cardiology

## 2024-09-26 ENCOUNTER — Encounter: Payer: Self-pay | Admitting: Surgery

## 2024-09-26 ENCOUNTER — Ambulatory Visit: Admitting: Surgery

## 2024-09-26 VITALS — BP 135/80 | HR 77 | Ht 68.0 in | Wt 274.0 lb

## 2024-09-26 DIAGNOSIS — Z170 Estrogen receptor positive status [ER+]: Secondary | ICD-10-CM | POA: Diagnosis not present

## 2024-09-26 DIAGNOSIS — C50912 Malignant neoplasm of unspecified site of left female breast: Secondary | ICD-10-CM

## 2024-09-26 NOTE — Patient Instructions (Addendum)
 Patient will be asked to return to the office in one year with a bilateral screening mammogram. We will send you a letter about these appointments.   If you decide you would like the scar tissue removed from your breast in office call us  and we can schedule this for you.    Continue self breast exams. Call office for any new breast issues or concerns.   How to Do a Breast Self-Exam Doing breast self-exams can help you stay healthy. They're one way to know what's normal for your breasts. They can help you catch a problem while it's still small and can be treated. You need to: Check your breasts often. Tell your doctor about any changes. You should do breast self-exams even if you have breast implants. What you need: A mirror. A well-lit room. A pillow or other soft object. How to do a breast self-exam Look for changes  Take off all the clothes above your waist. Stand in front of a mirror in a room with good lighting. Put your hands down at your sides. Compare your breasts in the mirror. Look for difference between them, such as: Differences in shape. Differences in size. Wrinkles, dips, and bumps in one breast and not the other. Look at each breast for skin changes, such as: Redness. Scaly spots. Spots where your skin is thicker. Dimpling. Open sores. Look for changes in your nipples, such as: Fluid coming out of a nipple. Fluid around a nipple. Bleeding. Dimpling. Redness. A nipple that looks pushed in or that has changed position. Feel for changes Lie on your back. Feel each breast. To do this: Pick a breast to feel. Place a pillow under the shoulder closest to that breast. Put the arm closest to that breast behind your head. Feel the breast using the hand of your other arm. Use the pads of your three middle fingers to make small circles starting near the nipple. Use light, medium, and firm pressure. Keep making circles, moving down over the breast. Stop when you feel  your ribs. Start making circles with your fingers again, this time going up until you reach your collarbone. Then, make circles out across your breast and into your armpit area. Squeeze your nipple. Check for fluid and lumps. Do these steps again to check your other breast. Sit or stand in the tub or shower. With soapy water  on your skin, feel each breast the same way you did when you were lying down. Write down what you find Writing down what you find can help you keep track of what you want to tell your doctor. Write down: What's normal for each breast. Any changes you find. Write down: The kind of change. If your breast feels tender or painful. Any lump you find. Write down its size and where it is. When you last had your period. General tips If you're breastfeeding, the best time to check your breasts is after you feed your baby or after you use a breast pump. If you get a period, the best time to check your breasts is 5-7 days after your period ends. With time, you'll get more used to doing the self-exam. You'll also start to know if there are changes in your breasts. Contact a doctor if: You see a change in the shape or size of your breasts or nipples. You see a change in the skin of your breast or nipples. You have fluid coming from your nipples that isn't normal. You find a new lump  or thick area. You have breast pain. You have any concerns about your breast health. This information is not intended to replace advice given to you by your health care provider. Make sure you discuss any questions you have with your health care provider. Document Revised: 01/23/2024 Document Reviewed: 01/23/2024 Elsevier Patient Education  2025 [employer].

## 2024-09-26 NOTE — Progress Notes (Signed)
 09/26/2024  History of Present Illness: Heather Mcintyre is a 58 y.o. female s/p left breast lumpectomy and SLNBx on 11/07/22 for left breast cancer.  Patient presents today for follow up.  She reports that she's been doing well.  Denies any pain in either breast, although reports that she had soreness in the left breast after her most recent mammogram last week.  Denies any palpable masses, skin changes or nipple changes.  She had a mammogram on 09/17/24 which did not show any suspicious findings.  Past Medical History: Past Medical History:  Diagnosis Date   Allergy  30 years ago   Anxiety    Asthma 40 years ago   Breast cancer (HCC)    Cervical cancer (HCC) 20 years ago   Complication of anesthesia    woke up in the middle of surgery 2010   Degenerative joint disease/osteoarthritis 30 years ago   Depression 30 years ago   GERD (gastroesophageal reflux disease) 10 years ago   Hypertension 30 years ago   Lupus    Personal history of radiation therapy    Pre-diabetes      Past Surgical History: Past Surgical History:  Procedure Laterality Date   ABDOMINAL HYSTERECTOMY  12 years ago   BREAST BIOPSY Left 10/04/2022   US  LT BREAST BX W LOC DEV 1ST LESION IMG BX SPEC US  GUIDE 10/04/2022 [facility]   BREAST LUMPECTOMY,RADIO FREQ LOCALIZER,AXILLARY SENTINEL LYMPH NODE BIOPSY Left 11/07/2022   Procedure: BREAST LUMPECTOMY,RADIO FREQ LOCALIZER,AXILLARY SENTINEL LYMPH NODE BIOPSY;  Surgeon: Desiderio Schanz, MD;  Location: [facility];  Service: General;  Laterality: Left;   CHOLECYSTECTOMY N/A 20 years ago   COLONOSCOPY WITH ESOPHAGOGASTRODUODENOSCOPY (EGD)     INCISION AND DRAINAGE ABSCESS Left 04/11/2023   Procedure: INCISION AND DRAINAGE ABSCESS, breast;  Surgeon: Desiderio Schanz, MD;  Location: [facility] OR[facility];  Service: General;  Laterality: Left;   JOINT REPLACEMENT  15 years ago   LAPAROSCOPIC GASTRIC BAND REMOVAL WITH LAPAROSCOPIC GASTRIC SLEEVE RESECTION     REPLACEMENT TOTAL KNEE  BILATERAL     TUBAL LIGATION  36 years ago    Home Medications: Prior to Admission medications   Medication Sig Start Date End Date Taking? Authorizing Provider  acetaminophen  (TYLENOL ) 500 MG tablet Take 2 tablets (1,000 mg total) by mouth every 6 (six) hours as needed for mild pain. 11/07/22  Yes Edwena Mayorga, MD  ALOE VERA PO Take 1 tablet by mouth daily. Natural aloe   Yes [provider]  anastrozole  (ARIMIDEX ) 1 MG tablet Take 1 tablet (1 mg total) by mouth daily. 02/13/24  Yes Jacobo Evalene PARAS, MD  ascorbic acid  (VITAMIN C ) 500 MG tablet Take 500 mg by mouth daily.   Yes [provider]  azelastine  (ASTELIN ) 0.1 % nasal spray Place 2 sprays into both nostrils 2 (two) times daily as needed for rhinitis or allergies. 02/19/24  Yes Tobie Arleta SQUIBB, MD  B COMPLEX VITAMINS PO Take 1 tablet by mouth daily.   Yes [provider]  carvedilol  (COREG ) 12.5 MG tablet Take 1 tablet (12.5 mg total) by mouth 2 (two) times daily. 01/22/24 09/26/24 Yes Wittenborn, Barnie, NP  celecoxib  (CELEBREX ) 200 MG capsule Take 1 capsule (200 mg total) by mouth 2 (two) times daily as needed for moderate pain. 11/07/22  Yes Labron Bloodgood, Schanz, MD  cetirizine  (ZYRTEC ) 10 MG tablet Take 1 tablet (10 mg total) by mouth 2 (two) times daily as needed for allergies (or hives). 02/19/24  Yes Tobie Arleta SQUIBB, MD  colchicine   0.6 MG tablet Take 0.6 mg by mouth as needed. 04/29/24  Yes [provider]  cyclobenzaprine (FLEXERIL) 5 MG tablet Take 5 mg by mouth 3 (three) times daily as needed. 08/22/24  Yes [provider]  desonide  (DESOWEN ) 0.05 % cream Apply topically to red and itchy areas up to twice a day if needed. Do not use this medication longer than 2 weeks in a row. 06/05/24  Yes Ambs, Arlean HERO, FNP  diphenhydrAMINE (BENADRYL) 25 mg capsule Take 25 mg by mouth every 6 (six) hours as needed.   Yes [provider]  fluticasone  (FLONASE ) 50 MCG/ACT nasal spray Place 2 sprays into  both nostrils daily. 02/19/24  Yes Tobie Arleta SQUIBB, MD  furosemide (LASIX) 40 MG tablet Take 40 mg by mouth daily as needed.   Yes [provider]  gabapentin  (NEURONTIN ) 600 MG tablet Take 600 mg by mouth 3 (three) times daily. 04/29/24  Yes [provider]  hydrOXYzine  (ATARAX ) 25 MG tablet Take 25 mg by mouth 4 (four) times daily. 04/10/24  Yes [provider]  irbesartan  (AVAPRO ) 300 MG tablet Take 1 tablet (300 mg total) by mouth daily. 05/01/24  Yes Walker, Caitlin S, NP  levocetirizine (XYZAL) 5 MG tablet Take 5 mg by mouth daily. 04/24/24  Yes [provider]  linaclotide  (LINZESS ) 290 MCG CAPS capsule Take 290 mcg by mouth daily before breakfast.   Yes [provider]  montelukast  (SINGULAIR ) 10 MG tablet Take 1 tablet (10 mg total) by mouth at bedtime. 02/19/24  Yes Tobie Arleta SQUIBB, MD  omeprazole (PRILOSEC) 40 MG capsule PRN 09/13/17  Yes [provider]  pimecrolimus  (ELIDEL ) 1 % cream Apply topically 2 (two) times daily. 05/29/24  Yes Ambs, Arlean HERO, FNP  rosuvastatin  (CRESTOR ) 20 MG tablet Take 1 tablet (20 mg total) by mouth daily. 04/04/24 09/26/24 Yes Anner Alm ORN, MD  spironolactone  (ALDACTONE ) 25 MG tablet Take 1 tablet (25 mg total) by mouth daily. 08/18/24 11/16/24 Yes Raford Riggs, MD  tiZANidine  (ZANAFLEX ) 4 MG tablet Take 4 mg by mouth every 8 (eight) hours as needed for muscle spasms.   Yes [provider]  traZODone  (DESYREL ) 50 MG tablet Take 1 tablet (50 mg total) by mouth at bedtime. 05/27/24  Yes Reddy, Pallavi D, MD  VENTOLIN HFA 108 (90 Base) MCG/ACT inhaler Inhale 2 puffs into the lungs every 6 (six) hours as needed. 01/29/24  Yes [provider]  XIIDRA 5 % SOLN Apply 1 drop to eye 2 (two) times daily. 05/06/24  Yes [provider]    Allergies: Allergies  Allergen Reactions   Cephalexin Hives and Other (See Comments)   Etodolac Hives   Iodinated Contrast Media Hives   Latex Hives and Other  (See Comments)   Fish Protein-Containing Drug Products Other (See Comments)   Hydrochlorothiazide  Itching   Iodine Hives   Penicillins    Shellfish Allergy  Hives   Lisinopril Other (See Comments) and Cough    Review of Systems: Review of Systems  Constitutional:  Negative for chills and fever.  Respiratory:  Negative for shortness of breath.   Cardiovascular:  Negative for chest pain.  Gastrointestinal:  Negative for nausea and vomiting.  Skin:  Negative for rash.    Physical Exam BP 135/80   Pulse 77   Ht 5' 8 (1.727 m)   Wt 274 lb (124.3 kg)   SpO2 98%   BMI 41.66 kg/m  CONSTITUTIONAL: No acute distress HEENT:  Normocephalic, atraumatic, extraocular motion  intact. RESPIRATORY:  Normal respiratory effort without pathologic use of accessory muscles. CARDIOVASCULAR: Regular rhythm and rate. BREAST:  Left breast s/p lumpectomy in upper outer quadrant.  No palpable masses, skin changes, or nipple changes.  No left axillary lymphadenopathy.  Right breast without any palpable masses, skin changes, or nipple changes.  No right axillary lymphadenopathy. NEUROLOGIC:  Motor and sensation is grossly normal.  Cranial nerves are grossly intact. PSYCH:  Alert and oriented to person, place and time. Affect is normal.  Labs/Imaging: Mammogram on 09/17/24: FINDINGS: There is post lumpectomy scarring in the upper outer left breast, retracted when compared to the previous year's exam.   There are no breast masses, areas of significant asymmetry, areas of nonsurgical architectural distortion or suspicious calcifications.   IMPRESSION: 1. No evidence of new or recurrent breast malignancy. 2. Benign post lumpectomy changes on the left.   RECOMMENDATION: Screening mammogram in one year.(Code:SM-B-01Y)   I have discussed the findings and recommendations with the patient.  If applicable, a reminder letter will be sent to the patient regarding the next appointment.   BI-RADS CATEGORY  2:  Benign.  Assessment and Plan: This is a 58 y.o. female s/p left breast lumpectomy and SLNBx.  --Patient is doing well, now almost 2 years out from her surgery.  Denies any breast pain, skin changes, or any palpable masses.  Discussed that it's not uncommon to have soreness after mammogram due to the way the breast has to be positioned/pressed.  This resolved on its own and the patient currently is without symptoms. --She will follow up with me in 1 year with repeat mammogram. --Return precautions given.  I spent 20 minutes dedicated to the care of this patient on the date of this encounter to include pre-visit review of records, face-to-face time with the patient discussing diagnosis and management, and any post-visit coordination of care.   Aloysius Sheree Plant, MD [facility]

## 2024-09-26 NOTE — Telephone Encounter (Signed)
 Pt c/o medication issue:  1. Name of Medication:  carvedilol  (COREG ) 12.5 MG tablet  irbesartan  (AVAPRO ) 300 MG tablet  spironolactone  (ALDACTONE ) 25 MG tablet  2. How are you currently taking this medication (dosage and times per day)? Twice daily ( all above meds)  3. Are you having a reaction (difficulty breathing--STAT)? No  4. What is your medication issue? Pt states she has been taking above medications BID to try and keep BP under control. Pt would like to know if she is damaging herself by doing this. Please advise

## 2024-09-26 NOTE — Telephone Encounter (Signed)
 Pt calling to f/u on PA for Zepbound and requesting c/b from Pharmacist Kristin. Please advise

## 2024-09-27 NOTE — Telephone Encounter (Signed)
 Carvedilol ) to be twice a day, spironolactone  25 versus 50 WDL but I do not think that taking Avapro  twice a day makes a difference. If she is having that much trouble with blood pressure we probably need to get her in to be seen to potentially adjust medicines or add new medicines.

## 2024-09-28 MED ORDER — ZEPBOUND 2.5 MG/0.5ML ~~LOC~~ SOAJ: 2.5000 mg | SUBCUTANEOUS | 0 refills | Status: DC

## 2024-09-28 NOTE — Addendum Note (Signed)
 Addended by: Jerah Esty L on: 09/28/2024 02:01 PM   Modules accepted: Orders

## 2024-09-29 NOTE — Telephone Encounter (Signed)
 See other encounter

## 2024-10-03 ENCOUNTER — Ambulatory Visit: Payer: Self-pay | Admitting: Cardiovascular Disease

## 2024-10-06 ENCOUNTER — Other Ambulatory Visit: Payer: Self-pay

## 2024-10-07 ENCOUNTER — Other Ambulatory Visit: Payer: Self-pay

## 2024-10-07 MED ORDER — TRAZODONE HCL 50 MG PO TABS: 50.0000 mg | ORAL_TABLET | Freq: Every day | ORAL | 0 refills | Status: AC

## 2024-10-07 NOTE — Progress Notes (Signed)
 Received a fax form [facility] asking for a refill on Trazodone .  Per secure chat with Dr. Jess- okay to send in refill.   Refill has been sent in.   Nothing further needed

## 2024-10-08 MED ORDER — ROSUVASTATIN CALCIUM 20 MG PO TABS: 20.0000 mg | ORAL_TABLET | Freq: Every day | ORAL | 3 refills | Status: AC

## 2024-10-08 MED ORDER — SPIRONOLACTONE 25 MG PO TABS: 25.0000 mg | ORAL_TABLET | Freq: Every day | ORAL | 3 refills | Status: AC

## 2024-10-08 MED ORDER — CARVEDILOL 12.5 MG PO TABS: 12.5000 mg | ORAL_TABLET | Freq: Two times a day (BID) | ORAL | 3 refills | Status: AC

## 2024-10-09 ENCOUNTER — Ambulatory Visit: Admitting: Cardiology

## 2024-10-09 NOTE — Progress Notes (Deleted)
 Cardiology Office Note:  .   Date:  10/09/2024  ID:  CANDI PROFIT, DOB [birth year], MRN [id number] PCP: Lorel Maxie LABOR, MD  [facility] Providers Cardiologist:  Alm Clay, MD { Click to update primary MD,subspecialty MD or APP then REFRESH:1}    No chief complaint on file.   Patient Profile: .     Heather Mcintyre is a manage abuse 58 y.o. female with a PMH notable for Poorly Controlled Hypertension, DM-2, etc. (reviewed below) who presents here for 1 month follow-up for BP management. She was initially Referred at the request of Aycock, Ngwe A, MD.  PMH notable for  Poorly controlled HTN,   Did not take valsartan -HCT more than a couple days because of allergies .  Was initially thought to be valsartan  hand although she was tried on HCTZ alone and apparently also had allergy . She indicates that she thinks its food coloring and in medications that may be the cause for her allergy  DM-2,  OSA on CPAP (but notes that the nasal pillow irritates her skin. 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  Metoprolol  was converted to carvedilol  and the valsartan -HCTZ was stopped with HCTZ alone started.  Apparently she was unable to take HCTZ-ostensibly due to gout.  She was continued on carvedilol  12.5 mg twice daily and as needed Lasix 40 mg daily, Byetta 5 mcg twice daily.  My exam she was doing relatively well but had missed some doses of medications and noted that her blood pressures were getting as high as 190s over 100s.  Lots of stress attributed to her children.  No chest pain or pressure.  No headache or blurred vision.  Just notes that when her blood pressure goes high she feels poorly .  Her  blood pressure was 146/80. => I continue the carvedilol  and started irbesartan  150 mg daily along with the standing dose of Lasix 20 mg daily.  Referred her to the hypertension clinic with [facility] Pharmacist Team:  She initially seen by Heather Finder, NP on June 5.  => Irbesartan  was increased to 300 mg manage catecholamines/metanephrines and renin-aldosterone checked.  Suggested next option would be to consider spironolactone . She was then seen by Heather Scarce, MD on 08/18/2024.  Clearly they noted that she was not paying attention to results well.  Noted inability to tolerate Byetta. => Spironolactone  25 mg ordered.  Referred for evaluation for GLP-1 agonist.  She is also started on rosuvastatin .  She was seen on 09/12/2024 by Heather Mcintyre, [facility]- CCP with prior authorization for Zepbound placed.  Subjective  Discussed the use of AI scribe software for clinical note transcription with the patient, who gave verbal consent to proceed.  History of Present Illness   Cardiovascular ROS: {roscv:310661}  ROS:  Review of Systems - {ros master:310782}    Objective   Previous antihypertensives: Metoprolol  - changed to carvedilol  Valsartan  - trouble with dye Lisinopril - cough Hydrochlorothiazide  - itching  Amlodipine   - poor reaction Losartan - poor reaction  Previously failed weight loss medications: Xenical, Victoza, Byetta, cabbage soup diet  Diet: Daily juice.  Tends to graze more than eat meals.  Eats beef jerky.  Walks some but  not consistently.  Had recently [facility] at [facility].   Studies Reviewed: SABRA        Lab Results  Component Value Date   NA 140 08/29/2024   K 4.4 08/29/2024   CREATININE 1.14 (H) 08/29/2024   EGFR 56 (L) 08/29/2024   GLUCOSE 93 08/29/2024   Lab Results  Component Value Date   HGBA1C 6.2 (H) 08/29/2024   Lab Results  Component Value Date   CHOL 177 03/20/2024   HDL 47 03/20/2024   LDLCALC 112 (H) 03/20/2024   TRIG 98 03/20/2024    CHOLHDL 3.8 03/20/2024   Aldo/renin ratio 52.5 with an aldosterone 10.7 and renin activity of 0.2-suggest hyperaldosteronism Results  ECHO: *** CATH: *** MONITOR: *** CT: ***  Risk Assessment/Calculations:   {Does this patient have ATRIAL FIBRILLATION?[phone]} No BP recorded.  {Refresh Note OR Click here to enter BP  :1}***   STOP-Bang Score:  6  { Consider Dx Sleep Disordered Breathing or Sleep Apnea  ICD G47.33          :1}      Physical Exam:   VS:  There were no vitals taken for this visit.   Wt Readings from Last 3 Encounters:  09/26/24 274 lb (124.3 kg)  09/22/24 280 lb (127 kg)  09/12/24 283 lb 6.4 oz (128.5 kg)    Physical Exam    GEN: Well nourished, well developed in no acute distress; *** NECK: No JVD; No carotid bruits CARDIAC: Normal S1, S2; RRR, no murmurs, rubs, gallops RESPIRATORY:  Clear to auscultation without rales, wheezing or rhonchi ; nonlabored, good air movement. ABDOMEN: Soft, non-tender, non-distended EXTREMITIES:  No edema; No deformity      ASSESSMENT AND PLAN: .    Problem List Items Addressed This Visit   None   Assessment and Plan Assessment & Plan        {Are you ordering a CV Procedure (e.g. stress test, cath, DCCV, TEE, etc)?   Press F2        :[id number]}   Follow-Up: No follow-ups on file.  I spent *** minutes in the care of RONDELL FRICK today including {CHL AMB CAR Time Based Billing Options STW (Optional):[id number]::documenting in the encounter.}      Signed, Alm MICAEL Clay, MD, MS Alm Clay, M.D., M.S. Interventional Cardiologist  [facility] Pager # [phone]

## 2024-10-27 ENCOUNTER — Telehealth: Payer: Self-pay

## 2024-10-27 NOTE — Telephone Encounter (Signed)
-----   Message from Barnie Hila sent at 10/27/2024  9:04 AM EST ----- Abby,   Can we please cancel this patient's visit scheduled for Friday 12/5. She has a visit with Reche Finder, NP in the hypertension clinic on 12/11 so she will not need to see me.   Thank you!  DW

## 2024-10-27 NOTE — Telephone Encounter (Signed)
 Called patient per note from Barnie Hila, NP, to cancel 12/5 office visit in [place]

## 2024-10-29 ENCOUNTER — Telehealth: Payer: Self-pay | Admitting: Cardiology

## 2024-10-29 NOTE — Telephone Encounter (Signed)
*  STAT* If patient is at the pharmacy, call can be transferred to refill team.   1. Which medications need to be refilled? (please list name of each medication and dose if known)   tiZANidine  (ZANAFLEX ) 4 MG tablet    2. Which pharmacy/location (including street and city if local pharmacy) is medication to be sent to?  [id number] - [place], [place] - [address] [facility] [address]      3. Do they need a 30 day or 90 day supply? 90 day    Pt is out of medication

## 2024-10-30 ENCOUNTER — Other Ambulatory Visit: Payer: Self-pay | Admitting: Cardiology

## 2024-10-30 NOTE — Telephone Encounter (Signed)
 Placed call back to pt to verify the medication that she is requesting to be refilled.  Per pt, she is asking for her Zepbound .  Pt aware I will send this over to our Pharmacy team.  Pt did ask that someone form the Pharmacy team give her a call.

## 2024-10-31 ENCOUNTER — Ambulatory Visit: Admitting: Student

## 2024-10-31 NOTE — Telephone Encounter (Signed)
 I tried to call pt, no answer and VM is full.

## 2024-11-05 MED ORDER — ZEPBOUND 5 MG/0.5ML ~~LOC~~ SOAJ: 5.0000 mg | SUBCUTANEOUS | 0 refills | Status: DC

## 2024-11-06 ENCOUNTER — Encounter (HOSPITAL_BASED_OUTPATIENT_CLINIC_OR_DEPARTMENT_OTHER): Payer: Self-pay | Admitting: Family

## 2024-11-06 ENCOUNTER — Ambulatory Visit (INDEPENDENT_AMBULATORY_CARE_PROVIDER_SITE_OTHER): Admitting: Family

## 2024-11-06 VITALS — BP 122/78 | HR 50 | Ht 68.0 in | Wt 278.9 lb

## 2024-11-06 DIAGNOSIS — M79604 Pain in right leg: Secondary | ICD-10-CM | POA: Diagnosis not present

## 2024-11-06 DIAGNOSIS — G4733 Obstructive sleep apnea (adult) (pediatric): Secondary | ICD-10-CM | POA: Diagnosis not present

## 2024-11-06 DIAGNOSIS — I10 Essential (primary) hypertension: Secondary | ICD-10-CM | POA: Diagnosis not present

## 2024-11-06 LAB — BASIC METABOLIC PANEL WITH GFR
BUN/Creatinine Ratio: 24 — ABNORMAL HIGH (ref 9–23)
BUN: 22 mg/dL (ref 6–24)
CO2: 23 mmol/L (ref 20–29)
Calcium: 9.7 mg/dL (ref 8.7–10.2)
Chloride: 104 mmol/L (ref 96–106)
Creatinine, Ser: 0.91 mg/dL (ref 0.57–1.00)
Glucose: 97 mg/dL (ref 70–99)
Potassium: 4.2 mmol/L (ref 3.5–5.2)
Sodium: 142 mmol/L (ref 134–144)
eGFR: 73 mL/min/1.73 (ref >59)

## 2024-11-06 NOTE — Patient Instructions (Addendum)
 Medication Instructions:  Continue your current medications  Increase to Zepbound  5mg  weekly, the prescription was sent to he pharmacy yesterday   Labwork: Your physician recommends that you return for lab work today: BMET  Follow-Up: Please follow up in 3 months with Dr. Anner in [place] and in 6-9 months in [facility] with Dr. Raford, Reche Finder, NP or Allean Mink PharmD   Special Instructions:    Tips to Measure your Blood Pressure Correctly  Here's what you can do to ensure a correct reading:  Don't drink a caffeinated beverage or smoke during the 30 minutes before the test.  Sit quietly for five minutes before the test begins.  During the measurement, sit in a chair with your feet on the floor and your arm supported so your elbow is at about heart level.  The inflatable part of the cuff should completely cover at least 80% of your upper arm, and the cuff should be placed on bare skin, not over a shirt.  Don't talk during the measurement.  Have your blood pressure measured twice, with a brief break in between. If the readings are different by 5 points or more, have it done a third time.   Blood pressure categories  Blood pressure category SYSTOLIC (upper number)  DIASTOLIC (lower number)  Normal Less than 120 mm Hg and Less than 80 mm Hg  Elevated 120-129 mm Hg and Less than 80 mm Hg  High blood pressure: Stage 1 hypertension 130-139 mm Hg or 80-89 mm Hg  High blood pressure: Stage 2 hypertension 140 mm Hg or higher or 90 mm Hg or higher  Hypertensive crisis (consult your doctor immediately) Higher than 180 mm Hg and/or Higher than 120 mm Hg  Source: American Heart Association and American Stroke Association. For more on getting your blood pressure under control, buy Controlling Your Blood Pressure, a Special Health Report from [facility].   Blood Pressure Log   Date   Time  Blood Pressure  Position  Example: Nov 1 9 AM 124/78 sitting

## 2024-11-06 NOTE — Progress Notes (Signed)
 [facility] Assessment:    Date:  11/06/2024   ID:  Heather Mcintyre, DOB 08-Apr-1966, MRN [id number]  PCP:  Lorel Maxie LABOR, MD  Cardiologist:  Alm Clay, MD  Nephrologist:  Referring MD: Lorel Maxie LABOR, MD   CC: Hypertension  History of Present Illness:    Heather Mcintyre is a 58 y.o. female with a hx of hypertension, LVH, LE edema, prediabetes, obesity, bradycardia, (cancer s/p lumpectomy and sentinel node biopsy 12//23 here to follow up in the [facility].   Referred by Dr. Clay for uncontrolled hypertension. Prior 12/2023 renal artery duplex with poor image quality. Reviewed by Dr. Darron who recommended CT for further evaluation which she deferred. Previously intolerance to multiple antihypertensives predominantly related to dye. She follows with Dr. Tobie of [facility].  Established with [facility] 05/01/24.  Irbesartan  was increased from 150 to 300 mg daily.  Testing with no hyperaldosteronism, normal TSH.  07/2024 spironolactone  25mg  daily initiated  Seen 09/12/24 by Allean Mink, PharmD. Zepbound  initiated for weihgt loss.  Presents today for follow-up independently.  While on Zepbound  2.5 mg weekly she has seen decrease in her weight from 283 ? 278 lbs.  Continues to walk for exercise. 2 week history of RLE pinky toe with numbness radiating to her heel. She also notes some tension in her right neck. She does see pain management and has had epidural which resolved similar symptoms on her left side. No known injury. Reports BP at home has been well controlled.   Previous antihypertensives: Metoprolol  - changed to carvedilol  Valsartan  - trouble with dye Lisinopril - cough Hydrochlorothiazide  - itching  Amlodipine   - poor reaction Losartan - poor reaction  Secondary Causes of Hypertension  Medications/Herbal: OCP, steroids, stimulants, antidepressants, weight loss medication, immune suppressants, NSAIDs,  sympathomimetics, alcohol, caffeine, licorice, ginseng, St. John's wort, chemo  Sleep Apnea Renal artery stenosis Hyperaldosteronism Hyper/hypothyroidism Pheochromocytoma: palpitations, tachycardia, headache, diaphoresis (plasma metanephrines) Cushing's syndrome: Cushingoid facies, central obesity, proximal muscle weakness, and ecchymoses, adrenal incidentaloma (cortisol) Coarctation of the aorta  Past Medical History:  Diagnosis Date   Allergy  30 years ago   Anxiety    Asthma 40 years ago   Breast cancer (HCC)    Cervical cancer (HCC) 20 years ago   Complication of anesthesia    woke up in the middle of surgery 2010   Degenerative joint disease/osteoarthritis 30 years ago   Depression 30 years ago   GERD (gastroesophageal reflux disease) 10 years ago   Hypertension 30 years ago   Lupus    Personal history of radiation therapy    Pre-diabetes     Past Surgical History:  Procedure Laterality Date   ABDOMINAL HYSTERECTOMY  12 years ago   BREAST BIOPSY Left 10/04/2022   US  LT BREAST BX W LOC DEV 1ST LESION IMG BX SPEC US  GUIDE 10/04/2022 [facility]   BREAST LUMPECTOMY,RADIO FREQ LOCALIZER,AXILLARY SENTINEL LYMPH NODE BIOPSY Left 11/07/2022   Procedure: BREAST LUMPECTOMY,RADIO FREQ LOCALIZER,AXILLARY SENTINEL LYMPH NODE BIOPSY;  Surgeon: Desiderio Schanz, MD;  Location: [facility];  Service: General;  Laterality: Left;   CHOLECYSTECTOMY N/A 20 years ago   COLONOSCOPY WITH ESOPHAGOGASTRODUODENOSCOPY (EGD)     INCISION AND DRAINAGE ABSCESS Left 04/11/2023   Procedure: INCISION AND DRAINAGE ABSCESS, breast;  Surgeon: Desiderio Schanz, MD;  Location: [facility];  Service: General;  Laterality: Left;   JOINT REPLACEMENT  15 years ago   LAPAROSCOPIC GASTRIC BAND REMOVAL WITH LAPAROSCOPIC GASTRIC SLEEVE RESECTION  REPLACEMENT TOTAL KNEE BILATERAL     TUBAL LIGATION  36 years ago    Current Medications: Current Meds  Medication Sig   acetaminophen  (TYLENOL ) 500 MG tablet Take 2  tablets (1,000 mg total) by mouth every 6 (six) hours as needed for mild pain.   ALOE VERA PO Take 1 tablet by mouth daily. Natural aloe   anastrozole  (ARIMIDEX ) 1 MG tablet Take 1 tablet (1 mg total) by mouth daily.   ascorbic acid  (VITAMIN C ) 500 MG tablet Take 500 mg by mouth daily.   azelastine  (ASTELIN ) 0.1 % nasal spray Place 2 sprays into both nostrils 2 (two) times daily as needed for rhinitis or allergies.   B COMPLEX VITAMINS PO Take 1 tablet by mouth daily.   carvedilol  (COREG ) 12.5 MG tablet Take 1 tablet (12.5 mg total) by mouth 2 (two) times daily.   celecoxib  (CELEBREX ) 200 MG capsule Take 1 capsule (200 mg total) by mouth 2 (two) times daily as needed for moderate pain.   cetirizine  (ZYRTEC ) 10 MG tablet Take 1 tablet (10 mg total) by mouth 2 (two) times daily as needed for allergies (or hives).   colchicine  0.6 MG tablet Take 0.6 mg by mouth as needed.   cyclobenzaprine (FLEXERIL) 5 MG tablet Take 5 mg by mouth 3 (three) times daily as needed.   desonide  (DESOWEN ) 0.05 % cream Apply topically to red and itchy areas up to twice a day if needed. Do not use this medication longer than 2 weeks in a row.   diphenhydrAMINE (BENADRYL) 25 mg capsule Take 25 mg by mouth every 6 (six) hours as needed.   fluticasone  (FLONASE ) 50 MCG/ACT nasal spray Place 2 sprays into both nostrils daily.   furosemide (LASIX) 40 MG tablet Take 40 mg by mouth daily as needed.   gabapentin  (NEURONTIN ) 600 MG tablet Take 600 mg by mouth 3 (three) times daily.   hydrOXYzine  (ATARAX ) 25 MG tablet Take 25 mg by mouth 4 (four) times daily.   irbesartan  (AVAPRO ) 300 MG tablet Take 1 tablet (300 mg total) by mouth daily.   levocetirizine (XYZAL) 5 MG tablet Take 5 mg by mouth daily.   linaclotide  (LINZESS ) 290 MCG CAPS capsule Take 290 mcg by mouth daily before breakfast.   montelukast  (SINGULAIR ) 10 MG tablet Take 1 tablet (10 mg total) by mouth at bedtime.   omeprazole (PRILOSEC) 40 MG capsule PRN   pimecrolimus   (ELIDEL ) 1 % cream Apply topically 2 (two) times daily.   rosuvastatin  (CRESTOR ) 20 MG tablet Take 1 tablet (20 mg total) by mouth daily.   spironolactone  (ALDACTONE ) 25 MG tablet Take 1 tablet (25 mg total) by mouth daily.   tirzepatide  (ZEPBOUND ) 5 MG/0.5ML Pen Inject 5 mg into the skin once a week.   tiZANidine  (ZANAFLEX ) 4 MG tablet Take 4 mg by mouth every 8 (eight) hours as needed for muscle spasms.   traZODone  (DESYREL ) 50 MG tablet Take 1 tablet (50 mg total) by mouth at bedtime.   VENTOLIN HFA 108 (90 Base) MCG/ACT inhaler Inhale 2 puffs into the lungs every 6 (six) hours as needed.   XIIDRA 5 % SOLN Apply 1 drop to eye 2 (two) times daily.     Allergies:   Cephalexin, Etodolac, Iodinated contrast media, Latex, Fish protein-containing drug products, Hydrochlorothiazide , Iodine, Penicillins, Shellfish allergy , and Lisinopril   Social History   Socioeconomic History   Marital status: Divorced    Spouse name: Not on file   Number of children: Not on file  Years of education: Not on file   Highest education level: Not on file  Occupational History   Occupation: Teacher    Comment: [employer]  Tobacco Use   Smoking status: Never    Passive exposure: Never   Smokeless tobacco: Never  Vaping Use   Vaping status: Never Used  Substance and Sexual Activity   Alcohol use: No   Drug use: Never   Sexual activity: Not Currently    Birth control/protection: None  Other Topics Concern   Not on file  Social History Narrative   Divorced.  Lives alone.  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   Moved from [place] [facility] [place] to [place] to help her son and daughter-in-law with 3 grandchildren.   2 cups coffee a day.  No alcohol.  Non-smoker.  No exercise.   Social Drivers of Health   Tobacco Use: Low Risk (11/06/2024)   Patient History    Smoking Tobacco Use: Never    Smokeless Tobacco Use:  Never    Passive Exposure: Never  Financial Resource Strain: Low Risk (05/01/2024)   Overall Financial Resource Strain (CARDIA)    Difficulty of Paying Living Expenses: Not hard at all  Food Insecurity: No Food Insecurity (05/01/2024)   Hunger Vital Sign    Worried About Running Out of Food in the Last Year: Never true    Ran Out of Food in the Last Year: Never true  Transportation Needs: No Transportation Needs (05/01/2024)   PRAPARE - Administrator, Civil Service (Medical): No    Lack of Transportation (Non-Medical): No  Physical Activity: Sufficiently Active (05/01/2024)   Exercise Vital Sign    Days of Exercise per Week: 4 days    Minutes of Exercise per Session: 150+ min  Stress: Stress Concern Present (05/01/2024)   [employer] of Occupational Health - Occupational Stress Questionnaire    Feeling of Stress : To some extent  Social Connections: Moderately Integrated (05/01/2024)   Social Connection and Isolation Panel    Frequency of Communication with Friends and Family: More than three times a week    Frequency of Social Gatherings with Friends and Family: More than three times a week    Attends Religious Services: More than 4 times per year    Active Member of Clubs or Organizations: Yes    Attends Banker Meetings: More than 4 times per year    Marital Status: Divorced  Depression (PHQ2-9): Low Risk (09/22/2024)   Depression (PHQ2-9)    PHQ-2 Score: 0  Alcohol Screen: Low Risk (05/01/2024)   Alcohol Screen    Last Alcohol Screening Score (AUDIT): 0  Housing: Low Risk (05/01/2024)   Housing Stability Vital Sign    Unable to Pay for Housing in the Last Year: No    Number of Times Moved in the Last Year: 0    Homeless in the Last Year: No  Utilities: Not At Risk (05/01/2024)   AHC Utilities    Threatened with loss of utilities: No  Health Literacy: Adequate Health Literacy (05/01/2024)   B1300 Health Literacy    Frequency of need for help with medical  instructions: Never     Family History: The patient's family history includes Hypertension in her mother. There is no history of Breast cancer.  ROS:   Please see the history of present illness.     All other systems reviewed and are negative.  EKGs/Labs/Other Studies Reviewed:        Cardiac Studies & Procedures   ______________________________________________________________________________________________     ECHOCARDIOGRAM  ECHOCARDIOGRAM COMPLETE 01/11/2024  Narrative ECHOCARDIOGRAM REPORT    Patient Name:   Heather Mcintyre Date of Exam: 01/11/2024 Medical Rec #:  [id number]       Height:       68.0 in Accession #:    [id number]      Weight:       278.0 lb Date of Birth:  1966-04-23      BSA:          2.351 m Patient Age:    57 years        BP:           158/80 mmHg Patient Gender: F               HR:           58 bpm. Exam Location:  [facility]  Procedure: 2D Echo, 3D Echo, Cardiac Doppler and Color Doppler (Both Spectral and Color Flow Doppler were utilized during procedure).  Indications:    R00.1 Bradycardia, unspecified; R07.9* Chest pain, unspecified  History:        Patient has no prior history of Echocardiogram examinations. Arrythmias:Bradycardia, Signs/Symptoms:Chest Pain and Shortness of Breath; Risk Factors:Non-Smoker.  Sonographer:    Arley Pac RDMS, RVT, RDCS Referring Phys: [id number] DAVID W HARDING  IMPRESSIONS   1. Left ventricular ejection fraction, by estimation, is 55 to 60%. The left ventricle has normal function. The left ventricle has no regional wall motion abnormalities. Left ventricular diastolic parameters were normal. 2. Right ventricular systolic function is normal. The right ventricular size is normal. 3. The mitral valve is normal in structure. Mild mitral valve regurgitation. 4. The aortic valve is tricuspid. Aortic valve regurgitation is not visualized. 5. The inferior vena cava is normal in size with greater than 50% respiratory  variability, suggesting right atrial pressure of 3 mmHg.  FINDINGS Left Ventricle: Left ventricular ejection fraction, by estimation, is 55 to 60%. The left ventricle has normal function. The left ventricle has no regional wall motion abnormalities. Strain imaging was not performed. The left ventricular internal cavity size was normal in size. There is no left ventricular hypertrophy. Left ventricular diastolic parameters were normal.  Right Ventricle: The right ventricular size is normal. No increase in right ventricular wall thickness. Right ventricular systolic function is normal.  Left Atrium: Left atrial size was normal in size.  Right Atrium: Right atrial size was normal in size.  Pericardium: There is no evidence of pericardial effusion.  Mitral Valve: The mitral valve is normal in structure. Mild mitral valve regurgitation.  Tricuspid Valve: The tricuspid valve is normal in structure. Tricuspid valve regurgitation is mild.  Aortic Valve: The aortic valve is tricuspid. Aortic valve regurgitation is not visualized. Aortic valve mean gradient measures 2.0 mmHg. Aortic valve peak gradient measures 4.5 mmHg. Aortic valve area, by VTI measures 3.28 cm.  Pulmonic Valve: The pulmonic valve was not well visualized. Pulmonic valve regurgitation is not visualized.  Aorta: The aortic root and ascending aorta are structurally normal, with no evidence of dilitation.  Venous: The inferior vena cava is normal in size with greater than 50% respiratory variability, suggesting right atrial pressure of 3 mmHg.  IAS/Shunts: No atrial level shunt detected by color flow Doppler.  Additional Comments: 3D was performed not requiring image post processing on an independent workstation and was indeterminate.   LEFT VENTRICLE PLAX 2D  LVIDd:         5.10 cm      Diastology LVIDs:         3.30 cm      LV e' medial:    7.07 cm/s LV PW:         0.90 cm      LV E/e' medial:  13.0 LV IVS:        1.00 cm       LV e' lateral:   10.70 cm/s LVOT diam:     2.10 cm      LV E/e' lateral: 8.6 LV SV:         88 LV SV Index:   38 LVOT Area:     3.46 cm  3D Volume EF: LV Volumes (MOD)            3D EF:        59 % LV vol d, MOD A2C: 144.0 ml LV EDV:       237 ml LV vol d, MOD A4C: 119.0 ml LV ESV:       97 ml LV vol s, MOD A2C: 68.8 ml  LV SV:        140 ml LV vol s, MOD A4C: 55.3 ml LV SV MOD A2C:     75.2 ml LV SV MOD A4C:     119.0 ml LV SV MOD BP:      67.4 ml  RIGHT VENTRICLE             IVC RV Basal diam:  3.90 cm     IVC diam: 1.40 cm RV S prime:     12.00 cm/s TAPSE (M-mode): 2.8 cm  LEFT ATRIUM             Index        RIGHT ATRIUM           Index LA diam:        3.80 cm 1.62 cm/m   RA Area:     18.40 cm LA Vol (A2C):   82.2 ml 34.97 ml/m  RA Volume:   52.30 ml  22.25 ml/m LA Vol (A4C):   55.8 ml 23.74 ml/m LA Biplane Vol: 67.7 ml 28.80 ml/m AORTIC VALVE                    PULMONIC VALVE AV Area (Vmax):    3.66 cm     PV Vmax:       0.84 m/s AV Area (Vmean):   3.41 cm     PV Peak grad:  2.8 mmHg AV Area (VTI):     3.28 cm AV Vmax:           106.00 cm/s AV Vmean:          71.900 cm/s AV VTI:            0.269 m AV Peak Grad:      4.5 mmHg AV Mean Grad:      2.0 mmHg LVOT Vmax:         112.00 cm/s LVOT Vmean:        70.700 cm/s LVOT VTI:          0.255 m LVOT/AV VTI ratio: 0.95  AORTA Ao Root diam: 3.10 cm Ao Asc diam:  3.00 cm Ao Arch diam: 2.9 cm  MITRAL VALVE               TRICUSPID VALVE MV Area (PHT): 3.65  cm    TR Peak grad:   22.7 mmHg MV Decel Time: 208 msec    TR Vmax:        238.00 cm/s MV E velocity: 91.70 cm/s MV A velocity: 66.30 cm/s  SHUNTS MV E/A ratio:  1.38        Systemic VTI:  0.26 m Systemic Diam: 2.10 cm  Redell Cave MD Electronically signed by Redell Cave MD Signature Date/Time: 01/11/2024/5:57:21 PM    Final          ______________________________________________________________________________________________         Recent Labs: 12/27/2023: Hemoglobin 13.4; Platelets 294 01/09/2024: Magnesium 1.8 03/20/2024: ALT 15 08/29/2024: BUN 21; Creatinine, Ser 1.14; Potassium 4.4; Sodium 140; TSH 2.560   Recent Lipid Panel    Component Value Date/Time   CHOL 177 03/20/2024 1028   TRIG 98 03/20/2024 1028   HDL 47 03/20/2024 1028   CHOLHDL 3.8 03/20/2024 1028   LDLCALC 112 (H) 03/20/2024 1028    Physical Exam:   VS:  BP 122/78   Pulse (!) 50   Ht 5' 8 (1.727 m)   Wt 278 lb 14.4 oz (126.5 kg)   SpO2 93%   BMI 42.41 kg/m  , BMI Body mass index is 42.41 kg/m. GENERAL:  Well appearing, overweight HEENT: Pupils equal round and reactive, fundi not visualized, oral mucosa unremarkable NECK:  No jugular venous distention, waveform within normal limits, carotid upstroke brisk and symmetric, no bruits, no thyromegaly LYMPHATICS:  No cervical adenopathy LUNGS:  Clear to auscultation bilaterally HEART:  RRR.  PMI not displaced or sustained,S1 and S2 within normal limits, no S3, no S4, no clicks, no rubs, no murmurs ABD:  Flat, positive bowel sounds normal in frequency in pitch, no bruits, no rebound, no guarding, no midline pulsatile mass, no hepatomegaly, no splenomegaly EXT:  2 plus pulses throughout, no edema, no cyanosis no clubbing SKIN:  No rashes no nodules NEURO:  Cranial nerves II through XII grossly intact, motor grossly intact throughout PSYCH:  Cognitively intact, oriented to person place and time   ASSESSMENT/PLAN:    HTN - BP at goal <130/80.  Multiple prior intolerances, listed above. Requires dye-free medications.  Continue present antihypertensive regimen carvedilol  12.5 mg twice daily, irbesartan  300 mg daily, spironolactone  25 mg daily. Update BMP today.  If renal function remains decreased from previous consider reducing spironolactone  from 25 to 12.5 mg daily. Discussed to monitor BP at home at least 2 hours after medications and sitting for 5-10 minutes. Encouraged to bring BP cuff to  next office visit to assess for accuracy as BP higher at home than in clinic. If BP persistently difficult to control could consider CTA abd/pelvis for evaluation of renal artery stenosis (ultrasound nondiagnostic) but would require premedication due to iodine allergy .  OSA - CPAP compliance encouraged.   RLE pain - plans to follow up with pain management clinic  Screening for Secondary Hypertension:     08/18/2024    3:20 PM  Causes  Drugs/Herbals Screened     - Comments high salt diet.  1 Coke daily.  No EtOH.  no tobacco.  +celebrex .  Sleep Apnea Screened     - Comments not using    Relevant Labs/Studies:    Latest Ref Rng & Units 08/29/2024    9:56 AM 03/20/2024   10:28 AM 12/27/2023   11:08 AM  Basic Labs  Sodium 134 - 144 mmol/L 140  142  140   Potassium 3.5 - 5.2 mmol/L 4.4  4.3  3.9   Creatinine 0.57 - 1.00 mg/dL 8.85  9.18  9.06        Latest Ref Rng & Units 08/29/2024    9:56 AM  Thyroid   TSH 0.450 - 4.500 uIU/mL 2.560        Latest Ref Rng & Units 08/29/2024    9:56 AM  Renin/Aldosterone   Aldosterone 0.0 - 30.0 ng/dL 89.2   Aldos/Renin Ratio 0.0 - 30.0 52.5              01/11/2024    8:45 AM  Renovascular   Renal Artery US  Completed Yes      Disposition:    FU with Dr. Anner in 3 months and with [facility] Clinic in 6-9 months   Medication Adjustments/Labs and Tests Ordered: Current medicines are reviewed at length with the patient today.  Concerns regarding medicines are outlined above.  No orders of the defined types were placed in this encounter.  No orders of the defined types were placed in this encounter.    Signed, Reche GORMAN Finder, NP  11/06/2024 2:38 PM    [facility]

## 2024-11-07 ENCOUNTER — Ambulatory Visit (HOSPITAL_BASED_OUTPATIENT_CLINIC_OR_DEPARTMENT_OTHER): Payer: Self-pay | Admitting: Family

## 2024-11-12 ENCOUNTER — Encounter: Payer: Self-pay | Admitting: Pharmacist Clinician (PhC)/ Clinical Pharmacy Specialist

## 2024-11-22 ENCOUNTER — Other Ambulatory Visit: Payer: Self-pay | Admitting: Cardiology

## 2024-11-24 MED ORDER — ZEPBOUND 7.5 MG/0.5ML ~~LOC~~ SOAJ: 7.5000 mg | SUBCUTANEOUS | 0 refills | Status: DC

## 2024-11-24 NOTE — Addendum Note (Signed)
 Addended by: DARRELL BRUCKNER on: 11/24/2024 08:48 AM   Modules accepted: Orders

## 2024-12-19 ENCOUNTER — Ambulatory Visit: Admitting: Anesthesiology

## 2024-12-19 ENCOUNTER — Encounter: Payer: Self-pay | Admitting: Gastroenterology

## 2024-12-19 ENCOUNTER — Other Ambulatory Visit: Payer: Self-pay

## 2024-12-19 ENCOUNTER — Encounter
Admission: RE | Disposition: A | Discharge status: Home / Self Care | Payer: Self-pay | Source: Home / Self Care | Attending: Gastroenterology

## 2024-12-19 ENCOUNTER — Telehealth: Payer: Self-pay | Admitting: Cardiology

## 2024-12-19 ENCOUNTER — Ambulatory Visit
Admission: RE | Admit: 2024-12-19 | Discharge: 2024-12-19 | Disposition: A | Discharge status: Home / Self Care | Attending: Gastroenterology | Admitting: Gastroenterology

## 2024-12-19 DIAGNOSIS — K6389 Other specified diseases of intestine: Secondary | ICD-10-CM | POA: Insufficient documentation

## 2024-12-19 DIAGNOSIS — Z1211 Encounter for screening for malignant neoplasm of colon: Secondary | ICD-10-CM | POA: Insufficient documentation

## 2024-12-19 DIAGNOSIS — L814 Other melanin hyperpigmentation: Secondary | ICD-10-CM | POA: Insufficient documentation

## 2024-12-19 MED ORDER — ZEPBOUND 7.5 MG/0.5ML ~~LOC~~ SOAJ: 7.5000 mg | SUBCUTANEOUS | 0 refills | Status: AC

## 2024-12-19 MED ORDER — LIDOCAINE HCL (PF) 2 % IJ SOLN
INTRAMUSCULAR | Status: AC
  Filled 2024-12-19: qty 5

## 2024-12-19 MED ORDER — SODIUM CHLORIDE 0.9 % IV SOLN: INTRAVENOUS | Status: DC

## 2024-12-19 MED ORDER — PROPOFOL 500 MG/50ML IV EMUL
INTRAVENOUS | Status: DC | PRN
  Administered 2024-12-19: 100 mg via INTRAVENOUS
  Administered 2024-12-19: 100 ug/kg/min via INTRAVENOUS

## 2024-12-19 MED ORDER — MIDAZOLAM HCL (PF) 2 MG/2ML IJ SOLN
INTRAMUSCULAR | Status: DC | PRN
  Administered 2024-12-19: 2 mg via INTRAVENOUS

## 2024-12-19 MED ORDER — MIDAZOLAM HCL 2 MG/2ML IJ SOLN
INTRAMUSCULAR | Status: AC
  Filled 2024-12-19: qty 2

## 2024-12-19 MED ORDER — DEXMEDETOMIDINE HCL IN NACL 200 MCG/50ML IV SOLN
INTRAVENOUS | Status: DC | PRN
  Administered 2024-12-19 (×2): 4 ug via INTRAVENOUS

## 2024-12-19 MED ORDER — PROPOFOL 1000 MG/100ML IV EMUL
INTRAVENOUS | Status: AC
  Filled 2024-12-19: qty 100

## 2024-12-19 NOTE — Transfer of Care (Signed)
 Immediate Anesthesia Transfer of Care Note  Patient: Heather Mcintyre  Procedure(s) Performed: COLONOSCOPY  Patient Location: PACU  Anesthesia Type:General  Level of Consciousness: awake, alert , and oriented  Airway & Oxygen Therapy: Patient Spontanous Breathing  Post-op Assessment: Report given to RN and Post -op Vital signs reviewed and stable  Post vital signs: stable  Last Vitals:  Vitals Value Taken Time  BP    Temp    Pulse    Resp    SpO2      Last Pain:  Vitals:   12/19/24 0916  TempSrc: Temporal  PainSc: 0-No pain         Complications: No notable events documented.

## 2024-12-19 NOTE — Anesthesia Preprocedure Evaluation (Signed)
"                                    Anesthesia Evaluation  Patient identified by MRN, date of birth, ID band Patient awake  General Assessment Comment:Pt woke up during knee surgery (spinal/ block)  Reviewed: Allergy  & Precautions, NPO status , Patient's Chart, lab work & pertinent test results  History of Anesthesia Complications (+) AWARENESS UNDER ANESTHESIA and history of anesthetic complications  Airway Mallampati: III   Neck ROM: Full    Dental  (+) Partial Upper, Missing   Pulmonary asthma    Pulmonary exam normal breath sounds clear to auscultation       Cardiovascular hypertension, Normal cardiovascular exam Rhythm:Regular Rate:Normal  ECG 01/15/23: Sinus bradycardia (HR 54), otherwise normal   Neuro/Psych  PSYCHIATRIC DISORDERS Anxiety Depression    negative neurological ROS     GI/Hepatic ,GERD  ,,  Endo/Other  Prediabetes; class 3 obesity  Renal/GU negative Renal ROS     Musculoskeletal  (+) Arthritis ,  Gout    Abdominal   Peds  Hematology Lupus; breast CA; cervical CA   Anesthesia Other Findings Cardiology note 12/13/22:  Cardiology Problems    Essential hypertension (Chronic)      Blood pressure looks well-controlled today she is taking 50 mg Toprol  which could explain her bradycardia.  She is also taking Lasix as needed maybe once or twice a week.      Other   Generalized edema      Not really associated PND or orthopnea.  Nothing does really suggest CHF.  She takes PRN Lasix once twice a week.     Bradycardia following surgery - Primary      She had an EKG that showed the heart rate of 49 bpm and apparently has had lower than usual heart rate.  She is on moderate dose Toprol  which could be the reason.   Plan: 7-day ZIO patch monitor to determine heart rate range.     Morbid obesity with BMI of 40.0-44.9, adult (HCC) (Chronic)      I was wondering if maybe some of her fatigue issues could be related to OSA, the ceiling is more  related to poor sleep because of pain and nocturia.   Certainly weight loss will help most of her musculoskeletal pain issues.   Discussed diet and exercise recommendations.    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  This is injection with and letrozole    Will discussed with Cardio Oncology-Dr. Ronal Ross.  Will determine whether or not she needs reevaluation with 2D echo.   Reproductive/Obstetrics                              Anesthesia Physical Anesthesia Plan  ASA: 3  Anesthesia Plan: General   Post-op Pain Management:    Induction: Intravenous  PONV Risk Score and Plan: 3 and TIVA and Propofol  infusion  Airway Management Planned: Natural Airway  Additional Equipment:   Intra-op Plan:   Post-operative Plan:   Informed Consent:      Dental advisory given  Plan Discussed with: CRNA  Anesthesia Plan Comments:          Anesthesia Quick Evaluation  "

## 2024-12-19 NOTE — Op Note (Signed)
 [facility] Patient Name: Heather Mcintyre Procedure Date: 12/19/2024 9:47 AM MRN: [id number] Account #: [id number] Date of Birth: [birth year] Admit Type: Outpatient Age: 59 Room: [facility] (2[facility]Rh) ENDO ROOM 4 Gender: Female Note Status: Finalized Instrument Name: Colon Scope [id number] Procedure:             Colonoscopy Indications:           Screening for colorectal malignant neoplasm, This is                         the patient's first colonoscopy Providers:             Corinn Jess Brooklyn MD, MD Referring MD:          Maxie LABOR. Aycock MD (Referring MD) Medicines:             General Anesthesia Complications:         No immediate complications. Estimated blood loss: None. Procedure:             Pre-Anesthesia Assessment:                        - Prior to the procedure, a History and Physical was                         performed, and patient medications and allergies were                         reviewed. The patient is competent. The risks and                         benefits of the procedure and the sedation options and                         risks were discussed with the patient. All questions                         were answered and informed consent was obtained.                         Patient identification and proposed procedure were                         verified by the physician, the nurse, the                         anesthesiologist, the anesthetist and the technician                         in the pre-procedure area in the procedure room in the                         endoscopy suite. Mental Status Examination: alert and                         oriented. Airway Examination: normal oropharyngeal                         airway and neck mobility. Respiratory Examination:  clear to auscultation. CV Examination: normal.                         Prophylactic Antibiotics: The patient does not require                          prophylactic antibiotics. Prior Anticoagulants: The                         patient has taken no anticoagulant or antiplatelet                         agents. ASA Grade Assessment: III - A patient with                         severe systemic disease. After reviewing the risks and                         benefits, the patient was deemed in satisfactory                         condition to undergo the procedure. The anesthesia                         plan was to use general anesthesia. Immediately prior                         to administration of medications, the patient was                         re-assessed for adequacy to receive sedatives. The                         heart rate, respiratory rate, oxygen saturations,                         blood pressure, adequacy of pulmonary ventilation, and                         response to care were monitored throughout the                         procedure. The physical status of the patient was                         re-assessed after the procedure.                        After obtaining informed consent, the colonoscope was                         passed under direct vision. Throughout the procedure,                         the patient's blood pressure, pulse, and oxygen                         saturations were monitored continuously. The  Colonoscope was introduced through the anus and                         advanced to the the cecum, identified by appendiceal                         orifice and ileocecal valve. The colonoscopy was                         extremely difficult due to significant looping and the                         patient's body habitus. Successful completion of the                         procedure was aided by applying abdominal pressure.                         The patient tolerated the procedure well. The quality                         of the bowel preparation was evaluated using the BBPS                          [facility] Bowel Preparation Scale) with scores of: Right                         Colon = 3, Transverse Colon = 3 and Left Colon = 3                         (entire mucosa seen well with no residual staining,                         small fragments of stool or opaque liquid). The total                         BBPS score equals 9. The ileocecal valve, appendiceal                         orifice, and rectum were photographed. Findings:      The perianal and digital rectal examinations were normal. Pertinent       negatives include normal sphincter tone and no palpable rectal lesions.      A diffuse area of severe melanosis was found in the entire colon.      The retroflexed view of the distal rectum and anal verge was normal and       showed no anal or rectal abnormalities. Impression:            - Melanosis in the colon.                        - The distal rectum and anal verge are normal on                         retroflexion view.                        -  No specimens collected. Recommendation:        - Discharge patient to home (with escort).                        - Resume previous diet today.                        - Continue present medications.                        - Repeat colonoscopy in 10 years for screening                         purposes. Procedure Code(s):     --- Professional ---                        H9878, Colorectal cancer screening; colonoscopy on                         individual not meeting criteria for high risk Diagnosis Code(s):     --- Professional ---                        Z12.11, Encounter for screening for malignant neoplasm                         of colon                        K63.89, Other specified diseases of intestine CPT copyright 2022 American Medical Association. All rights reserved. The codes documented in this report are preliminary and upon coder review may  be revised to meet current compliance requirements. Dr. Corinn Brooklyn Corinn Jess Brooklyn MD, MD 12/19/2024 10:19:07 AM This report has been signed electronically. Number of Addenda: 0 Note Initiated On: 12/19/2024 9:47 AM Scope Withdrawal Time: 0 hours 6 minutes 43 seconds  Total Procedure Duration: 0 hours 18 minutes 6 seconds  Estimated Blood Loss:  Estimated blood loss: none.      [facility]

## 2024-12-19 NOTE — Telephone Encounter (Signed)
" °*  STAT* If patient is at the pharmacy, call can be transferred to refill team.   1. Which medications need to be refilled? (please list name of each medication and dose if known) tirzepatide  (ZEPBOUND ) 7.5 MG/0.5ML Pen    2. Would you like to learn more about the convenience, safety, & potential cost savings by using the [facility]? No    3. Are you open to using the [facility] (Type [facility].  ). No    4. Which pharmacy/location (including street and city if local pharmacy) is medication to be sent to?[id number] - [place], [place] - [address] AT [facility] [address]    5. Do they need a 30 day or 90 day supply?   "

## 2024-12-19 NOTE — H&P (Signed)
 "   Heather JONELLE Brooklyn, MD [facility], [address]IP [address]  [place], [place] [address]  Main: [phone] Fax:  [phone] Pager: [phone]   Primary Care Physician:  Lorel Maxie LABOR, MD Primary Gastroenterologist:  Dr. Corinn JONELLE Mcintyre  Pre-Procedure History & Physical: HPI:  Heather Mcintyre is a 59 y.o. female is here for an colonoscopy.   Past Medical History:  Diagnosis Date   Allergy  30 years ago   Anxiety    Asthma 40 years ago   Breast cancer (HCC)    Cervical cancer (HCC) 20 years ago   Complication of anesthesia    woke up in the middle of surgery 2010   Degenerative joint disease/osteoarthritis 30 years ago   Depression 30 years ago   GERD (gastroesophageal reflux disease) 10 years ago   Hypertension 30 years ago   Lupus    Personal history of radiation therapy    Pre-diabetes     Past Surgical History:  Procedure Laterality Date   ABDOMINAL HYSTERECTOMY  12 years ago   BREAST BIOPSY Left 10/04/2022   US  LT BREAST BX W LOC DEV 1ST LESION IMG BX SPEC US  GUIDE 10/04/2022 [facility]   BREAST LUMPECTOMY,RADIO FREQ LOCALIZER,AXILLARY SENTINEL LYMPH NODE BIOPSY Left 11/07/2022   Procedure: BREAST LUMPECTOMY,RADIO FREQ LOCALIZER,AXILLARY SENTINEL LYMPH NODE BIOPSY;  Surgeon: Desiderio Schanz, MD;  Location: [facility];  Service: General;  Laterality: Left;   CHOLECYSTECTOMY N/A 20 years ago   COLONOSCOPY WITH ESOPHAGOGASTRODUODENOSCOPY (EGD)     INCISION AND DRAINAGE ABSCESS Left 04/11/2023   Procedure: INCISION AND DRAINAGE ABSCESS, breast;  Surgeon: Desiderio Schanz, MD;  Location: [facility] ORS;  Service: General;  Laterality: Left;   JOINT REPLACEMENT  15 years ago   LAPAROSCOPIC GASTRIC BAND REMOVAL WITH LAPAROSCOPIC GASTRIC SLEEVE RESECTION     REPLACEMENT TOTAL KNEE BILATERAL     TUBAL LIGATION  36 years ago    Prior to Admission medications  Medication Sig Start Date End Date Taking? Authorizing Provider  carvedilol  (COREG ) 12.5 MG  tablet Take 1 tablet (12.5 mg total) by mouth 2 (two) times daily. 10/08/24 01/06/25 Yes Raford Riggs, MD  montelukast  (SINGULAIR ) 10 MG tablet Take 1 tablet (10 mg total) by mouth at bedtime. 02/19/24  Yes Tobie Arleta SQUIBB, MD  spironolactone  (ALDACTONE ) 25 MG tablet Take 1 tablet (25 mg total) by mouth daily. 10/08/24 01/06/25 Yes Raford Riggs, MD  tirzepatide  (ZEPBOUND ) 7.5 MG/0.5ML Pen Inject 7.5 mg into the skin once a week. 11/24/24  Yes Anner Alm ORN, MD  acetaminophen  (TYLENOL ) 500 MG tablet Take 2 tablets (1,000 mg total) by mouth every 6 (six) hours as needed for mild pain. 11/07/22   Piscoya, Schanz, MD  ALOE VERA PO Take 1 tablet by mouth daily. Natural aloe    [provider]  anastrozole  (ARIMIDEX ) 1 MG tablet Take 1 tablet (1 mg total) by mouth daily. 02/13/24   Jacobo Evalene PARAS, MD  ascorbic acid  (VITAMIN C ) 500 MG tablet Take 500 mg by mouth daily.    [provider]  azelastine  (ASTELIN ) 0.1 % nasal spray Place 2 sprays into both nostrils 2 (two) times daily as needed for rhinitis or allergies. 02/19/24   Tobie Arleta SQUIBB, MD  B COMPLEX VITAMINS PO Take 1 tablet by mouth daily.    [provider]  celecoxib  (CELEBREX ) 200 MG capsule Take 1 capsule (200 mg total) by mouth 2 (two) times daily as needed for moderate pain. 11/07/22   Desiderio Schanz, MD  cetirizine  (ZYRTEC ) 10 MG tablet Take 1 tablet (10 mg total) by mouth 2 (two) times daily as needed for allergies (or hives). 02/19/24   Tobie Arleta SQUIBB, MD  colchicine  0.6 MG tablet Take 0.6 mg by mouth as needed. 04/29/24   [provider]  cyclobenzaprine (FLEXERIL) 5 MG tablet Take 5 mg by mouth 3 (three) times daily as needed. 08/22/24   [provider]  desonide  (DESOWEN ) 0.05 % cream Apply topically to red and itchy areas up to twice a day if needed. Do not use this medication longer than 2 weeks in a row. 06/05/24   Cari Arlean HERO, FNP  diphenhydrAMINE (BENADRYL) 25 mg capsule Take 25 mg by  mouth every 6 (six) hours as needed.    [provider]  fluticasone  (FLONASE ) 50 MCG/ACT nasal spray Place 2 sprays into both nostrils daily. 02/19/24   Tobie Arleta SQUIBB, MD  furosemide (LASIX) 40 MG tablet Take 40 mg by mouth daily as needed.    [provider]  gabapentin  (NEURONTIN ) 600 MG tablet Take 600 mg by mouth 3 (three) times daily. 04/29/24   [provider]  hydrOXYzine  (ATARAX ) 25 MG tablet Take 25 mg by mouth 4 (four) times daily. 04/10/24   [provider]  irbesartan  (AVAPRO ) 300 MG tablet Take 1 tablet (300 mg total) by mouth daily. Patient not taking: Reported on 12/19/2024 05/01/24   Walker, Caitlin S, NP  levocetirizine (XYZAL) 5 MG tablet Take 5 mg by mouth daily. 04/24/24   [provider]  linaclotide  (LINZESS ) 290 MCG CAPS capsule Take 290 mcg by mouth daily before breakfast.    [provider]  omeprazole (PRILOSEC) 40 MG capsule PRN 09/13/17   [provider]  pimecrolimus  (ELIDEL ) 1 % cream Apply topically 2 (two) times daily. 05/29/24   Cari Arlean HERO, FNP  rosuvastatin  (CRESTOR ) 20 MG tablet Take 1 tablet (20 mg total) by mouth daily. 10/08/24 01/06/25  Raford Riggs, MD  tiZANidine  (ZANAFLEX ) 4 MG tablet Take 4 mg by mouth every 8 (eight) hours as needed for muscle spasms.    [provider]  traZODone  (DESYREL ) 50 MG tablet Take 1 tablet (50 mg total) by mouth at bedtime. 10/07/24   Reddy, Pallavi D, MD  VENTOLIN HFA 108 (90 Base) MCG/ACT inhaler Inhale 2 puffs into the lungs every 6 (six) hours as needed. 01/29/24   [provider]  XIIDRA 5 % SOLN Apply 1 drop to eye 2 (two) times daily. 05/06/24   [provider]    Allergies as of 12/12/2024 - Review Complete 11/06/2024  Allergen Reaction Noted   Cephalexin Hives and Other (See Comments) 06/26/2016   Etodolac Hives 06/26/2016   Iodinated contrast media Hives 06/26/2016   Latex Hives and Other (See Comments) 06/26/2016   Fish  protein-containing drug products Other (See Comments) 11/24/2022   Hydrochlorothiazide  Itching 02/12/2024   Iodine Hives 11/24/2022   Penicillins  02/10/2017   Shellfish allergy  Hives 01/09/2024   Lisinopril Other (See Comments) and Cough 06/26/2016    Family History  Problem Relation Age of Onset   Hypertension Mother    Breast cancer Neg Hx     Social History   Socioeconomic History   Marital status: Divorced    Spouse name: Not on file   Number of children: Not on file   Years of education: Not on file   Highest education level: Not on file  Occupational History   Occupation: Teacher    Comment: [employer]  Tobacco Use   Smoking status: Never    Passive exposure: Never   Smokeless tobacco: Never  Vaping Use   Vaping status: Never Used  Substance and Sexual Activity   Alcohol use: No   Drug use: Never   Sexual activity: Not Currently    Birth control/protection: None  Other Topics Concern   Not on file  Social History Narrative   Divorced.  Lives alone.  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   Moved from [place] [facility] to [place] to help her son and daughter-in-law with 3 grandchildren.   2 cups coffee a day.  No alcohol.  Non-smoker.  No exercise.   Social Drivers of Health   Tobacco Use: Low Risk (12/19/2024)   Patient History    Smoking Tobacco Use: Never    Smokeless Tobacco Use: Never    Passive Exposure: Never  Financial Resource Strain: Low Risk (05/01/2024)   Overall Financial Resource Strain (CARDIA)    Difficulty of Paying Living Expenses: Not hard at all  Food Insecurity: No Food Insecurity (05/01/2024)   Hunger Vital Sign    Worried About Running Out of Food in the Last Year: Never true    Ran Out of Food in the Last Year: Never true  Transportation Needs: No Transportation Needs (05/01/2024)   PRAPARE - Administrator, Civil Service (Medical): No     Lack of Transportation (Non-Medical): No  Physical Activity: Sufficiently Active (05/01/2024)   Exercise Vital Sign    Days of Exercise per Week: 4 days    Minutes of Exercise per Session: 150+ min  Stress: Stress Concern Present (05/01/2024)   [employer] of Occupational Health - Occupational Stress Questionnaire    Feeling of Stress : To some extent  Social Connections: Moderately Integrated (05/01/2024)   Social Connection and Isolation Panel    Frequency of Communication with Friends and Family: More than three times a week    Frequency of Social Gatherings with Friends and Family: More than three times a week    Attends Religious Services: More than 4 times per year    Active Member of [employer] or Organizations: Yes    Attends Banker Meetings: More than 4 times per year    Marital Status: Divorced  Intimate Partner Violence: Not At Risk (05/01/2024)   Humiliation, Afraid, Rape, and Kick questionnaire    Fear of Current or Ex-Partner: No    Emotionally Abused: No    Physically Abused: No    Sexually Abused: No  Depression (PHQ2-9): Low Risk (09/22/2024)   Depression (PHQ2-9)    PHQ-2 Score: 0  Alcohol Screen: Low Risk (05/01/2024)   Alcohol Screen    Last Alcohol Screening Score (AUDIT): 0  Housing: Low Risk (05/01/2024)   Housing Stability Vital Sign    Unable to Pay for Housing in the Last Year: No    Number of Times Moved in the Last Year: 0    Homeless in the Last Year: No  Utilities: Not At Risk (05/01/2024)   AHC Utilities    Threatened with loss of utilities: No  Health Literacy: Adequate Health Literacy (05/01/2024)   B1300 Health Literacy    Frequency of need for help with medical instructions: Never    Review of Systems: See HPI, otherwise negative ROS  Physical Exam: BP 129/76   Pulse 75   Temp (!) 97.1 F (36.2 C) (Temporal)  Resp 18   Ht 5' 8 (1.727 m)   Wt 123.8 kg   SpO2 98%   BMI 41.51 kg/m  General:   Alert,  pleasant and cooperative  in NAD Head:  Normocephalic and atraumatic. Neck:  Supple; no masses or thyromegaly. Lungs:  Clear throughout to auscultation.    Heart:  Regular rate and rhythm. Abdomen:  Soft, nontender and nondistended. Normal bowel sounds, without guarding, and without rebound.   Neurologic:  Alert and  oriented x4;  grossly normal neurologically.  Impression/Plan: Heather Mcintyre is here for an colonoscopy to be performed for colon cancer screening  Risks, benefits, limitations, and alternatives regarding  colonoscopy have been reviewed with the patient.  Questions have been answered.  All parties agreeable.   Heather Brooklyn, MD  12/19/2024, 9:46 AM "

## 2024-12-19 NOTE — Anesthesia Postprocedure Evaluation (Signed)
"   Anesthesia Post Note  Patient: Heather Mcintyre  Procedure(s) Performed: COLONOSCOPY  Patient location during evaluation: PACU Anesthesia Type: General Level of consciousness: awake and alert Pain management: pain level controlled Vital Signs Assessment: post-procedure vital signs reviewed and stable Respiratory status: spontaneous breathing, nonlabored ventilation, respiratory function stable and patient connected to nasal cannula oxygen Cardiovascular status: blood pressure returned to baseline and stable Postop Assessment: no apparent nausea or vomiting Anesthetic complications: no   No notable events documented.   Last Vitals:  Vitals:   12/19/24 1036 12/19/24 1044  BP: 114/75 (!) 148/87  Pulse: 77 77  Resp: 15 13  Temp:    SpO2: 97% 100%    Last Pain:  Vitals:   12/19/24 1036  TempSrc:   PainSc: 0-No pain                 Shau-Shau Melia      "

## 2025-02-05 ENCOUNTER — Ambulatory Visit: Admitting: Cardiology
